# Patient Record
Sex: Female | Born: 1950
Health system: Southern US, Community
[De-identification: ages and names within clinical notes are randomized; demographics above are authoritative.]

## PROBLEM LIST (undated history)

## (undated) DIAGNOSIS — G4730 Sleep apnea, unspecified: Secondary | ICD-10-CM

## (undated) DIAGNOSIS — T7840XA Allergy, unspecified, initial encounter: Secondary | ICD-10-CM

## (undated) DIAGNOSIS — Z0000 Encounter for general adult medical examination without abnormal findings: Secondary | ICD-10-CM

## (undated) DIAGNOSIS — E7800 Pure hypercholesterolemia, unspecified: Secondary | ICD-10-CM

## (undated) DIAGNOSIS — T8859XA Other complications of anesthesia, initial encounter: Secondary | ICD-10-CM

## (undated) DIAGNOSIS — M1990 Unspecified osteoarthritis, unspecified site: Secondary | ICD-10-CM

## (undated) DIAGNOSIS — M109 Gout, unspecified: Secondary | ICD-10-CM

## (undated) DIAGNOSIS — R42 Dizziness and giddiness: Secondary | ICD-10-CM

## (undated) DIAGNOSIS — M810 Age-related osteoporosis without current pathological fracture: Secondary | ICD-10-CM

## (undated) DIAGNOSIS — R300 Dysuria: Secondary | ICD-10-CM

## (undated) DIAGNOSIS — N189 Chronic kidney disease, unspecified: Secondary | ICD-10-CM

## (undated) DIAGNOSIS — R9431 Abnormal electrocardiogram [ECG] [EKG]: Secondary | ICD-10-CM

## (undated) DIAGNOSIS — D509 Iron deficiency anemia, unspecified: Secondary | ICD-10-CM

## (undated) DIAGNOSIS — R011 Cardiac murmur, unspecified: Secondary | ICD-10-CM

## (undated) DIAGNOSIS — I10 Essential (primary) hypertension: Secondary | ICD-10-CM

## (undated) DIAGNOSIS — J309 Allergic rhinitis, unspecified: Secondary | ICD-10-CM

## (undated) DIAGNOSIS — K219 Gastro-esophageal reflux disease without esophagitis: Secondary | ICD-10-CM

## (undated) HISTORY — DX: Chronic kidney disease, unspecified: N18.9

## (undated) HISTORY — DX: Gastro-esophageal reflux disease without esophagitis: K21.9

## (undated) HISTORY — DX: Iron deficiency anemia, unspecified: D50.9

## (undated) HISTORY — DX: Unspecified osteoarthritis, unspecified site: M19.90

## (undated) HISTORY — DX: Allergy, unspecified, initial encounter: T78.40XA

## (undated) HISTORY — DX: Dysuria: R30.0

## (undated) HISTORY — DX: Pure hypercholesterolemia, unspecified: E78.00

## (undated) HISTORY — DX: Sleep apnea, unspecified: G47.30

## (undated) HISTORY — DX: Encounter for general adult medical examination without abnormal findings: Z00.00

## (undated) HISTORY — DX: Gout, unspecified: M10.9

## (undated) HISTORY — DX: Age-related osteoporosis without current pathological fracture: M81.0

## (undated) HISTORY — DX: Cardiac murmur, unspecified: R01.1

## (undated) HISTORY — DX: Essential (primary) hypertension: I10

## (undated) HISTORY — PX: ABDOMINAL HYSTERECTOMY: SHX81

## (undated) HISTORY — DX: Abnormal electrocardiogram (ECG) (EKG): R94.31

## (undated) HISTORY — PX: EYE SURGERY: SHX253

## (undated) HISTORY — DX: Dizziness and giddiness: R42

## (undated) HISTORY — DX: Allergic rhinitis, unspecified: J30.9

---

## 2000-02-03 ENCOUNTER — Ambulatory Visit (HOSPITAL_COMMUNITY): Admission: RE | Admit: 2000-02-03 | Discharge: 2000-02-03 | Payer: Self-pay | Admitting: Gastroenterology

## 2000-02-03 ENCOUNTER — Encounter: Payer: Self-pay | Admitting: Endocrinology

## 2000-05-24 ENCOUNTER — Encounter: Payer: Self-pay | Admitting: Internal Medicine

## 2000-05-24 ENCOUNTER — Encounter: Admission: RE | Admit: 2000-05-24 | Discharge: 2000-05-24 | Payer: Self-pay | Admitting: Internal Medicine

## 2000-09-23 ENCOUNTER — Other Ambulatory Visit: Admission: RE | Admit: 2000-09-23 | Discharge: 2000-09-23 | Payer: Self-pay | Admitting: Obstetrics and Gynecology

## 2000-10-13 ENCOUNTER — Encounter: Payer: Self-pay | Admitting: Obstetrics and Gynecology

## 2000-10-13 ENCOUNTER — Encounter: Admission: RE | Admit: 2000-10-13 | Discharge: 2000-10-13 | Payer: Self-pay | Admitting: Obstetrics and Gynecology

## 2001-01-01 ENCOUNTER — Emergency Department (HOSPITAL_COMMUNITY): Admission: EM | Admit: 2001-01-01 | Discharge: 2001-01-01 | Payer: Self-pay | Admitting: Emergency Medicine

## 2001-05-06 ENCOUNTER — Emergency Department (HOSPITAL_COMMUNITY): Admission: EM | Admit: 2001-05-06 | Discharge: 2001-05-06 | Payer: Self-pay | Admitting: Emergency Medicine

## 2002-06-16 ENCOUNTER — Emergency Department (HOSPITAL_COMMUNITY): Admission: EM | Admit: 2002-06-16 | Discharge: 2002-06-16 | Payer: Self-pay | Admitting: Emergency Medicine

## 2002-12-24 ENCOUNTER — Other Ambulatory Visit: Admission: RE | Admit: 2002-12-24 | Discharge: 2002-12-24 | Payer: Self-pay | Admitting: Obstetrics and Gynecology

## 2003-08-13 ENCOUNTER — Encounter: Payer: Self-pay | Admitting: Obstetrics and Gynecology

## 2003-08-13 ENCOUNTER — Encounter: Admission: RE | Admit: 2003-08-13 | Discharge: 2003-08-13 | Payer: Self-pay | Admitting: Obstetrics and Gynecology

## 2003-10-22 ENCOUNTER — Encounter: Admission: RE | Admit: 2003-10-22 | Discharge: 2003-10-22 | Payer: Self-pay | Admitting: Internal Medicine

## 2004-03-27 ENCOUNTER — Encounter: Admission: RE | Admit: 2004-03-27 | Discharge: 2004-03-27 | Payer: Self-pay | Admitting: Obstetrics and Gynecology

## 2004-06-25 ENCOUNTER — Other Ambulatory Visit: Admission: RE | Admit: 2004-06-25 | Discharge: 2004-06-25 | Payer: Self-pay | Admitting: Obstetrics and Gynecology

## 2004-08-04 ENCOUNTER — Emergency Department (HOSPITAL_COMMUNITY): Admission: EM | Admit: 2004-08-04 | Discharge: 2004-08-04 | Payer: Self-pay | Admitting: Family Medicine

## 2004-08-07 ENCOUNTER — Emergency Department (HOSPITAL_COMMUNITY): Admission: EM | Admit: 2004-08-07 | Discharge: 2004-08-07 | Payer: Self-pay | Admitting: Family Medicine

## 2005-06-02 ENCOUNTER — Ambulatory Visit: Payer: Self-pay | Admitting: Endocrinology

## 2005-06-09 ENCOUNTER — Ambulatory Visit: Payer: Self-pay | Admitting: Endocrinology

## 2005-06-18 ENCOUNTER — Ambulatory Visit: Payer: Self-pay | Admitting: Internal Medicine

## 2005-07-06 ENCOUNTER — Ambulatory Visit: Payer: Self-pay | Admitting: Endocrinology

## 2005-09-27 ENCOUNTER — Ambulatory Visit: Payer: Self-pay | Admitting: Endocrinology

## 2005-12-09 ENCOUNTER — Ambulatory Visit: Payer: Self-pay | Admitting: Internal Medicine

## 2006-03-11 ENCOUNTER — Encounter: Payer: Self-pay | Admitting: Endocrinology

## 2006-07-11 ENCOUNTER — Ambulatory Visit: Payer: Self-pay | Admitting: Endocrinology

## 2006-07-22 ENCOUNTER — Ambulatory Visit: Payer: Self-pay | Admitting: Endocrinology

## 2007-03-17 ENCOUNTER — Ambulatory Visit: Payer: Self-pay | Admitting: Endocrinology

## 2007-03-17 LAB — CONVERTED CEMR LAB
BUN: 18 mg/dL (ref 6–23)
CO2: 33 meq/L — ABNORMAL HIGH (ref 19–32)
Calcium: 9.4 mg/dL (ref 8.4–10.5)
Chloride: 101 meq/L (ref 96–112)
Creatinine, Ser: 1.2 mg/dL (ref 0.4–1.2)
GFR calc Af Amer: 60 mL/min
GFR calc non Af Amer: 50 mL/min
Glucose, Bld: 102 mg/dL — ABNORMAL HIGH (ref 70–99)
Potassium: 3 meq/L — ABNORMAL LOW (ref 3.5–5.1)
Sed Rate: 44 mm/h — ABNORMAL HIGH (ref 0–25)
Sodium: 142 meq/L (ref 135–145)
Uric Acid, Serum: 8.7 mg/dL — ABNORMAL HIGH (ref 2.4–7.0)

## 2007-04-18 ENCOUNTER — Ambulatory Visit: Payer: Self-pay | Admitting: Endocrinology

## 2007-07-28 ENCOUNTER — Encounter: Payer: Self-pay | Admitting: Endocrinology

## 2007-07-28 DIAGNOSIS — I10 Essential (primary) hypertension: Secondary | ICD-10-CM

## 2007-07-28 DIAGNOSIS — J309 Allergic rhinitis, unspecified: Secondary | ICD-10-CM | POA: Insufficient documentation

## 2007-07-28 DIAGNOSIS — M810 Age-related osteoporosis without current pathological fracture: Secondary | ICD-10-CM

## 2007-07-28 DIAGNOSIS — K219 Gastro-esophageal reflux disease without esophagitis: Secondary | ICD-10-CM

## 2008-10-03 ENCOUNTER — Telehealth (INDEPENDENT_AMBULATORY_CARE_PROVIDER_SITE_OTHER): Payer: Self-pay | Admitting: *Deleted

## 2008-11-01 ENCOUNTER — Ambulatory Visit: Payer: Self-pay | Admitting: Endocrinology

## 2008-11-01 ENCOUNTER — Telehealth (INDEPENDENT_AMBULATORY_CARE_PROVIDER_SITE_OTHER): Payer: Self-pay | Admitting: *Deleted

## 2008-11-01 DIAGNOSIS — E7800 Pure hypercholesterolemia, unspecified: Secondary | ICD-10-CM | POA: Insufficient documentation

## 2008-11-01 DIAGNOSIS — R9431 Abnormal electrocardiogram [ECG] [EKG]: Secondary | ICD-10-CM

## 2008-11-04 ENCOUNTER — Ambulatory Visit: Payer: Self-pay | Admitting: Endocrinology

## 2008-11-04 LAB — CONVERTED CEMR LAB
ALT: 20 units/L (ref 0–35)
AST: 25 units/L (ref 0–37)
Albumin: 3.7 g/dL (ref 3.5–5.2)
Alkaline Phosphatase: 46 units/L (ref 39–117)
BUN: 20 mg/dL (ref 6–23)
Basophils Absolute: 0 10*3/uL (ref 0.0–0.1)
Basophils Relative: 1.1 % (ref 0.0–3.0)
Bilirubin Urine: NEGATIVE
Bilirubin, Direct: 0.1 mg/dL (ref 0.0–0.3)
CO2: 32 meq/L (ref 19–32)
Calcium: 9.1 mg/dL (ref 8.4–10.5)
Chloride: 103 meq/L (ref 96–112)
Cholesterol: 217 mg/dL (ref 0–200)
Creatinine, Ser: 1.2 mg/dL (ref 0.4–1.2)
Direct LDL: 143.4 mg/dL
Eosinophils Absolute: 0.2 10*3/uL (ref 0.0–0.7)
Eosinophils Relative: 4.7 % (ref 0.0–5.0)
GFR calc Af Amer: 60 mL/min
GFR calc non Af Amer: 50 mL/min
Glucose, Bld: 85 mg/dL (ref 70–99)
HCT: 35.4 % — ABNORMAL LOW (ref 36.0–46.0)
HDL: 39.4 mg/dL (ref >39.0)
Hemoglobin, Urine: NEGATIVE
Hemoglobin: 11.9 g/dL — ABNORMAL LOW (ref 12.0–15.0)
Ketones, ur: NEGATIVE mg/dL
LDL Cholesterol: 154 mg/dL — ABNORMAL HIGH (ref 0–99)
Leukocytes, UA: NEGATIVE
Lymphocytes Relative: 50.9 % — ABNORMAL HIGH (ref 12.0–46.0)
MCHC: 33.7 g/dL (ref 30.0–36.0)
MCV: 84.7 fL (ref 78.0–100.0)
Monocytes Absolute: 0.4 10*3/uL (ref 0.1–1.0)
Monocytes Relative: 8 % (ref 3.0–12.0)
Neutro Abs: 1.6 10*3/uL (ref 1.4–7.7)
Neutrophils Relative %: 35.3 % — ABNORMAL LOW (ref 43.0–77.0)
Nitrite: NEGATIVE
Platelets: 345 10*3/uL (ref 150–400)
Potassium: 2.5 meq/L — CL (ref 3.5–5.1)
RBC: 4.18 M/uL (ref 3.87–5.11)
RDW: 14.2 % (ref 11.5–14.6)
Sodium: 143 meq/L (ref 135–145)
Specific Gravity, Urine: 1.015 (ref 1.000–1.03)
TSH: 0.49 microintl units/mL (ref 0.35–5.50)
Total Bilirubin: 0.5 mg/dL (ref 0.3–1.2)
Total CHOL/HDL Ratio: 5.5
Total Protein, Urine: NEGATIVE mg/dL
Total Protein: 7.3 g/dL (ref 6.0–8.3)
Triglycerides: 116 mg/dL (ref 0–149)
Urine Glucose: NEGATIVE mg/dL
Urobilinogen, UA: 0.2 (ref 0.0–1.0)
VLDL: 23 mg/dL (ref 0–40)
WBC: 4.5 10*3/uL (ref 4.5–10.5)
pH: 6.5 (ref 5.0–8.0)

## 2008-11-07 ENCOUNTER — Encounter: Payer: Self-pay | Admitting: Endocrinology

## 2008-11-07 DIAGNOSIS — D509 Iron deficiency anemia, unspecified: Secondary | ICD-10-CM

## 2008-11-07 LAB — CONVERTED CEMR LAB
Basophils Absolute: 0 10*3/uL (ref 0.0–0.1)
Basophils Relative: 0.5 % (ref 0.0–3.0)
Eosinophils Absolute: 0.2 10*3/uL (ref 0.0–0.7)
Eosinophils Relative: 3.3 % (ref 0.0–5.0)
HCT: 32.6 % — ABNORMAL LOW (ref 36.0–46.0)
Hemoglobin: 11 g/dL — ABNORMAL LOW (ref 12.0–15.0)
Iron: 60 ug/dL (ref 42–145)
Lymphocytes Relative: 47.4 % — ABNORMAL HIGH (ref 12.0–46.0)
MCHC: 33.8 g/dL (ref 30.0–36.0)
MCV: 84.9 fL (ref 78.0–100.0)
Monocytes Absolute: 0.4 10*3/uL (ref 0.1–1.0)
Monocytes Relative: 7.9 % (ref 3.0–12.0)
Neutro Abs: 2.3 10*3/uL (ref 1.4–7.7)
Neutrophils Relative %: 40.9 % — ABNORMAL LOW (ref 43.0–77.0)
Platelets: 307 10*3/uL (ref 150–400)
Potassium: 2.8 meq/L — ABNORMAL LOW (ref 3.5–5.1)
RBC: 3.84 M/uL — ABNORMAL LOW (ref 3.87–5.11)
RDW: 14.2 % (ref 11.5–14.6)
Vitamin B-12: 940 pg/mL — ABNORMAL HIGH (ref 211–911)
WBC: 5.6 10*3/uL (ref 4.5–10.5)

## 2009-07-18 ENCOUNTER — Ambulatory Visit: Payer: Self-pay | Admitting: Internal Medicine

## 2009-07-18 DIAGNOSIS — M1A09X0 Idiopathic chronic gout, multiple sites, without tophus (tophi): Secondary | ICD-10-CM

## 2009-07-21 ENCOUNTER — Telehealth: Payer: Self-pay | Admitting: Internal Medicine

## 2009-07-21 ENCOUNTER — Telehealth (INDEPENDENT_AMBULATORY_CARE_PROVIDER_SITE_OTHER): Payer: Self-pay | Admitting: *Deleted

## 2009-08-01 ENCOUNTER — Telehealth: Payer: Self-pay | Admitting: Endocrinology

## 2009-08-11 ENCOUNTER — Encounter (INDEPENDENT_AMBULATORY_CARE_PROVIDER_SITE_OTHER): Payer: Self-pay | Admitting: *Deleted

## 2009-08-11 ENCOUNTER — Ambulatory Visit: Payer: Self-pay | Admitting: Internal Medicine

## 2009-08-11 DIAGNOSIS — R300 Dysuria: Secondary | ICD-10-CM | POA: Insufficient documentation

## 2009-08-11 LAB — CONVERTED CEMR LAB
Glucose, Urine, Semiquant: NEGATIVE
Ketones, urine, test strip: NEGATIVE
Urobilinogen, UA: 0.2
pH: 5

## 2009-12-25 ENCOUNTER — Telehealth: Payer: Self-pay | Admitting: Endocrinology

## 2010-01-13 ENCOUNTER — Ambulatory Visit: Payer: Self-pay | Admitting: Endocrinology

## 2010-01-13 ENCOUNTER — Telehealth: Payer: Self-pay | Admitting: Endocrinology

## 2010-01-13 LAB — CONVERTED CEMR LAB
AST: 21 units/L (ref 0–37)
Albumin: 3.7 g/dL (ref 3.5–5.2)
BUN: 13 mg/dL (ref 6–23)
Basophils Absolute: 0.1 10*3/uL (ref 0.0–0.1)
CO2: 31 meq/L (ref 19–32)
Chloride: 106 meq/L (ref 96–112)
Cholesterol: 199 mg/dL (ref 0–200)
GFR calc non Af Amer: 59.56 mL/min (ref >60)
Glucose, Bld: 89 mg/dL (ref 70–99)
HCT: 33.5 % — ABNORMAL LOW (ref 36.0–46.0)
Hemoglobin: 11 g/dL — ABNORMAL LOW (ref 12.0–15.0)
Iron: 31 ug/dL — ABNORMAL LOW (ref 42–145)
Lymphs Abs: 2 10*3/uL (ref 0.7–4.0)
MCHC: 32.7 g/dL (ref 30.0–36.0)
MCV: 84.4 fL (ref 78.0–100.0)
Monocytes Absolute: 0.4 10*3/uL (ref 0.1–1.0)
Monocytes Relative: 8 % (ref 3.0–12.0)
Neutro Abs: 2.1 10*3/uL (ref 1.4–7.7)
Potassium: 3.4 meq/L — ABNORMAL LOW (ref 3.5–5.1)
RDW: 13.7 % (ref 11.5–14.6)
Saturation Ratios: 11.9 % — ABNORMAL LOW (ref 20.0–50.0)
Sodium: 144 meq/L (ref 135–145)
Specific Gravity, Urine: 1.015 (ref 1.000–1.030)
TSH: 1.01 microintl units/mL (ref 0.35–5.50)
Urine Glucose: NEGATIVE mg/dL
pH: 7 (ref 5.0–8.0)

## 2010-01-14 ENCOUNTER — Encounter: Payer: Self-pay | Admitting: Endocrinology

## 2010-01-14 LAB — CONVERTED CEMR LAB
Calcium, Total (PTH): 9.5 mg/dL (ref 8.4–10.5)
PTH: 110.2 pg/mL — ABNORMAL HIGH (ref 14.0–72.0)

## 2010-02-13 ENCOUNTER — Ambulatory Visit: Payer: Self-pay | Admitting: Endocrinology

## 2010-02-13 DIAGNOSIS — R42 Dizziness and giddiness: Secondary | ICD-10-CM

## 2010-02-13 LAB — CONVERTED CEMR LAB
Basophils Absolute: 0 10*3/uL (ref 0.0–0.1)
Basophils Relative: 0.7 % (ref 0.0–3.0)
Calcium: 10.6 mg/dL — ABNORMAL HIGH (ref 8.4–10.5)
GFR calc non Af Amer: 54.29 mL/min (ref >60)
Hemoglobin: 12.1 g/dL (ref 12.0–15.0)
Lymphocytes Relative: 39.3 % (ref 12.0–46.0)
Monocytes Relative: 5.8 % (ref 3.0–12.0)
Neutro Abs: 1.8 10*3/uL (ref 1.4–7.7)
RBC: 4.38 M/uL (ref 3.87–5.11)
Saturation Ratios: 23.1 % (ref 20.0–50.0)
Sodium: 141 meq/L (ref 135–145)
Uric Acid, Serum: 5.9 mg/dL (ref 2.4–7.0)
WBC: 3.7 10*3/uL — ABNORMAL LOW (ref 4.5–10.5)

## 2010-03-19 ENCOUNTER — Telehealth: Payer: Self-pay | Admitting: Endocrinology

## 2010-03-23 ENCOUNTER — Telehealth: Payer: Self-pay | Admitting: Endocrinology

## 2010-04-07 ENCOUNTER — Encounter: Payer: Self-pay | Admitting: Endocrinology

## 2010-04-10 ENCOUNTER — Encounter (INDEPENDENT_AMBULATORY_CARE_PROVIDER_SITE_OTHER): Payer: Self-pay | Admitting: *Deleted

## 2010-04-10 LAB — CONVERTED CEMR LAB
ALT: 14 units/L
Total Bilirubin: 0.2 mg/dL

## 2010-04-15 ENCOUNTER — Encounter (INDEPENDENT_AMBULATORY_CARE_PROVIDER_SITE_OTHER): Payer: Self-pay | Admitting: *Deleted

## 2010-06-18 ENCOUNTER — Telehealth (INDEPENDENT_AMBULATORY_CARE_PROVIDER_SITE_OTHER): Payer: Self-pay | Admitting: *Deleted

## 2011-01-10 ENCOUNTER — Encounter: Payer: Self-pay | Admitting: Obstetrics and Gynecology

## 2011-01-21 NOTE — Letter (Signed)
Summary: [facility]  [facility]   Imported By: Sherian Rein 04/16/2010 08:52:42  _____________________________________________________________________  External Attachment:    Type:   Image     Comment:   External Document

## 2011-01-21 NOTE — Progress Notes (Signed)
Summary: Paper chart  ---- Converted from flag ---- ---- 01/13/2010 3:45 PM, Minus Breeding MD wrote: paper chart please ? colonoscopy ------------------------------  Phone Note Other Incoming   Summary of Call: pts chart shows she had a bad experience with colonoscopy. Nothing documented saying she had one? Paper chart on MD's desk. Initial call taken by: Josph Macho CMA,  January 13, 2010 3:54 PM  Follow-up for Phone Call        i reviewed chart.  colonoscopy was done 02/03/00.  i have removed the report from paper chart, and signed for scanning. Follow-up by: Minus Breeding MD,  January 13, 2010 5:06 PM

## 2011-01-21 NOTE — Letter (Signed)
Summary: [facility]   Imported By: Lester [place] 08/13/2010 07:28:18  _____________________________________________________________________  External Attachment:    Type:   Image     Comment:   External Document

## 2011-01-21 NOTE — Assessment & Plan Note (Signed)
Summary: req ov it has been a while/cpx/cd   Vital Signs:  Patient profile:   59 year old female Height:      67 inches (170.18 cm) Weight:      203.50 pounds (92.50 kg) O2 Sat:      99 % on Room air Temp:     97.6 degrees F (36.44 degrees C) oral Pulse rate:   83 / minute BP sitting:   168 / 100  (left arm) Cuff size:   large  Vitals Entered By: Josph Macho CMA (January 13, 2010 3:08 PM)  O2 Flow:  Room air CC: Physical/ pt states she is no longer taking Colchicine or Tramadol/ CF Is Patient Diabetic? No   Primary Provider:  Minus Breeding MD  CC:  Physical/ pt states she is no longer taking Colchicine or Tramadol/ CF.  History of Present Illness: here for regular wellness examination.  she does not drink or smoke.   Current Medications (verified): 1)  Colchicine 0.6 Mg Tabs (Colchicine) .Marland Kitchen.. 1 By Mouth Every 2 Hours As Needed 2)  Tramadol Hcl 50 Mg Tabs (Tramadol Hcl) .Marland Kitchen.. 1 - 2 By Mouth Q 6 Hrs As Needed Pain 3)  Allopurinol 100 Mg Tabs (Allopurinol) .Marland Kitchen.. 1po Once Daily 4)  Atenolol 50 Mg Tabs (Atenolol) .Marland Kitchen.. 1 By Mouth Two Times A Day  Allergies (verified): 1)  ! Procardia  Family History: Reviewed history and no changes required. father had prostate cancer (deceased).  Social History: Reviewed history from 07/18/2009 and no changes required. work - Location manager Married  Review of Systems  The patient denies fever, weight loss, weight gain, vision loss, decreased hearing, syncope, dyspnea on exertion, abdominal pain, melena, hematochezia, severe indigestion/heartburn, hematuria, suspicious skin lesions, and depression.    Physical Exam  General:  normal appearance.   Neck:  Supple without thyroid enlargement or tenderness. No cervical lymphadenopathy Breasts:  sees gyn  Heart:  Regular rate and rhythm without murmurs or gallops noted. Normal S1,S2.   Abdomen:  abdomen is soft, nontender.  no hepatosplenomegaly.   not distended.  no  hernia  Rectal:  sees gyn  Genitalia:  sees gyn  Msk:  muscle bulk and strength are grossly normal.  no obvious joint swelling.  gait is normal and steady  Pulses:  dorsalis pedis intact bilat.  no carotid bruit  Extremities:  no deformity.  no ulcer on the feet.  feet are of normal color and temp.  no edema  Neurologic:  cn 2-12 grossly intact.   readily moves all 4's.   sensation is intact to touch on the feet  Skin:  normal texture and temp.  no rash.  not diaphoretic  Cervical Nodes:  No significant adenopathy.  Psych:  Alert and cooperative; normal mood and affect; normal attention span and concentration.   Additional Exam:  SEPARATE EVALUATION FOLLOWS--EACH PROBLEM HERE IS NEW, NOT RESPONDING TO TREATMENT, OR POSES SIGNIFICANT RISK TO THE PATIENT'S HEALTH: HISTORY OF THE PRESENT ILLNESS: pt does not take any cholesterol med anemia is again noted today pt states 1 month of nasal congestion, and associated prod cough and headache PAST MEDICAL HISTORY reviewed and up to date today REVIEW OF SYSTEMS: denies wheezing and chest pain PHYSICAL EXAMINATION: head: no deformity eyes: no periorbital swelling, no proptosis external nose and ears are normal mouth: no lesion seen eac's and tm's are red chest: clear to auscultation.  no respiratory distress LAB/XRAY RESULTS: Iron Saturation      [L]  11.9 %  Hemoglobin           [L]  11.0 g/dL                   16.1-09.6   Hematocrit           [L]  33.5 % LDL Cholesterol      [H]  045 mg/dL  IMPRESSION: dyslipidemia, needs increased rx uri fe-deficiency anemia PLAN: see instruction sheet   Impression & Recommendations:  Problem # 1:  ROUTINE GENERAL MEDICAL EXAM@HEALTH  CARE FACL (ICD-V70.0)  Medications Added to Medication List This Visit: 1)  Benzonatate 100 Mg Caps (Benzonatate) .Marland Kitchen.. 1 three times a day as needed cough 2)  Azithromycin 500 Mg Tabs (Azithromycin) .Marland Kitchen.. 1 qd 3)  Triamterene-hctz 37.5-25 Mg Tabs  (Triamterene-hctz) .... 1/2 qd 4)  Screening Mammogram   Other Orders: T-Parathyroid Hormone, Intact w/ Calcium (40981-19147) EKG w/ Interpretation (93000) TLB-Lipid Panel (80061-LIPID) TLB-BMP (Basic Metabolic Panel-BMET) (80048-METABOL) TLB-CBC Platelet - w/Differential (85025-CBCD) TLB-Hepatic/Liver Function Pnl (80076-HEPATIC) TLB-TSH (Thyroid Stimulating Hormone) (84443-TSH) TLB-IBC Pnl (Iron/FE;Transferrin) (83550-IBC) TLB-Uric Acid, Blood (84550-URIC) TLB-Udip w/ Micro (81001-URINE) Est. Patient Level IV (82956) Est. Patient 40-64 years ([address]08)  Patient Instructions: 1)  benzonatate 100 mg three times a day as needed cough 2)  azithromycin 500 mg once daily 3)  loratadine-d as needed congestion (non-prescription) 4)  take triamterene-hctz, 1/2 tab once daily. 5)  blood pressure check 30 days 6)  tests are being ordered for you today.  a few days after the test(s), please call [phone] to hear your test results. 7)  Please schedule a follow-up appointment in 1 year. 8)  here is a prescription for a mammogram. 9)  (update: i left message on phone-tree:  take iron tabs 2/day.  please consider cholesterol med--let me know if you decide to take) Prescriptions: SCREENING MAMMOGRAM   #0 x 0   Entered and Authorized by:   Minus Breeding MD   Signed by:   Minus Breeding MD on 01/13/2010   Method used:   Print then Give to Patient   RxID:   [id number] ATENOLOL 50 MG TABS (ATENOLOL) 1 by mouth two times a day  #60 x 11   Entered and Authorized by:   Minus Breeding MD   Signed by:   Minus Breeding MD on 01/13/2010   Method used:   [facility]  [address] [id number]       [address]       [place], [place]  [address]       Ph: [phone] or [phone]       Fax: [phone]   RxID:   [id number] ALLOPURINOL 100 MG TABS (ALLOPURINOL) 1po once daily  #30 x 11   Entered and Authorized by:   Minus Breeding MD   Signed by:    Minus Breeding MD on 01/13/2010   Method used:   [facility]        [facility]  [address] [id number]       [address]       [place], [place]  [address]       Ph: [phone] or [phone]       Fax: [phone]   RxID:   [id number] TRIAMTERENE-HCTZ 37.5-25 MG TABS (TRIAMTERENE-HCTZ) 1/2 qd  #30 x 5   Entered and Authorized by:  Minus Breeding MD   Signed by:   Minus Breeding MD on 01/13/2010   Method used:   [facility]        [facility]  [address] [facility]       [address]       [place], [place]  [address]       Ph: [phone] or [phone]       Fax: [phone]   RxID:   [id number] AZITHROMYCIN 500 MG TABS (AZITHROMYCIN) 1 qd  #6 x 0   Entered and Authorized by:   Minus Breeding MD   Signed by:   Minus Breeding MD on 01/13/2010   Method used:   [facility]        [facility]  [address] [id number]       [address]       [place], [place]  [address]       Ph: [phone] or [phone]       Fax: [phone]   RxID:   [id number] BENZONATATE 100 MG CAPS (BENZONATATE) 1 three times a day as needed cough  #30 x 1   Entered and Authorized by:   Minus Breeding MD   Signed by:   Minus Breeding MD on 01/13/2010   Method used:   [facility]        [facility]  [address] [id number]       [address]       [place], [place]  [address]       Ph: [phone] or [phone]       Fax: [phone]   RxID:   [id number]     Immunization History:  Influenza Immunization History:    Influenza:  historical (10/20/2009)    Preventive Care Screening     gyn is dr Pennie Rushing

## 2011-01-21 NOTE — Miscellaneous (Signed)
Summary: Labs   Clinical Lists Changes  Observations: Added new observation of TSH: 1.253 microintl units/mL (04/10/2010 11:34) Added new observation of ALK PHOS: 61 units/L (04/10/2010 11:34) Added new observation of BILI TOTAL: 0.2 mg/dL (56/21/3086 57:84) Added new observation of SGPT (ALT): 14 units/L (04/10/2010 11:34) Added new observation of SGOT (AST): 17 units/L (04/10/2010 11:34)      -  Date:  04/10/2010    AST: 17    ALT: 14    Total Bili: 0.2    Alk Phos: 61    TSH: 1.253

## 2011-01-21 NOTE — Procedures (Signed)
Summary: Colonoscopy/[facility]  Colonoscopy/[facility]   Imported By: Lester [place] 01/16/2010 15:05:31  _____________________________________________________________________  External Attachment:    Type:   Image     Comment:   External Document

## 2011-01-21 NOTE — Letter (Signed)
Summary: Out of Work  [facility]  [address] [place], [place] [address]   Phone: [phone]  Fax: [phone]    February 13, 2010   Employee:  Jocelyn Ford    To Whom It May Concern:   For Medical reasons, please excuse the above named employee from work for the following dates:  Start:   02/12/10  End:   02/16/10     Sincerely,    Minus Breeding MD

## 2011-01-21 NOTE — Progress Notes (Signed)
Summary: Nexium?  Phone Note Call from Patient Call back at Home Phone [phone]   Caller: Patient Summary of Call: pt called stating that she has been experiencing heartburn. Pt says that she used to use Nexium 40mg . pt is requesting a new Rx for this, please advise. Initial call taken by: Margaret Pyle, CMA,  March 19, 2010 11:21 AM  Follow-up for Phone Call        paper chart please  Follow-up by: Minus Breeding MD,  March 19, 2010 1:08 PM  Additional Follow-up for Phone Call Additional follow up Details #1::        chart ordered.Marland KitchenMarland KitchenMarland KitchenMargaret Pyle, CMA  March 19, 2010 1:26 PM     Additional Follow-up for Phone Call Additional follow up Details #2::    I called [facility] and the receptionist informed me that pt had a colonoscopy done on 03/11/2006. They are going to fax [place] the results. She also asked that when we talk to pt to have her call [facility] at [phone]. They have been trying to reach her to schedule another colonoscopy. Follow-up by: Josph Macho RMA,  March 19, 2010 2:33 PM  Additional Follow-up for Phone Call Additional follow up Details #3:: Details for Additional Follow-up Action Taken: i refilled x 1.  please see dr Loreta Ave to consider futher refills Additional Follow-up by: Minus Breeding MD,  March 19, 2010 3:29 PM  New/Updated Medications: NEXIUM 40 MG CPDR (ESOMEPRAZOLE MAGNESIUM) 1 once daily Prescriptions: NEXIUM 40 MG CPDR (ESOMEPRAZOLE MAGNESIUM) 1 once daily  #30 x 0   Entered and Authorized by:   Minus Breeding MD   Signed by:   Minus Breeding MD on 03/19/2010   Method used:   [facility]        [facility]  [address] [id number]       [address]       [place], [place]  [address]       Ph: [phone] or [phone]       Fax: [phone]   RxID:   [id number]    pt informed of Rx and to f/u with Dr. Loreta Ave.  Margaret Pyle, CMA  March 19, 2010 4:18 PM

## 2011-01-21 NOTE — Progress Notes (Signed)
Summary: ALLOPURINOL  Phone Note From Pharmacy   Summary of Call: Allopurinol-was approved 06/08/10, refills unlimited, exp 06/08/11 Initial call taken by: Dagoberto Reef,  June 18, 2010 9:09 AM

## 2011-01-21 NOTE — Assessment & Plan Note (Signed)
Summary: nausea--dizzy---stc   Vital Signs:  Patient profile:   60 year old female Height:      67 inches (170.18 cm) Weight:      198.50 pounds (90.23 kg) O2 Sat:      99 % on Room air Temp:     98.7 degrees F (37.06 degrees C) oral Pulse rate:   74 / minute BP sitting:   176 / 98  (left arm) Cuff size:   large  Vitals Entered By: Josph Macho RMA (February 13, 2010 8:39 AM)  O2 Flow:  Room air CC: Nausea and dizzy X2days/ CF Is Patient Diabetic? No   Primary Provider:  Minus Breeding MD  CC:  Nausea and dizzy X2days/ CF.  History of Present Illness: pt states few days of slight nausea, slight headache (worst at the bifrontal areas), and lightheadness.  shje also reports nasal congestion. she stopped the iron pills due to constipation. she takes the maxzide as rx'ed.  Current Medications (verified): 1)  Allopurinol 100 Mg Tabs (Allopurinol) .Marland Kitchen.. 1po Once Daily 2)  Atenolol 50 Mg Tabs (Atenolol) .Marland Kitchen.. 1 By Mouth Two Times A Day 3)  Benzonatate 100 Mg Caps (Benzonatate) .Marland Kitchen.. 1 Three Times A Day As Needed Cough 4)  Azithromycin 500 Mg Tabs (Azithromycin) .Marland Kitchen.. 1 Qd 5)  Triamterene-Hctz 37.5-25 Mg Tabs (Triamterene-Hctz) .... 1/2 Qd 6)  Screening Mammogram  Allergies (verified): 1)  ! Procardia  Past History:  Past Medical History: DIZZINESS (ICD-780.4) DYSURIA (ICD-788.1) ACUTE GOUTY ARTHROPATHY (ICD-274.01) ANEMIA, IRON DEFICIENCY (ICD-280.9) HYPERCHOLESTEROLEMIA (ICD-272.0) ROUTINE GENERAL MEDICAL EXAM@HEALTH  CARE FACL (ICD-V70.0) ELECTROCARDIOGRAM, ABNORMAL (myoview normal)(ICD-794.31) OSTEOPOROSIS (ICD-733.00) HYPERTENSION (ICD-401.9) GERD (ICD-530.81) ALLERGIC RHINITIS (ICD-477.9)  Review of Systems  The patient denies fever and syncope.    Physical Exam  General:  normal appearance.   Head:  head: no deformity eyes: no periorbital swelling, no proptosis external nose and ears are normal mouth: no lesion seen Additional Exam:  White Cell Count      [L]  3.7 K/uL                    4.5-10.5   Hemoglobin                12.1 g/dL                   16.1-09.6   Hematocrit                37.5 %                      36.0-46.0  Platelet Count            289.0 K/uL      Impression & Recommendations:  Problem # 1:  headache ? due to sinusitis  Problem # 2:  HYPERTENSION (ICD-401.9) ? exacerbated by current sxs.  Problem # 3:  ANEMIA, IRON DEFICIENCY (ICD-280.9) Assessment: Improved  Medications Added to Medication List This Visit: 1)  Cefuroxime Axetil 250 Mg Tabs (Cefuroxime axetil) .Marland Kitchen.. 1 two times a day  Other Orders: TLB-BMP (Basic Metabolic Panel-BMET) (80048-METABOL) TLB-CBC Platelet - w/Differential (85025-CBCD) TLB-IBC Pnl (Iron/FE;Transferrin) (83550-IBC) TLB-Uric Acid, Blood (84550-URIC) Est. Patient Level IV (04540)  Patient Instructions: 1)  blood tests today 2)  loratadine-d (non-prescription) as needed for congestion. 3)  cefuroxime 250 mg two times a day. 4)  tests are being ordered for you today.  a few days after the test(s), please call [phone] to hear your test results.  5)  (update: i left message on phone-tree:  rx as we discussed) Prescriptions: CEFUROXIME AXETIL 250 MG TABS (CEFUROXIME AXETIL) 1 two times a day  #14 x 0   Entered and Authorized by:   Minus Breeding MD   Signed by:   Minus Breeding MD on 02/13/2010   Method used:   [facility]        [facility]  [address] [id number]       [address]       [place], [place]  [address]       Ph: [phone] or [phone]       Fax: [phone]   RxID:   [id number]

## 2011-01-21 NOTE — Progress Notes (Signed)
Summary: Nexium PA  Phone Note From Pharmacy   Summary of Call: Nexium PA paperwork was rec'd this AM. Awaiting MD completion. Initial call taken by: Lucious Groves,  March 23, 2010 9:30 AM  Follow-up for Phone Call        forms completed and faxed. will await reply. Follow-up by: Lucious Groves,  March 30, 2010 4:43 PM     Appended Document: Nexium PA Per MD this was sent back to pharmacy to be sent to GI MD--Dr. Loreta Ave.

## 2011-01-21 NOTE — Progress Notes (Signed)
Summary: med refill  Phone Note Refill Request Message from:  Fax from Pharmacy on December 25, 2009 8:09 AM  Refills Requested: Medication #1:  ATENOLOL 50 MG TABS 1 by mouth two times a day. # 60   [phone]   Method Requested: Electronic Initial call taken by: Orlan Leavens,  December 25, 2009 8:09 AM  Follow-up for Phone Call        Refill? Looks like Dr Felicity Coyer filled 08/11/09. Please advise? Follow-up by: Josph Macho CMA,  December 25, 2009 8:38 AM  Additional Follow-up for Phone Call Additional follow up Details #1::        please refill prn Additional Follow-up by: Minus Breeding MD,  December 25, 2009 8:56 AM    Prescriptions: ATENOLOL 50 MG TABS (ATENOLOL) 1 by mouth two times a day  #60 x 2   Entered by:   Josph Macho CMA   Authorized by:   Minus Breeding MD   Signed by:   Josph Macho CMA on 12/25/2009   Method used:   [facility]        [facility]  [address] [id number]       [address]       [place], [place]  [address]       Ph: [phone] or [phone]       Fax: [phone]   RxID:   [id number]

## 2011-01-21 NOTE — Procedures (Signed)
Summary: Anselmo Rod MD  Anselmo Rod MD   Imported By: Lester [place] 03/24/2010 10:31:01  _____________________________________________________________________  External Attachment:    Type:   Image     Comment:   External Document

## 2011-02-02 ENCOUNTER — Ambulatory Visit: Payer: Self-pay | Admitting: Endocrinology

## 2011-03-17 ENCOUNTER — Other Ambulatory Visit: Payer: Self-pay

## 2011-03-17 MED ORDER — ALLOPURINOL 100 MG PO TABS: 100.0000 mg | ORAL_TABLET | Freq: Every day | ORAL | Status: DC

## 2011-03-17 MED ORDER — ATENOLOL 50 MG PO TABS: 50.0000 mg | ORAL_TABLET | Freq: Two times a day (BID) | ORAL | Status: DC

## 2011-03-17 NOTE — Telephone Encounter (Signed)
Pt has appt scheduled April 23rd 2012

## 2011-04-08 DIAGNOSIS — R9431 Abnormal electrocardiogram [ECG] [EKG]: Secondary | ICD-10-CM | POA: Insufficient documentation

## 2011-04-12 ENCOUNTER — Ambulatory Visit (INDEPENDENT_AMBULATORY_CARE_PROVIDER_SITE_OTHER): Payer: BC Managed Care – PPO | Admitting: Endocrinology

## 2011-04-12 ENCOUNTER — Other Ambulatory Visit (INDEPENDENT_AMBULATORY_CARE_PROVIDER_SITE_OTHER): Payer: BC Managed Care – PPO

## 2011-04-12 ENCOUNTER — Other Ambulatory Visit (INDEPENDENT_AMBULATORY_CARE_PROVIDER_SITE_OTHER): Payer: BC Managed Care – PPO | Admitting: Endocrinology

## 2011-04-12 ENCOUNTER — Encounter: Payer: Self-pay | Admitting: Endocrinology

## 2011-04-12 DIAGNOSIS — R42 Dizziness and giddiness: Secondary | ICD-10-CM

## 2011-04-12 DIAGNOSIS — I10 Essential (primary) hypertension: Secondary | ICD-10-CM

## 2011-04-12 DIAGNOSIS — M2550 Pain in unspecified joint: Secondary | ICD-10-CM

## 2011-04-12 DIAGNOSIS — D509 Iron deficiency anemia, unspecified: Secondary | ICD-10-CM

## 2011-04-12 DIAGNOSIS — Z79899 Other long term (current) drug therapy: Secondary | ICD-10-CM

## 2011-04-12 DIAGNOSIS — E7800 Pure hypercholesterolemia, unspecified: Secondary | ICD-10-CM

## 2011-04-12 DIAGNOSIS — E785 Hyperlipidemia, unspecified: Secondary | ICD-10-CM

## 2011-04-12 DIAGNOSIS — M109 Gout, unspecified: Secondary | ICD-10-CM

## 2011-04-12 LAB — URIC ACID: Uric Acid, Serum: 7 mg/dL (ref 2.4–7.0)

## 2011-04-12 LAB — BASIC METABOLIC PANEL
BUN: 22 mg/dL (ref 6–23)
Chloride: 97 mEq/L (ref 96–112)
Creatinine, Ser: 1.2 mg/dL (ref 0.4–1.2)
GFR: 59.88 mL/min — ABNORMAL LOW (ref >60.00)
Glucose, Bld: 84 mg/dL (ref 70–99)

## 2011-04-12 LAB — CBC WITH DIFFERENTIAL/PLATELET
Basophils Absolute: 0 10*3/uL (ref 0.0–0.1)
Eosinophils Absolute: 0.2 10*3/uL (ref 0.0–0.7)
HCT: 34.6 % — ABNORMAL LOW (ref 36.0–46.0)
Hemoglobin: 11.5 g/dL — ABNORMAL LOW (ref 12.0–15.0)
Lymphs Abs: 1.8 10*3/uL (ref 0.7–4.0)
MCHC: 33.2 g/dL (ref 30.0–36.0)
MCV: 85.2 fl (ref 78.0–100.0)
Neutro Abs: 3.2 10*3/uL (ref 1.4–7.7)
RDW: 14.6 % (ref 11.5–14.6)

## 2011-04-12 LAB — HEPATIC FUNCTION PANEL
ALT: 20 U/L (ref 0–35)
Alkaline Phosphatase: 59 U/L (ref 39–117)
Bilirubin, Direct: 0 mg/dL (ref 0.0–0.3)
Total Bilirubin: 0.2 mg/dL — ABNORMAL LOW (ref 0.3–1.2)
Total Protein: 7.8 g/dL (ref 6.0–8.3)

## 2011-04-12 MED ORDER — ATENOLOL 25 MG PO TABS: 25.0000 mg | ORAL_TABLET | Freq: Two times a day (BID) | ORAL | Status: DC

## 2011-04-12 NOTE — Patient Instructions (Addendum)
blood tests are being ordered for you today.  please call [phone] to hear your test results. pending the test results, please reduce the atenolol to 25 mg 2x a day. Please make an appointment for a regular physical. (update: i left message on phone-tree:  i called pt, and left message on ans mach:  Start kcl 20/d, and fe 1/d. You should consider chol med).

## 2011-04-12 NOTE — Progress Notes (Signed)
  Subjective:    Patient ID: Jocelyn Ford, female    DOB: [birth year], 60 y.o.   MRN: [id number]  HPI pt states she feels well in general, except for fatigue and myalgias.  She has few mos of pain at the elbows, lower back, and knees.  No assoc numbness. Past Medical History  Diagnosis Date  . Dizziness and giddiness   . Dysuria   . Acute gouty arthropathy   . Iron deficiency anemia, unspecified   . Pure hypercholesterolemia   . Routine general medical examination at a health care facility   . Nonspecific abnormal electrocardiogram (ECG) (EKG)   . Osteoporosis, unspecified   . Unspecified essential hypertension   . Esophageal reflux   . Allergic rhinitis, cause unspecified    Past Surgical History  Procedure Date  . Abdominal hysterectomy     reports that she has never smoked. She does not have any smokeless tobacco history on file. Her alcohol and drug histories not on file. family history includes Cancer in her father. Allergies  Allergen Reactions  . Nifedipine     REACTION: Nausea,weakness    Review of Systems Denies fever.  She has weight gain.    Objective:   Physical Exam GENERAL: no distress Elbows seem normal to me.    Lab Results  Component Value Date   WBC 5.7 04/12/2011   HGB 11.5* 04/12/2011   HCT 34.6* 04/12/2011   PLT 337.0 04/12/2011   CHOL 218* 04/12/2011   TRIG 131.0 04/12/2011   HDL 51.50 04/12/2011   LDLDIRECT 157.3 04/12/2011   ALT 20 04/12/2011   AST 28 04/12/2011   NA 140 04/12/2011   K 2.7* 04/12/2011   CL 97 04/12/2011   CREATININE 1.2 04/12/2011   BUN 22 04/12/2011   CO2 29 04/12/2011   TSH 0.97 04/12/2011    Assessment & Plan:  Hypokalemia, needs increased rx Dyslipidemia, needs increased rx fe-deficiency anemia, needs increased rx Fatigue.  This may be improved with breaking up atenolol to bid

## 2011-04-13 LAB — SEDIMENTATION RATE: Sed Rate: 40 mm/hr — ABNORMAL HIGH (ref 0–22)

## 2011-04-13 LAB — RHEUMATOID FACTOR: Rhuematoid fact SerPl-aCnc: <10 IU/mL (ref <=14)

## 2011-04-13 LAB — TSH: TSH: 0.97 u[IU]/mL (ref 0.35–5.50)

## 2011-04-13 MED ORDER — POTASSIUM CHLORIDE CRYS ER 20 MEQ PO TBCR: 20.0000 meq | EXTENDED_RELEASE_TABLET | Freq: Every day | ORAL | Status: DC

## 2011-05-07 NOTE — Procedures (Signed)
[place]  Patient:    Jocelyn Ford, Jocelyn Ford                    MRN: [id number] Proc. Date: 02/03/00 Adm. Date:  [id number] Attending:  Charna Elizabeth CC:         Lind Guest. August Saucer, M.D.                           Procedure Report  DATE OF BIRTH:  1951/10/18.  REFERRING PHYSICIAN:  Eric L. August Saucer, M.D.  PROCEDURE:  Colonoscopy.  ENDOSCOPIST:  Anselmo Rod, M.D.  INSTRUMENTS:  Olympus video colonoscope.  INDICATION:  A 60 year old black female with blood in stool and family history f polyps.  Rule out masses, polyps, erosions, ulcerations, etc.  INFORMED CONSENT:  Informed consent was procured from the patient.  The patient was fasted for eight hours prior to the procedure and prepped with a bottle of magnesium citrate and a gallon of NuLytely the night prior to the procedure.  PHYSICAL EXAMINATION:  VITAL SIGNS:  Stable.  NECK:  Supple.  CHEST:  Clear to auscultation.  HEART:  S1 and S2 regular.  ABDOMEN:  Soft with normal abdominal bowel sounds.  DESCRIPTION OF PROCEDURE:  The patient was placed in the left lateral decubitus  position and sedated with 55 mg of Demerol and 6 mg of Versed intravenously. Once the patient was adequately sedated, maintained on low flow oxygen, and continuous cardiac monitoring, the Olympus video colonoscope was advanced from the rectum o the cecum with slight difficulty secondary to solid stool in the left colon.  No masses, polyps, erosions, ulcerations were seen; however, very small lesions may have been missed secondary to relatively poor prep.  The patient tolerated the procedure well without complication.  IMPRESSION:  Outpatient follow-up is advised for close follow-up and further recommendations. DD:  02/03/00 TD:  02/03/00 Job: [id number]

## 2011-05-10 ENCOUNTER — Telehealth: Payer: Self-pay

## 2011-05-10 ENCOUNTER — Other Ambulatory Visit: Payer: Self-pay | Admitting: Endocrinology

## 2011-05-10 NOTE — Telephone Encounter (Signed)
Pt advised via Triage 04/26

## 2011-05-10 NOTE — Telephone Encounter (Signed)
Rx Done . 

## 2011-05-10 NOTE — Telephone Encounter (Signed)
Message copied by Margaret Pyle on Mon May 10, 2011  3:08 PM ------      Message from: Cristy Hilts      Created: Fri May 07, 2011  6:30 PM                   ----- Message -----         From: Sheffield Slider, RN         Sent: 05/03/2011  11:46 AM           To: Jacelyn Pi, RN                        ----- Message -----         From: Minus Breeding, MD         Sent: 04/13/2011   7:58 AM           To: Doristine Devoid, CMA            please leave message on phone tree--normal

## 2011-06-07 ENCOUNTER — Other Ambulatory Visit: Payer: Self-pay | Admitting: Endocrinology

## 2011-07-08 ENCOUNTER — Ambulatory Visit (INDEPENDENT_AMBULATORY_CARE_PROVIDER_SITE_OTHER): Payer: BC Managed Care – PPO | Admitting: Endocrinology

## 2011-07-08 ENCOUNTER — Encounter: Payer: Self-pay | Admitting: Endocrinology

## 2011-07-08 DIAGNOSIS — J309 Allergic rhinitis, unspecified: Secondary | ICD-10-CM

## 2011-07-08 MED ORDER — FLUTICASONE PROPIONATE 50 MCG/ACT NA SUSP: 2.0000 | Freq: Every day | NASAL | Status: DC

## 2011-07-08 MED ORDER — LOSARTAN POTASSIUM 50 MG PO TABS: 50.0000 mg | ORAL_TABLET | Freq: Every day | ORAL | Status: DC

## 2011-07-08 NOTE — Progress Notes (Signed)
Subjective:    Patient ID: Jocelyn Ford, female    DOB: 1951-10-11, 60 y.o.   MRN: [id number]  HPI Pt states 2 days of moderate pain at the right foot, and slight assoc numbness. She now takes kcl and fe 1/d. She also has 1 month of slight soreness in the throat, and assoc nasal congestion Past Medical History  Diagnosis Date  . Dizziness and giddiness   . Dysuria   . Acute gouty arthropathy   . Iron deficiency anemia, unspecified   . Pure hypercholesterolemia   . Routine general medical examination at a health care facility   . Nonspecific abnormal electrocardiogram (ECG) (EKG)   . Osteoporosis, unspecified   . Unspecified essential hypertension   . Esophageal reflux   . Allergic rhinitis, cause unspecified    Past Surgical History  Procedure Date  . Abdominal hysterectomy    History   Social History  . Marital Status: Divorced    Spouse Name: N/A    Number of Children: N/A  . Years of Education: N/A   Occupational History  . Not on file.   Social History Main Topics  . Smoking status: Never Smoker   . Smokeless tobacco: Not on file  . Alcohol Use: Not on file  . Drug Use: Not on file  . Sexually Active: Not on file   Other Topics Concern  . Not on file   Social History Narrative  . No narrative on file    Current Outpatient Prescriptions on File Prior to Visit  Medication Sig Dispense Refill  . allopurinol (ZYLOPRIM) 100 MG tablet TAKE 1 TABLET (100 MG TOTAL) BY MOUTH DAILY.  30 tablet  5  . atenolol (TENORMIN) 25 MG tablet Take 1 tablet (25 mg total) by mouth 2 (two) times daily.  60 tablet  11  . potassium chloride SA (K-DUR,KLOR-CON) 20 MEQ tablet Take 1 tablet (20 mEq total) by mouth daily.  30 tablet  11  . esomeprazole (NEXIUM) 40 MG capsule Take 40 mg by mouth daily before breakfast.          Allergies  Allergen Reactions  . Nifedipine     REACTION: Nausea,weakness    Family History  Problem Relation Age of Onset  . Cancer Father    Prostate    BP 130/78  Pulse 77  Temp(Src) 98.9 F (37.2 C) (Oral)  Ht 5\' 7"  (1.702 m)  Wt 210 lb 3.2 oz (95.346 kg)  BMI 32.92 kg/m2  SpO2 98%   Review of Systems  Constitutional:       Weight change  HENT: Negative for ear pain.   Eyes: Negative for visual disturbance.  Respiratory: Negative for shortness of breath.   Cardiovascular: Negative for chest pain.  Gastrointestinal: Negative for anal bleeding.  Genitourinary: Negative for hematuria.  Skin: Negative for rash.  Neurological: Negative for syncope.  Psychiatric/Behavioral: Negative for dysphoric mood.  Denies fever, but she has slight right foot swelling.      Objective:   Physical Exam GENERAL: no distress Right foot.  There is slight swelling at the dorsal aspect, but no erythema/warmth/tenderness. head: no deformity eyes: no periorbital swelling, no proptosis external nose and ears are normal mouth: no lesion seen Both eac's and tm's are normal    Assessment & Plan:  Foot pain, new--? Due to gout Htn, well-controlled, but hctz can exac gout Allergic rhinitis, worse Hypokalemia.  In view of the fact that she has needed this with dyazide, says she will prob  needs with cozaar fe-deficiency, compliant with rx.

## 2011-07-08 NOTE — Patient Instructions (Addendum)
change the triamterene-hctz to losartan 50 mg daily. Please make an appointment for a regular physical. If you get leg swelling on this, i'll prescribe you a different water pill.   Try loratadine-d (non-prescription) as needed for congestion.   i have sent a prescription to your pharmacy, for a steroid nasal spray. Please schedule a regular physical.

## 2011-07-14 ENCOUNTER — Telehealth: Payer: Self-pay

## 2011-07-14 MED ORDER — FUROSEMIDE 20 MG PO TABS: 20.0000 mg | ORAL_TABLET | Freq: Two times a day (BID) | ORAL | Status: DC

## 2011-07-14 NOTE — Telephone Encounter (Signed)
Pt called stating she is experiencing leg swelling since she was switched to Cozaar. Pt is requesting alternative medication as discussed at OV.

## 2011-07-14 NOTE — Telephone Encounter (Signed)
Pt advised of Rx/pharmacy 

## 2011-07-14 NOTE — Telephone Encounter (Signed)
rx sent

## 2011-07-21 ENCOUNTER — Telehealth: Payer: Self-pay

## 2011-07-21 NOTE — Telephone Encounter (Signed)
Per pt Lasix---Can this med be reduced--med is causing cramping--Ph#  [phone]

## 2011-07-22 NOTE — Telephone Encounter (Signed)
Pt called stating she believed Lasix to be causing dizziness and cramping. Pt decreased medication to once daily today and says she feel much better. Pt is requesting MD advise if OK to continue with lower frequency?

## 2011-07-22 NOTE — Telephone Encounter (Signed)
Left message on machine for pt to return my call  

## 2011-07-22 NOTE — Telephone Encounter (Signed)
Pt advised.

## 2011-07-22 NOTE — Telephone Encounter (Signed)
Ok.  i changed med list

## 2011-08-17 ENCOUNTER — Telehealth: Payer: Self-pay | Admitting: Endocrinology

## 2011-08-17 DIAGNOSIS — I10 Essential (primary) hypertension: Secondary | ICD-10-CM

## 2011-08-17 DIAGNOSIS — M810 Age-related osteoporosis without current pathological fracture: Secondary | ICD-10-CM

## 2011-08-17 DIAGNOSIS — Z79899 Other long term (current) drug therapy: Secondary | ICD-10-CM

## 2011-08-17 DIAGNOSIS — E7800 Pure hypercholesterolemia, unspecified: Secondary | ICD-10-CM

## 2011-08-17 DIAGNOSIS — D509 Iron deficiency anemia, unspecified: Secondary | ICD-10-CM

## 2011-08-17 DIAGNOSIS — M109 Gout, unspecified: Secondary | ICD-10-CM

## 2011-08-17 DIAGNOSIS — K219 Gastro-esophageal reflux disease without esophagitis: Secondary | ICD-10-CM

## 2011-08-17 NOTE — Telephone Encounter (Signed)
i ordered

## 2011-08-19 ENCOUNTER — Other Ambulatory Visit: Payer: Self-pay | Admitting: Endocrinology

## 2011-08-19 ENCOUNTER — Other Ambulatory Visit (INDEPENDENT_AMBULATORY_CARE_PROVIDER_SITE_OTHER): Payer: BC Managed Care – PPO

## 2011-08-19 ENCOUNTER — Ambulatory Visit (INDEPENDENT_AMBULATORY_CARE_PROVIDER_SITE_OTHER): Payer: BC Managed Care – PPO | Admitting: Endocrinology

## 2011-08-19 ENCOUNTER — Encounter: Payer: Self-pay | Admitting: Endocrinology

## 2011-08-19 VITALS — BP 126/80 | HR 84 | Temp 98.9°F | Ht 67.0 in | Wt 209.4 lb

## 2011-08-19 DIAGNOSIS — M109 Gout, unspecified: Secondary | ICD-10-CM

## 2011-08-19 DIAGNOSIS — Z79899 Other long term (current) drug therapy: Secondary | ICD-10-CM

## 2011-08-19 DIAGNOSIS — D509 Iron deficiency anemia, unspecified: Secondary | ICD-10-CM

## 2011-08-19 DIAGNOSIS — E7800 Pure hypercholesterolemia, unspecified: Secondary | ICD-10-CM

## 2011-08-19 DIAGNOSIS — Z0000 Encounter for general adult medical examination without abnormal findings: Secondary | ICD-10-CM

## 2011-08-19 DIAGNOSIS — K219 Gastro-esophageal reflux disease without esophagitis: Secondary | ICD-10-CM

## 2011-08-19 DIAGNOSIS — I10 Essential (primary) hypertension: Secondary | ICD-10-CM

## 2011-08-19 DIAGNOSIS — M810 Age-related osteoporosis without current pathological fracture: Secondary | ICD-10-CM

## 2011-08-19 LAB — VITAMIN B12: Vitamin B-12: 521 pg/mL (ref 211–911)

## 2011-08-19 LAB — BASIC METABOLIC PANEL
Calcium: 9.3 mg/dL (ref 8.4–10.5)
GFR: 55.99 mL/min — ABNORMAL LOW (ref >60.00)
Potassium: 3.2 mEq/L — ABNORMAL LOW (ref 3.5–5.1)
Sodium: 142 mEq/L (ref 135–145)

## 2011-08-19 LAB — URINALYSIS, ROUTINE W REFLEX MICROSCOPIC
Ketones, ur: NEGATIVE
Specific Gravity, Urine: 1.015 (ref 1.000–1.030)
Total Protein, Urine: NEGATIVE
Urine Glucose: NEGATIVE
pH: 6 (ref 5.0–8.0)

## 2011-08-19 LAB — CBC WITH DIFFERENTIAL/PLATELET
Basophils Absolute: 0 10*3/uL (ref 0.0–0.1)
Eosinophils Absolute: 0.2 10*3/uL (ref 0.0–0.7)
Eosinophils Relative: 5.1 % — ABNORMAL HIGH (ref 0.0–5.0)
MCV: 84.7 fl (ref 78.0–100.0)
Monocytes Absolute: 0.5 10*3/uL (ref 0.1–1.0)
Neutrophils Relative %: 46.4 % (ref 43.0–77.0)
Platelets: 317 10*3/uL (ref 150.0–400.0)
RDW: 14.5 % (ref 11.5–14.6)
WBC: 4.8 10*3/uL (ref 4.5–10.5)

## 2011-08-19 LAB — URIC ACID: Uric Acid, Serum: 6.5 mg/dL (ref 2.4–7.0)

## 2011-08-19 LAB — TSH: TSH: 0.84 u[IU]/mL (ref 0.35–5.50)

## 2011-08-19 LAB — HEPATIC FUNCTION PANEL
AST: 18 U/L (ref 0–37)
Albumin: 4.2 g/dL (ref 3.5–5.2)

## 2011-08-19 LAB — LIPID PANEL
Cholesterol: 225 mg/dL — ABNORMAL HIGH (ref 0–200)
VLDL: 23.8 mg/dL (ref 0.0–40.0)

## 2011-08-19 LAB — IBC PANEL: Iron: 53 ug/dL (ref 42–145)

## 2011-08-19 MED ORDER — OMEPRAZOLE 40 MG PO CPDR: 40.0000 mg | DELAYED_RELEASE_CAPSULE | Freq: Every day | ORAL | Status: DC

## 2011-08-19 MED ORDER — POTASSIUM CHLORIDE CRYS ER 20 MEQ PO TBCR: EXTENDED_RELEASE_TABLET | ORAL | Status: DC

## 2011-08-19 NOTE — Progress Notes (Signed)
Subjective:    Patient ID: Jocelyn Ford, female    DOB: Oct 16, 1951, 60 y.o.   MRN: [id number]  HPI here for regular wellness examination.  she's feeling pretty well in general, and says chronic med probs are stable, except as noted below Past Medical History  Diagnosis Date  . Dizziness and giddiness   . Dysuria   . Acute gouty arthropathy   . Iron deficiency anemia, unspecified   . Pure hypercholesterolemia   . Routine general medical examination at a health care facility   . Nonspecific abnormal electrocardiogram (ECG) (EKG)   . Osteoporosis, unspecified   . Unspecified essential hypertension   . Esophageal reflux   . Allergic rhinitis, cause unspecified     Past Surgical History  Procedure Date  . Abdominal hysterectomy     History   Social History  . Marital Status: Divorced    Spouse Name: N/A    Number of Children: N/A  . Years of Education: N/A   Occupational History  . Not on file.   Social History Main Topics  . Smoking status: Never Smoker   . Smokeless tobacco: Not on file  . Alcohol Use: Not on file  . Drug Use: Not on file  . Sexually Active: Not on file   Other Topics Concern  . Not on file   Social History Narrative  . No narrative on file    Current Outpatient Prescriptions on File Prior to Visit  Medication Sig Dispense Refill  . allopurinol (ZYLOPRIM) 100 MG tablet TAKE 1 TABLET (100 MG TOTAL) BY MOUTH DAILY.  30 tablet  5  . atenolol (TENORMIN) 25 MG tablet Take 1 tablet (25 mg total) by mouth 2 (two) times daily.  60 tablet  11  . fluticasone (FLONASE) 50 MCG/ACT nasal spray Place 2 sprays into the nose daily.  16 g  12  . furosemide (LASIX) 20 MG tablet Take 20 mg by mouth daily.        Marland Kitchen losartan (COZAAR) 50 MG tablet Take 1 tablet (50 mg total) by mouth daily.  30 tablet  5    Allergies  Allergen Reactions  . Nifedipine     REACTION: Nausea,weakness    Family History  Problem Relation Age of Onset  . Cancer Father    Prostate    BP 126/80  Pulse 84  Temp(Src) 98.9 F (37.2 C) (Oral)  Ht 5\' 7"  (1.702 m)  Wt 209 lb 6.4 oz (94.983 kg)  BMI 32.80 kg/m2  SpO2 97%     Review of Systems  Constitutional: Negative for fever.  HENT: Negative for hearing loss.   Eyes: Negative for visual disturbance.  Respiratory: Negative for shortness of breath.   Cardiovascular: Negative for chest pain.  Gastrointestinal: Negative for blood in stool.  Genitourinary: Negative for hematuria.  Skin: Negative for rash.  Neurological: Negative for syncope.  Hematological: Does not bruise/bleed easily.  Psychiatric/Behavioral: Negative for dysphoric mood. The patient is not nervous/anxious.        Objective:   Physical Exam VS: see vs page GEN: no distress HEAD: head: no deformity eyes: no periorbital swelling, no proptosis external nose and ears are normal mouth: no lesion seen NECK: supple, thyroid is not enlarged CHEST WALL: no deformity BREASTS:  sees gyn ABD: abdomen is soft, nontender.  no hepatosplenomegaly.  not distended.  no hernia GENITALIA:  sees gyn. RECTAL: sees gyn MUSCULOSKELETAL: muscle bulk and strength are grossly normal.  no obvious joint swelling.  gait is  normal and steady EXTEMITIES: no deformity.  no ulcer on the feet.  feet are of normal color and temp.  no edema PULSES: dorsalis pedis intact bilat.  no carotid bruit NEURO:  cn 2-12 grossly intact.   readily moves all 4's.  sensation is intact to touch on the feet SKIN:  Normal texture and temperature.  No rash or suspicious lesion is visible.   NODES:  None palpable at the neck PSYCH: alert, oriented x3.  Does not appear anxious nor depressed.    Assessment & Plan:  Wellness visit today, with problems stable, except as noted.    SEPARATE EVALUATION FOLLOWS--EACH PROBLEM HERE IS NEW, NOT RESPONDING TO TREATMENT, OR POSES SIGNIFICANT RISK TO THE PATIENT'S HEALTH: HISTORY OF THE PRESENT ILLNESS:   Pt reports a few mos of muscle  cramps throughout the body, and arthralgias. PAST MEDICAL HISTORY reviewed and up to date today REVIEW OF SYSTEMS: Denies weight change and headache PHYSICAL EXAMINATION: VITAL SIGNS:  See vs page GENERAL: no distress HEART: Regular rate and rhythm without murmurs noted. Normal S1,S2.   LAB/XRAY RESULTS: (i reviewed cbc, fe panel, bmet, and lipids) IMPRESSION: Hypokalemia, needs increased rx fe-deficiency anemia, needs increased rx Dyslipidemia, needs increased rx Muscle cramps.  Could be related to hypokalemia PLAN: See instruction page

## 2011-08-19 NOTE — Patient Instructions (Addendum)
Please see dr Pennie Rushing for a routine appointment.  You should also have a bone-density x-ray. Change nexium to generic omeprazole 40 mg daily.  i have sent a prescription to your pharmacy. please consider these measures for your health:  minimize alcohol.  do not use tobacco products.  have a colonoscopy at least every 10 years from age 57.  Women should have an annual mammogram from age 89.  keep firearms safely stored.  always use seat belts.  have working smoke alarms in your home.  see an eye doctor and dentist regularly.  never drive under the influence of alcohol or drugs (including prescription drugs).   please let me know what your wishes would be, if artificial life support measures should become necessary.  it is critically important to prevent falling down (keep floor areas well-lit, dry, and free of loose objects). Refer for a colonoscopy.  you will receive a phone call, about a day and time for an appointment. Please return in 1 year. (update: i left message on phone-tree:  Increase k-dur to 40/d.  Take fe 2/d.  You should take lipitor.  Call if you agree)

## 2011-08-20 LAB — PTH, INTACT AND CALCIUM
Calcium, Total (PTH): 9.6 mg/dL (ref 8.4–10.5)
PTH: 110.9 pg/mL — ABNORMAL HIGH (ref 14.0–72.0)

## 2011-10-28 ENCOUNTER — Encounter: Payer: Self-pay | Admitting: Gastroenterology

## 2011-12-08 ENCOUNTER — Encounter: Payer: Self-pay | Admitting: Endocrinology

## 2011-12-08 ENCOUNTER — Ambulatory Visit (INDEPENDENT_AMBULATORY_CARE_PROVIDER_SITE_OTHER)
Admission: RE | Admit: 2011-12-08 | Discharge: 2011-12-08 | Disposition: A | Discharge status: Home / Self Care | Payer: BC Managed Care – PPO | Source: Ambulatory Visit | Attending: Endocrinology | Admitting: Endocrinology

## 2011-12-08 ENCOUNTER — Ambulatory Visit (INDEPENDENT_AMBULATORY_CARE_PROVIDER_SITE_OTHER): Payer: BC Managed Care – PPO | Admitting: Endocrinology

## 2011-12-08 ENCOUNTER — Other Ambulatory Visit (INDEPENDENT_AMBULATORY_CARE_PROVIDER_SITE_OTHER): Payer: BC Managed Care – PPO

## 2011-12-08 DIAGNOSIS — E876 Hypokalemia: Secondary | ICD-10-CM | POA: Insufficient documentation

## 2011-12-08 DIAGNOSIS — D509 Iron deficiency anemia, unspecified: Secondary | ICD-10-CM

## 2011-12-08 DIAGNOSIS — R059 Cough, unspecified: Secondary | ICD-10-CM

## 2011-12-08 DIAGNOSIS — R05 Cough: Secondary | ICD-10-CM

## 2011-12-08 LAB — BASIC METABOLIC PANEL
BUN: 18 mg/dL (ref 6–23)
Chloride: 104 mEq/L (ref 96–112)
Glucose, Bld: 99 mg/dL (ref 70–99)
Potassium: 3.4 mEq/L — ABNORMAL LOW (ref 3.5–5.1)

## 2011-12-08 LAB — CBC WITH DIFFERENTIAL/PLATELET
Basophils Absolute: 0 10*3/uL (ref 0.0–0.1)
HCT: 37.4 % (ref 36.0–46.0)
Lymphs Abs: 1.5 10*3/uL (ref 0.7–4.0)
MCV: 84.6 fl (ref 78.0–100.0)
Monocytes Absolute: 0.4 10*3/uL (ref 0.1–1.0)
Platelets: 379 10*3/uL (ref 150.0–400.0)
RDW: 14.4 % (ref 11.5–14.6)

## 2011-12-08 MED ORDER — AZITHROMYCIN 500 MG PO TABS: 500.0000 mg | ORAL_TABLET | Freq: Every day | ORAL | Status: AC

## 2011-12-08 MED ORDER — PROMETHAZINE-CODEINE 6.25-10 MG/5ML PO SYRP: 5.0000 mL | ORAL_SOLUTION | ORAL | Status: AC | PRN

## 2011-12-08 MED ORDER — METHYLPREDNISOLONE (PAK) 4 MG PO TABS: ORAL_TABLET | ORAL | Status: AC

## 2011-12-08 MED ORDER — FLUTICASONE-SALMETEROL 100-50 MCG/DOSE IN AEPB: 1.0000 | INHALATION_SPRAY | Freq: Two times a day (BID) | RESPIRATORY_TRACT | Status: DC

## 2011-12-08 NOTE — Patient Instructions (Addendum)
A chest-x-ray, and blood tests, are being requested for you today.  please call [phone] to hear your test results.  You will be prompted to enter the 9-digit "MRN" number that appears at the top left of this page, followed by #.  Then you will hear the message. Here are 4 prescriptions:  Antibiotic, inhaler, cough syrup, and a steroid "pack." I hope you feel better soon.  If you don't feel better by next week, please call your doctor. (update: i left message on phone-tree:  rx as we discussed).

## 2011-12-08 NOTE — Progress Notes (Signed)
  Subjective:    Patient ID: Jocelyn Ford, female    DOB: Apr 21, 1951, 60 y.o.   MRN: [id number]  HPI Pt states few days of slight wheezing in the chest, and assoc prod-quality cough.   Past Medical History  Diagnosis Date  . Dizziness and giddiness   . Dysuria   . Acute gouty arthropathy   . Iron deficiency anemia, unspecified   . Pure hypercholesterolemia   . Routine general medical examination at a health care facility   . Electrocardiogram finding, abnormal, without diagnosis   . Osteoporosis, unspecified   . Unspecified essential hypertension   . Esophageal reflux   . Allergic rhinitis, cause unspecified     Past Surgical History  Procedure Date  . Abdominal hysterectomy     History   Social History  . Marital Status: Divorced    Spouse Name: N/A    Number of Children: N/A  . Years of Education: N/A   Occupational History  . Not on file.   Social History Main Topics  . Smoking status: Never Smoker   . Smokeless tobacco: Not on file  . Alcohol Use: Not on file  . Drug Use: Not on file  . Sexually Active: Not on file   Other Topics Concern  . Not on file   Social History Narrative  . No narrative on file    Current Outpatient Prescriptions on File Prior to Visit  Medication Sig Dispense Refill  . allopurinol (ZYLOPRIM) 100 MG tablet TAKE 1 TABLET (100 MG TOTAL) BY MOUTH DAILY.  30 tablet  5  . atenolol (TENORMIN) 25 MG tablet Take 1 tablet (25 mg total) by mouth 2 (two) times daily.  60 tablet  11  . fluticasone (FLONASE) 50 MCG/ACT nasal spray Place 2 sprays into the nose daily.  16 g  12  . furosemide (LASIX) 20 MG tablet Take 20 mg by mouth daily.        Marland Kitchen omeprazole (PRILOSEC) 40 MG capsule Take 1 capsule (40 mg total) by mouth daily.  30 capsule  11  . potassium chloride SA (K-DUR,KLOR-CON) 20 MEQ tablet 2 tabs daily  60 tablet  11  . losartan (COZAAR) 50 MG tablet Take 1 tablet (50 mg total) by mouth daily.  30 tablet  5    Allergies  Allergen  Reactions  . Nifedipine     REACTION: Nausea,weakness    Family History  Problem Relation Age of Onset  . Cancer Father     Prostate    BP 144/82  Pulse 80  Temp(Src) 99.1 F (37.3 C) (Oral)  Ht 5\' 7"  (1.702 m)  Wt 209 lb (94.802 kg)  BMI 32.73 kg/m2  SpO2 97%   Review of Systems She also has low-grade fever, but no nasal congestion.    Objective:   Physical Exam VITAL SIGNS:  See vs page GENERAL: no distress head: no deformity eyes: no periorbital swelling, no proptosis external nose and ears are normal mouth: no lesion seen Both tm's are red LUNGS:  Clear to auscultation   CXR: NAD    Assessment & Plan:  Acute bronchitis, new

## 2011-12-09 LAB — IBC PANEL: Iron: 47 ug/dL (ref 42–145)

## 2011-12-22 ENCOUNTER — Other Ambulatory Visit: Payer: Self-pay | Admitting: Endocrinology

## 2012-01-14 ENCOUNTER — Other Ambulatory Visit: Payer: Self-pay | Admitting: Endocrinology

## 2012-03-29 ENCOUNTER — Telehealth: Payer: Self-pay

## 2012-03-29 NOTE — Telephone Encounter (Signed)
There is no medication that would help this.  Drink plenty of fluids

## 2012-03-29 NOTE — Telephone Encounter (Signed)
Pt called c/o of diarrhea and abd cramping. Pt denies fever, N&V. Pt is requesting Rx to treat and says that she is unable to come in for OV due to sxs.

## 2012-03-29 NOTE — Telephone Encounter (Signed)
Pt informed of MD's advisement. 

## 2012-04-08 ENCOUNTER — Other Ambulatory Visit: Payer: Self-pay | Admitting: Endocrinology

## 2012-05-06 ENCOUNTER — Other Ambulatory Visit: Payer: Self-pay | Admitting: Endocrinology

## 2012-06-13 ENCOUNTER — Ambulatory Visit (INDEPENDENT_AMBULATORY_CARE_PROVIDER_SITE_OTHER): Payer: BC Managed Care – PPO | Admitting: Family Medicine

## 2012-06-13 VITALS — BP 171/78 | HR 74 | Temp 97.9°F | Resp 16 | Ht 67.5 in | Wt 210.0 lb

## 2012-06-13 DIAGNOSIS — J309 Allergic rhinitis, unspecified: Secondary | ICD-10-CM

## 2012-06-13 DIAGNOSIS — J329 Chronic sinusitis, unspecified: Secondary | ICD-10-CM

## 2012-06-13 MED ORDER — CETIRIZINE HCL 10 MG PO TABS: 10.0000 mg | ORAL_TABLET | Freq: Every day | ORAL | Status: DC

## 2012-06-13 MED ORDER — AMOXICILLIN-POT CLAVULANATE 875-125 MG PO TABS: 1.0000 | ORAL_TABLET | Freq: Two times a day (BID) | ORAL | Status: AC

## 2012-06-13 MED ORDER — METHYLPREDNISOLONE ACETATE 40 MG/ML IJ SUSP
40.0000 mg | Freq: Once | INTRAMUSCULAR | Status: AC
Start: 2012-06-13 — End: 2012-06-13
  Administered 2012-06-13: 40 mg via INTRAMUSCULAR

## 2012-06-13 NOTE — Patient Instructions (Signed)

## 2012-06-13 NOTE — Progress Notes (Signed)
  Subjective:    Patient ID: Jocelyn Ford, female    DOB: Jun 29, 1951, 61 y.o.   MRN: [id number]  HPI URI Symptoms Onset: 2-3 weeks Description: sinus pressure, nasal congestion, sinus headache, post nasal drip, cough Modifying factors:  Baseline hx/o allergic rhinitis   intermittent NSAID and decongestant use  Symptoms Nasal discharge: yes Fever: no Sore throat: mild Cough: yes Wheezing: no Ear pain: no GI symptoms: no Sick contacts: no  Red Flags  Stiff neck: no Dyspnea: no Chest pain: no  Rash: no Swallowing difficulty: no  Sinusitis Risk Factors Headache/face pain: yes Double sickening: no tooth pain: no  Allergy Risk Factors Sneezing: mild Itchy scratchy throat: yes Seasonal symptoms: yes  Flu Risk Factors Headache: yes muscle aches: no severe fatigue: no     Review of Systems See HPI, otherwise ROS negative     Objective:   Physical Exam Gen: up in chair, NAD HEENT: NCAT, EOMI, TMs clear bilaterally, +nasal erythema, rhinorrhea bilaterally, + post oropharyngeal erythema, + frontal/maxillary TTP CV: RRR, no murmurs auscultated PULM: CTAB, no wheezes, rales, rhoncii ABD: S/NT/+ bowel sounds  EXT: 2+ peripheral pulses    Assessment & Plan:  Sinusitis:  Amoxicillin Depo-medrol x 1  Zyrtec Follow up with PCP about BPs  Would consider addition of singulair to regimen if sxs persist after treatment.

## 2012-06-18 ENCOUNTER — Other Ambulatory Visit: Payer: Self-pay | Admitting: Endocrinology

## 2012-08-18 ENCOUNTER — Other Ambulatory Visit (INDEPENDENT_AMBULATORY_CARE_PROVIDER_SITE_OTHER): Payer: BC Managed Care – PPO

## 2012-08-18 ENCOUNTER — Encounter: Payer: Self-pay | Admitting: Endocrinology

## 2012-08-18 ENCOUNTER — Ambulatory Visit (INDEPENDENT_AMBULATORY_CARE_PROVIDER_SITE_OTHER)
Admission: RE | Admit: 2012-08-18 | Discharge: 2012-08-18 | Disposition: A | Discharge status: Home / Self Care | Payer: BC Managed Care – PPO | Source: Ambulatory Visit | Attending: Endocrinology | Admitting: Endocrinology

## 2012-08-18 ENCOUNTER — Ambulatory Visit (INDEPENDENT_AMBULATORY_CARE_PROVIDER_SITE_OTHER): Payer: BC Managed Care – PPO | Admitting: Endocrinology

## 2012-08-18 VITALS — BP 118/82 | HR 82 | Temp 97.9°F | Ht 67.0 in | Wt 207.0 lb

## 2012-08-18 DIAGNOSIS — D509 Iron deficiency anemia, unspecified: Secondary | ICD-10-CM

## 2012-08-18 DIAGNOSIS — Z79899 Other long term (current) drug therapy: Secondary | ICD-10-CM

## 2012-08-18 DIAGNOSIS — M109 Gout, unspecified: Secondary | ICD-10-CM

## 2012-08-18 DIAGNOSIS — N289 Disorder of kidney and ureter, unspecified: Secondary | ICD-10-CM

## 2012-08-18 DIAGNOSIS — M25569 Pain in unspecified knee: Secondary | ICD-10-CM

## 2012-08-18 DIAGNOSIS — E876 Hypokalemia: Secondary | ICD-10-CM

## 2012-08-18 DIAGNOSIS — M25562 Pain in left knee: Secondary | ICD-10-CM

## 2012-08-18 DIAGNOSIS — M791 Myalgia, unspecified site: Secondary | ICD-10-CM

## 2012-08-18 DIAGNOSIS — I10 Essential (primary) hypertension: Secondary | ICD-10-CM

## 2012-08-18 DIAGNOSIS — E7800 Pure hypercholesterolemia, unspecified: Secondary | ICD-10-CM

## 2012-08-18 DIAGNOSIS — M2550 Pain in unspecified joint: Secondary | ICD-10-CM

## 2012-08-18 DIAGNOSIS — IMO0001 Reserved for inherently not codable concepts without codable children: Secondary | ICD-10-CM

## 2012-08-18 LAB — BASIC METABOLIC PANEL
BUN: 18 mg/dL (ref 6–23)
CO2: 31 mEq/L (ref 19–32)
GFR: 56.31 mL/min — ABNORMAL LOW (ref >60.00)
Glucose, Bld: 79 mg/dL (ref 70–99)
Potassium: 3.8 mEq/L (ref 3.5–5.1)

## 2012-08-18 LAB — URINALYSIS, ROUTINE W REFLEX MICROSCOPIC
Bilirubin Urine: NEGATIVE
Leukocytes, UA: NEGATIVE
Nitrite: NEGATIVE
Specific Gravity, Urine: <=1.005 (ref 1.000–1.030)
Total Protein, Urine: NEGATIVE
pH: 6 (ref 5.0–8.0)

## 2012-08-18 LAB — CBC WITH DIFFERENTIAL/PLATELET
Basophils Absolute: 0 10*3/uL (ref 0.0–0.1)
Basophils Relative: 0.8 % (ref 0.0–3.0)
HCT: 34.4 % — ABNORMAL LOW (ref 36.0–46.0)
Hemoglobin: 11.1 g/dL — ABNORMAL LOW (ref 12.0–15.0)
Lymphs Abs: 2 10*3/uL (ref 0.7–4.0)
MCHC: 32.3 g/dL (ref 30.0–36.0)
Monocytes Relative: 9 % (ref 3.0–12.0)
Neutro Abs: 1.5 10*3/uL (ref 1.4–7.7)
RBC: 4.01 Mil/uL (ref 3.87–5.11)
RDW: 13.6 % (ref 11.5–14.6)

## 2012-08-18 LAB — LIPID PANEL
Cholesterol: 208 mg/dL — ABNORMAL HIGH (ref 0–200)
HDL: 50.1 mg/dL (ref >39.00)
Triglycerides: 93 mg/dL (ref 0.0–149.0)

## 2012-08-18 LAB — HEPATIC FUNCTION PANEL
Albumin: 3.7 g/dL (ref 3.5–5.2)
Alkaline Phosphatase: 67 U/L (ref 39–117)
Bilirubin, Direct: 0.1 mg/dL (ref 0.0–0.3)

## 2012-08-18 LAB — LDL CHOLESTEROL, DIRECT: Direct LDL: 151.8 mg/dL

## 2012-08-18 MED ORDER — DICLOFENAC SODIUM 1 % TD GEL: 2.0000 g | Freq: Four times a day (QID) | TRANSDERMAL | Status: DC

## 2012-08-18 NOTE — Progress Notes (Signed)
Subjective:    Patient ID: Jocelyn Ford, female    DOB: [birth year], 61 y.o.   MRN: [id number]  HPI Pt states few months of intermittent moderate pain at the left knee and leg, but no assoc numbness.  Past Medical History  Diagnosis Date  . Dizziness and giddiness   . Dysuria   . Acute gouty arthropathy   . Iron deficiency anemia, unspecified   . Pure hypercholesterolemia   . Routine general medical examination at a health care facility   . Nonspecific abnormal electrocardiogram (ECG) (EKG)   . Osteoporosis, unspecified   . Unspecified essential hypertension   . Esophageal reflux   . Allergic rhinitis, cause unspecified     Past Surgical History  Procedure Date  . Abdominal hysterectomy     History   Social History  . Marital Status: Divorced    Spouse Name: N/A    Number of Children: N/A  . Years of Education: N/A   Occupational History  . Not on file.   Social History Main Topics  . Smoking status: Never Smoker   . Smokeless tobacco: Not on file  . Alcohol Use: Not on file  . Drug Use: Not on file  . Sexually Active: Not on file   Other Topics Concern  . Not on file   Social History Narrative  . No narrative on file    Current Outpatient Prescriptions on File Prior to Visit  Medication Sig Dispense Refill  . allopurinol (ZYLOPRIM) 100 MG tablet TAKE 1 TABLET (100 MG TOTAL) BY MOUTH DAILY.  30 tablet  5  . atenolol (TENORMIN) 25 MG tablet TAKE 1 TABLET (25 MG TOTAL) BY MOUTH 2 (TWO) TIMES DAILY.  60 tablet  5  . cetirizine (ZYRTEC) 10 MG tablet Take 1 tablet (10 mg total) by mouth daily.  30 tablet  11  . furosemide (LASIX) 20 MG tablet TAKE 1 TABLET (20 MG TOTAL) BY MOUTH DAILY.  30 tablet  5  . omeprazole (PRILOSEC) 40 MG capsule Take 1 capsule (40 mg total) by mouth daily.  30 capsule  11  . potassium chloride SA (KLOR-CON M20) 20 MEQ tablet Take 2 tablets (40 mEq total) by mouth daily.  60 tablet  3  . Fluticasone-Salmeterol (ADVAIR DISKUS) 100-50  MCG/DOSE AEPB Inhale 1 puff into the lungs 2 (two) times daily.  1 each  1    Allergies  Allergen Reactions  . Nifedipine     REACTION: Nausea,weakness    Family History  Problem Relation Age of Onset  . Cancer Father     Prostate    BP 118/82  Pulse 82  Temp 97.9 F (36.6 C) (Oral)  Ht 5\' 7"  (1.702 m)  Wt 207 lb (93.895 kg)  BMI 32.42 kg/m2  SpO2 97%   Review of Systems She also has intermittent leg cramps.  No rash.      Objective:   Physical Exam VITAL SIGNS:  See vs page GENERAL: no distress LLE: Neuro: sensation is intact to touch Left knee: no swelling/tend/warmth.  Full rom without pain.   Gait: slightly favors LLE.     Lab Results  Component Value Date   WBC 4.1* 08/18/2012   HGB 11.1* 08/18/2012   HCT 34.4* 08/18/2012   PLT 320.0 08/18/2012   GLUCOSE 79 08/18/2012   CHOL 208* 08/18/2012   TRIG 93.0 08/18/2012   HDL 50.10 08/18/2012   LDLDIRECT 151.8 08/18/2012   LDLCALC 120* 01/13/2010   ALT 17 08/18/2012  AST 19 08/18/2012   NA 141 08/18/2012   K 3.8 08/18/2012   CL 103 08/18/2012   CREATININE 1.3* 08/18/2012   BUN 18 08/18/2012   CO2 31 08/18/2012   TSH 0.78 08/18/2012      Assessment & Plan:  Knee and leg pain, new, uncertain etiology Renal insuff.  She should not take nsaid Dyslipidemia, needs increased rx

## 2012-08-18 NOTE — Patient Instructions (Addendum)
blood tests and x-rays are being requested for you today.  You will receive a letter with results. Please come in soon for a regular physical.  i have sent a prescription to your pharmacy, for a skin gel, to help the pain. I hope you feel better soon.  If you don't feel better by next week, please call back, and i would be happy to set you up with an orthopedic doctor.

## 2012-08-21 ENCOUNTER — Encounter: Payer: Self-pay | Admitting: Endocrinology

## 2012-08-26 ENCOUNTER — Other Ambulatory Visit: Payer: Self-pay | Admitting: Endocrinology

## 2012-09-13 DIAGNOSIS — Z0279 Encounter for issue of other medical certificate: Secondary | ICD-10-CM

## 2012-09-23 ENCOUNTER — Other Ambulatory Visit: Payer: Self-pay | Admitting: Endocrinology

## 2012-09-25 NOTE — Telephone Encounter (Signed)
Refill req for K-lor (05/06/12) and Atenolol (04/08/12). Pt last seen on 08/18/12.

## 2012-10-18 ENCOUNTER — Telehealth: Payer: Self-pay | Admitting: Endocrinology

## 2012-10-18 NOTE — Telephone Encounter (Signed)
Please advise ov 

## 2012-10-18 NOTE — Telephone Encounter (Signed)
Caller: [name]/Patient; Patient Name: Jocelyn Ford; PCP: Romero Belling (Adults only); Best Callback Phone Number: [phone].  Pt calling today 10/18/12 regarding having gout flare in toe of right foot.  Onset this AM.  Has not had a gout flare up for a while and does not have any medication.  Wants to know if MD can call in medicaiton for gout.  Emergent symptoms r/o by Foot Non Injury guidelines with exception of sudden onset of warmth, swelling, pain and extreme tenderness of affected joint.  (See Provider Within 24 Hours).  Care advice given. Pt delcined appt.  Requesting gout medication be called in to her pharmacy [facility] [phone].  OFFICE PLEASE CALL PT BACK AT [phone] TO LET HER KNOW IF MEDICATION WILL BE CALLED IN OR IF MD WILL REQUIRE APPT.

## 2012-10-18 NOTE — Telephone Encounter (Signed)
Pt stated she will call back to make an appt when she can come in

## 2012-12-11 ENCOUNTER — Ambulatory Visit (INDEPENDENT_AMBULATORY_CARE_PROVIDER_SITE_OTHER): Payer: BC Managed Care – PPO | Admitting: Emergency Medicine

## 2012-12-11 VITALS — BP 148/90 | HR 65 | Temp 98.0°F | Resp 18 | Ht 69.0 in | Wt 214.0 lb

## 2012-12-11 DIAGNOSIS — H6990 Unspecified Eustachian tube disorder, unspecified ear: Secondary | ICD-10-CM

## 2012-12-11 DIAGNOSIS — J029 Acute pharyngitis, unspecified: Secondary | ICD-10-CM

## 2012-12-11 DIAGNOSIS — H6980 Other specified disorders of Eustachian tube, unspecified ear: Secondary | ICD-10-CM

## 2012-12-11 MED ORDER — PSEUDOEPHEDRINE-GUAIFENESIN ER 60-600 MG PO TB12: 1.0000 | ORAL_TABLET | Freq: Two times a day (BID) | ORAL | Status: DC

## 2012-12-11 MED ORDER — AZITHROMYCIN 250 MG PO TABS: ORAL_TABLET | ORAL | Status: DC

## 2012-12-11 NOTE — Progress Notes (Signed)
[facility] [address], [place] [address] [phone]  Date:  12/11/2012   Name:  Jocelyn Ford   DOB:  06-Mar-1951   MRN:  [id number]  PCP:  Romero Belling, MD    Chief Complaint: Sore Throat, Nasal Congestion and Dizziness   History of Present Illness:  Jocelyn Ford is a 61 y.o. very pleasant female patient who presents with the following:  Ill since yesterday with nasal congestion and a sore throat. No fever but feels chilled. No cough or coryza.  No wheezing or shortness of breath. No nausea or vomiting.  No stool change.  No improvement with OTC medication.  Pain in left ear.  No drainage.  Exposed to strep at home  Patient Active Problem List  Diagnosis  . HYPERCHOLESTEROLEMIA  . ACUTE GOUTY ARTHROPATHY  . ANEMIA, IRON DEFICIENCY  . HYPERTENSION  . ALLERGIC RHINITIS  . GERD  . OSTEOPOROSIS  . DIZZINESS  . DYSURIA  . ELECTROCARDIOGRAM, ABNORMAL  . Nonspecific abnormal electrocardiogram (ECG) (EKG)  . Arthralgia  . Encounter for long-term (current) use of other medications  . Routine general medical examination at a health care facility  . Hypopotassemia  . Cough  . Knee pain, left  . Myalgia  . Renal insufficiency    Past Medical History  Diagnosis Date  . Dizziness and giddiness   . Dysuria   . Acute gouty arthropathy   . Iron deficiency anemia, unspecified   . Pure hypercholesterolemia   . Routine general medical examination at a health care facility   . Nonspecific abnormal electrocardiogram (ECG) (EKG)   . Osteoporosis, unspecified   . Unspecified essential hypertension   . Esophageal reflux   . Allergic rhinitis, cause unspecified   . Arthritis     Past Surgical History  Procedure Date  . Abdominal hysterectomy     History  Substance Use Topics  . Smoking status: Never Smoker   . Smokeless tobacco: Not on file  . Alcohol Use: No    Family History  Problem Relation Age of Onset  . Cancer Father    Prostate    Allergies  Allergen Reactions  . Nifedipine     REACTION: Nausea,weakness    Medication list has been reviewed and updated.  Current Outpatient Prescriptions on File Prior to Visit  Medication Sig Dispense Refill  . atenolol (TENORMIN) 25 MG tablet TAKE 1 TABLET (25 MG TOTAL) BY MOUTH 2 (TWO) TIMES DAILY.  60 tablet  5  . furosemide (LASIX) 20 MG tablet TAKE 1 TABLET (20 MG TOTAL) BY MOUTH DAILY.  30 tablet  5  . KLOR-CON M20 20 MEQ tablet TAKE 2 TABLETS (40 MEQ TOTAL) BY MOUTH DAILY.  60 tablet  3  . allopurinol (ZYLOPRIM) 100 MG tablet TAKE 1 TABLET (100 MG TOTAL) BY MOUTH DAILY.  30 tablet  5  . cetirizine (ZYRTEC) 10 MG tablet Take 1 tablet (10 mg total) by mouth daily.  30 tablet  11  . diclofenac sodium (VOLTAREN) 1 % GEL Apply 2 g topically 4 (four) times daily.  100 g  2  . Fluticasone-Salmeterol (ADVAIR DISKUS) 100-50 MCG/DOSE AEPB Inhale 1 puff into the lungs 2 (two) times daily.  1 each  1  . omeprazole (PRILOSEC) 40 MG capsule TAKE 1 CAPSULE (40 MG TOTAL) BY MOUTH DAILY.  30 capsule  11    Review of Systems:  As per HPI, otherwise negative.    Physical Examination: Filed Vitals:  12/11/12 1028  BP: 148/90  Pulse: 65  Temp: 98 F (36.7 C)  Resp: 18   Filed Vitals:   12/11/12 1028  Height: 5\' 9"  (1.753 m)  Weight: 214 lb (97.07 kg)   Body mass index is 31.60 kg/(m^2). Ideal Body Weight: Weight in (lb) to have BMI = 25: 168.9   GEN: WDWN, NAD, Non-toxic, A & O x 3  No rash or sepsis HEENT: Atraumatic, Normocephalic. Neck supple. No masses, No LAD.  Oropharynx erythematous and injected  TM negative.  Left valsalva negative Ears and Nose: No external deformity. CV: RRR, No M/G/R. No JVD. No thrill. No extra heart sounds. PULM: CTA B, no wheezes, crackles, rhonchi. No retractions. No resp. distress. No accessory muscle use. ABD: S, NT, ND, +BS. No rebound. No HSM. EXTR: No c/c/e NEURO Normal gait.  PSYCH: Normally interactive. Conversant. Not  depressed or anxious appearing.  Calm demeanor.    Assessment and Plan: Eustachian tub dysfunction Strep exposure zpak mucinex d Follow up as needed  Carmelina Dane, MD

## 2012-12-19 ENCOUNTER — Other Ambulatory Visit: Payer: Self-pay

## 2012-12-19 MED ORDER — FUROSEMIDE 20 MG PO TABS: 20.0000 mg | ORAL_TABLET | Freq: Every day | ORAL | Status: DC

## 2013-01-01 ENCOUNTER — Telehealth: Payer: Self-pay | Admitting: Endocrinology

## 2013-01-01 NOTE — Telephone Encounter (Signed)
Pt would like you to change to a different rx the ompreazole dr upsets her stomach

## 2013-01-01 NOTE — Telephone Encounter (Signed)
Pt states she her insurance changed her rx, but it is upsetting her stomach could you change to a different rx?

## 2013-01-01 NOTE — Telephone Encounter (Signed)
The patient called to stated that her pharmacy gave her Omeprazole DR 40mg  instead of Omeprazole 40 mg and it is upsetting her stomach.  The patient would like to discuss this.  She may be reached at [phone].

## 2013-01-01 NOTE — Telephone Encounter (Signed)
Either is fine with me.  i would be happy to send rx

## 2013-01-02 MED ORDER — LANSOPRAZOLE 30 MG PO TBDP: 30.0000 mg | ORAL_TABLET | Freq: Every day | ORAL | Status: DC

## 2013-01-02 NOTE — Telephone Encounter (Signed)
i sent rx 

## 2013-01-06 ENCOUNTER — Encounter (HOSPITAL_COMMUNITY): Payer: Self-pay | Admitting: Emergency Medicine

## 2013-01-06 ENCOUNTER — Emergency Department (HOSPITAL_COMMUNITY)
Admission: EM | Admit: 2013-01-06 | Discharge: 2013-01-06 | Disposition: A | Discharge status: Home / Self Care | Payer: BC Managed Care – PPO | Attending: Emergency Medicine | Admitting: Emergency Medicine

## 2013-01-06 ENCOUNTER — Emergency Department (HOSPITAL_COMMUNITY): Payer: BC Managed Care – PPO

## 2013-01-06 DIAGNOSIS — Z862 Personal history of diseases of the blood and blood-forming organs and certain disorders involving the immune mechanism: Secondary | ICD-10-CM | POA: Insufficient documentation

## 2013-01-06 DIAGNOSIS — Z87448 Personal history of other diseases of urinary system: Secondary | ICD-10-CM | POA: Insufficient documentation

## 2013-01-06 DIAGNOSIS — R0789 Other chest pain: Secondary | ICD-10-CM | POA: Insufficient documentation

## 2013-01-06 DIAGNOSIS — Z8719 Personal history of other diseases of the digestive system: Secondary | ICD-10-CM | POA: Insufficient documentation

## 2013-01-06 DIAGNOSIS — Z8639 Personal history of other endocrine, nutritional and metabolic disease: Secondary | ICD-10-CM | POA: Insufficient documentation

## 2013-01-06 DIAGNOSIS — M129 Arthropathy, unspecified: Secondary | ICD-10-CM | POA: Insufficient documentation

## 2013-01-06 DIAGNOSIS — M810 Age-related osteoporosis without current pathological fracture: Secondary | ICD-10-CM | POA: Insufficient documentation

## 2013-01-06 DIAGNOSIS — Z7982 Long term (current) use of aspirin: Secondary | ICD-10-CM | POA: Insufficient documentation

## 2013-01-06 DIAGNOSIS — Z79899 Other long term (current) drug therapy: Secondary | ICD-10-CM | POA: Insufficient documentation

## 2013-01-06 DIAGNOSIS — I10 Essential (primary) hypertension: Secondary | ICD-10-CM | POA: Insufficient documentation

## 2013-01-06 LAB — CBC WITH DIFFERENTIAL/PLATELET
Basophils Relative: 1 % (ref 0–1)
HCT: 35.9 % — ABNORMAL LOW (ref 36.0–46.0)
Hemoglobin: 11.8 g/dL — ABNORMAL LOW (ref 12.0–15.0)
Lymphocytes Relative: 60 % — ABNORMAL HIGH (ref 12–46)
Lymphs Abs: 2.4 10*3/uL (ref 0.7–4.0)
MCHC: 32.9 g/dL (ref 30.0–36.0)
Monocytes Absolute: 0.3 10*3/uL (ref 0.1–1.0)
Monocytes Relative: 8 % (ref 3–12)
Neutro Abs: 0.9 10*3/uL — ABNORMAL LOW (ref 1.7–7.7)
RBC: 4.29 MIL/uL (ref 3.87–5.11)

## 2013-01-06 LAB — BASIC METABOLIC PANEL
BUN: 13 mg/dL (ref 6–23)
CO2: 26 mEq/L (ref 19–32)
Calcium: 9.5 mg/dL (ref 8.4–10.5)
Creatinine, Ser: 1.33 mg/dL — ABNORMAL HIGH (ref 0.50–1.10)

## 2013-01-06 LAB — POCT I-STAT TROPONIN I: Troponin i, poc: 0.01 ng/mL (ref 0.00–0.08)

## 2013-01-06 MED ORDER — POTASSIUM CHLORIDE CRYS ER 20 MEQ PO TBCR
20.0000 meq | EXTENDED_RELEASE_TABLET | Freq: Once | ORAL | Status: AC
Start: 2013-01-06 — End: 2013-01-06
  Administered 2013-01-06: 20 meq via ORAL
  Filled 2013-01-06: qty 1

## 2013-01-06 NOTE — ED Notes (Signed)
Pt c/o right sided CP with SOB and nausea x 2 days

## 2013-01-06 NOTE — ED Notes (Signed)
MD Davidson at bedside.

## 2013-01-06 NOTE — ED Notes (Signed)
PT reports chest tightness x 2 wks worsening today. Denies pain at this time. Denies SOB, N/V.

## 2013-01-06 NOTE — ED Provider Notes (Signed)
History     CSN: [id number]  Arrival date & time 01/06/13  1505   First MD Initiated Contact with Patient 01/06/13 1704      Chief Complaint  Patient presents with  . Chest Pain    (Consider location/radiation/quality/duration/timing/severity/associated sxs/prior treatment) Patient is a 62 y.o. female presenting with chest pain. The history is provided by the patient.  Chest Pain The chest pain began 12 - 24 hours ago. Duration of episode(s) is 2 days. Chest pain occurs intermittently. The chest pain is improving. Associated with: both exertion and at rest. At its most intense, the pain is at 8/10. The pain is currently at 0/10. The severity of the pain is moderate. The quality of the pain is described as sharp. The pain does not radiate. Chest pain is worsened by deep breathing. Pertinent negatives for primary symptoms include no fever, no shortness of breath, no palpitations, no nausea, no vomiting and no dizziness.  Pertinent negatives for associated symptoms include no diaphoresis, no numbness and no weakness. She tried nothing for the symptoms. Risk factors: hypertension.     Past Medical History  Diagnosis Date  . Dizziness and giddiness   . Dysuria   . Acute gouty arthropathy   . Iron deficiency anemia, unspecified   . Pure hypercholesterolemia   . Routine general medical examination at a health care facility   . Nonspecific abnormal electrocardiogram (ECG) (EKG)   . Osteoporosis, unspecified   . Unspecified essential hypertension   . Esophageal reflux   . Allergic rhinitis, cause unspecified   . Arthritis     Past Surgical History  Procedure Date  . Abdominal hysterectomy     Family History  Problem Relation Age of Onset  . Cancer Father     Prostate    History  Substance Use Topics  . Smoking status: Never Smoker   . Smokeless tobacco: Not on file  . Alcohol Use: No    OB History    Grav Para Term Preterm Abortions TAB SAB Ect Mult Living        Review of Systems  Constitutional: Negative for fever and diaphoresis.  HENT: Negative for neck pain and neck stiffness.   Eyes: Negative for visual disturbance.  Respiratory: Negative for apnea, chest tightness and shortness of breath.   Cardiovascular: Positive for chest pain. Negative for palpitations.  Gastrointestinal: Negative for nausea, vomiting, diarrhea and constipation.  Genitourinary: Negative for dysuria.  Musculoskeletal: Negative for gait problem.  Skin: Negative for rash.  Neurological: Negative for dizziness, weakness, light-headedness, numbness and headaches.  All other systems reviewed and are negative.    Allergies  Nifedipine  Home Medications   Current Outpatient Rx  Name  Route  Sig  Dispense  Refill  . ASPIRIN-ACETAMINOPHEN-CAFFEINE 250-250-65 MG PO TABS   Oral   Take 1 tablet by mouth every 6 (six) hours as needed. For pain         . ATENOLOL 25 MG PO TABS   Oral   Take 25 mg by mouth 2 (two) times daily.         . FUROSEMIDE 20 MG PO TABS   Oral   Take 1 tablet (20 mg total) by mouth daily.   30 tablet   5   . POTASSIUM CHLORIDE CRYS ER 20 MEQ PO TBCR   Oral   Take 20 mEq by mouth daily.           BP 207/90  Pulse 85  Temp 98.9 F (37.2  C) (Oral)  Resp 20  SpO2 97%  Physical Exam  Nursing note and vitals reviewed. Constitutional: She is oriented to person, place, and time. She appears well-developed and well-nourished. No distress.  HENT:  Head: Normocephalic and atraumatic.  Eyes: EOM are normal. Pupils are equal, round, and reactive to light.  Neck: Normal range of motion. Neck supple.       No meningeal signs  Cardiovascular: Normal rate, regular rhythm and normal heart sounds.  Exam reveals no gallop and no friction rub.   No murmur heard. Pulmonary/Chest: Effort normal and breath sounds normal. No respiratory distress. She has no wheezes. She has no rales. She exhibits no tenderness.  Abdominal: Soft. Bowel  sounds are normal. She exhibits no distension. There is no tenderness. There is no rebound and no guarding.  Musculoskeletal: Normal range of motion. She exhibits no edema and no tenderness.  Neurological: She is alert and oriented to person, place, and time. No cranial nerve deficit.  Skin: Skin is warm and dry. She is not diaphoretic. No erythema.    ED Course  Procedures (including critical care time)  Labs Reviewed  CBC WITH DIFFERENTIAL - Abnormal; Notable for the following:    Hemoglobin 11.8 (*)     HCT 35.9 (*)     Neutrophils Relative 24 (*)     Neutro Abs 0.9 (*)     Lymphocytes Relative 60 (*)     Eosinophils Relative 8 (*)     All other components within normal limits  BASIC METABOLIC PANEL - Abnormal; Notable for the following:    Potassium 3.2 (*)     Glucose, Bld 112 (*)     Creatinine, Ser 1.33 (*)     GFR calc non Af Amer 43 (*)     GFR calc Af Amer 50 (*)     All other components within normal limits  POCT I-STAT TROPONIN I   Dg Chest 2 View  01/06/2013  *RADIOLOGY REPORT*  Clinical Data: Chest pain  CHEST - 2 VIEW  Comparison: 12/08/2011  Findings: Heart size and vascularity is normal.  Mild atherosclerotic calcification in the aortic arch.  Mild apical pleural scarring bilaterally.  Negative for pneumonia.  Negative for pleural effusion or mass.  IMPRESSION: No acute abnormality.   Original Report Authenticated By: Janeece Riggers, M.D.     Date: 01/07/2013  Rate: 91  Rhythm: normal sinus rhythm  QRS Axis: normal axis  Intervals: PRI 184  ST/T Wave abnormalities: {none  Conduction Disutrbances none  Narrative Interpretation: normal EKG  Old EKG Reviewed: none avail    Non cardiac related chest pain    MDM  Troponins negative x2. EKG shows normal sinus rhythm. BP decreased form 207/90 to 159/83 while in ED. CXR shows no acute cardiopulmonary findings, with some mild atherosclerotic calcification in the aortic arch. Lab work showed some hypokalemia,  treated with KCl in ED. Ruled out acute coronary event. Discussed test results with patient and directed to follow up with primary doctor. Pt in agreement with plan.  Glade Nurse, PA-C 01/07/13 1304  Glade Nurse, PA-C 01/07/13 1308

## 2013-01-07 NOTE — ED Provider Notes (Signed)
Medical screening examination/treatment/procedure(s) were conducted as a shared visit with non-physician practitioner(s) and myself.  I personally evaluated the patient during the encounter Pleasant late middle aged lady with chest pain, negative workup.  I reviewed her lab work with her.  Reassured and released.  Carleene Cooper III, MD 01/07/13 1323

## 2013-01-08 ENCOUNTER — Other Ambulatory Visit: Payer: Self-pay

## 2013-01-08 MED ORDER — POTASSIUM CHLORIDE CRYS ER 20 MEQ PO TBCR: 20.0000 meq | EXTENDED_RELEASE_TABLET | Freq: Every day | ORAL | Status: DC

## 2013-01-09 ENCOUNTER — Other Ambulatory Visit: Payer: Self-pay | Admitting: Neurology

## 2013-01-09 MED ORDER — ATENOLOL 25 MG PO TABS: 25.0000 mg | ORAL_TABLET | Freq: Two times a day (BID) | ORAL | Status: DC

## 2013-01-12 ENCOUNTER — Ambulatory Visit (INDEPENDENT_AMBULATORY_CARE_PROVIDER_SITE_OTHER): Payer: BC Managed Care – PPO | Admitting: Endocrinology

## 2013-01-12 ENCOUNTER — Encounter: Payer: Self-pay | Admitting: Endocrinology

## 2013-01-12 VITALS — BP 182/100 | HR 90 | Temp 98.1°F | Resp 16 | Wt 209.0 lb

## 2013-01-12 DIAGNOSIS — R079 Chest pain, unspecified: Secondary | ICD-10-CM

## 2013-01-12 MED ORDER — AMLODIPINE BESYLATE 5 MG PO TABS: 5.0000 mg | ORAL_TABLET | Freq: Every day | ORAL | Status: DC

## 2013-01-12 MED ORDER — POTASSIUM CHLORIDE CRYS ER 20 MEQ PO TBCR: 20.0000 meq | EXTENDED_RELEASE_TABLET | Freq: Two times a day (BID) | ORAL | Status: DC

## 2013-01-12 NOTE — Progress Notes (Signed)
Subjective:    Patient ID: Jocelyn Ford, female    DOB: January 08, 1951, 62 y.o.   MRN: [id number]  HPI The state of at least three ongoing medical problems is addressed today, with interval history of each noted here: chest pain is resolved.  She feels as though it might be of musculoskeletal origin. HTN: she takes tenormin as rx'ed.  Denies sob. Hypokalemia:  She has a few muscle cramps.   Past Medical History  Diagnosis Date  . Dizziness and giddiness   . Dysuria   . Acute gouty arthropathy   . Iron deficiency anemia, unspecified   . Pure hypercholesterolemia   . Routine general medical examination at a health care facility   . Nonspecific abnormal electrocardiogram (ECG) (EKG)   . Osteoporosis, unspecified   . Unspecified essential hypertension   . Esophageal reflux   . Allergic rhinitis, cause unspecified   . Arthritis     Past Surgical History  Procedure Date  . Abdominal hysterectomy     History   Social History  . Marital Status: Divorced    Spouse Name: N/A    Number of Children: N/A  . Years of Education: N/A   Occupational History  . Not on file.   Social History Main Topics  . Smoking status: Never Smoker   . Smokeless tobacco: Not on file  . Alcohol Use: No  . Drug Use: No  . Sexually Active: No   Other Topics Concern  . Not on file   Social History Narrative  . No narrative on file    Current Outpatient Prescriptions on File Prior to Visit  Medication Sig Dispense Refill  . aspirin-acetaminophen-caffeine (EXCEDRIN MIGRAINE) 250-250-65 MG per tablet Take 1 tablet by mouth every 6 (six) hours as needed. For pain      . atenolol (TENORMIN) 25 MG tablet Take 1 tablet (25 mg total) by mouth 2 (two) times daily.  60 tablet  5  . furosemide (LASIX) 20 MG tablet Take 1 tablet (20 mg total) by mouth daily.  30 tablet  5  . amLODipine (NORVASC) 5 MG tablet Take 1 tablet (5 mg total) by mouth daily.  30 tablet  11    Allergies  Allergen Reactions  .  Nifedipine     REACTION: Nausea,weakness    Family History  Problem Relation Age of Onset  . Cancer Father     Prostate    BP 182/100  Pulse 90  Temp 98.1 F (36.7 C) (Oral)  Resp 16  Wt 209 lb (94.802 kg)  SpO2 98%    Review of Systems She also has a few arthralgias.  Denies edema    Objective:   Physical Exam VITAL SIGNS:  See vs page GENERAL: no distress LUNGS:  Clear to auscultation HEART:  Regular rate and rhythm without murmurs noted. Normal S1,S2.    Lab Results  Component Value Date   WBC 4.0 01/06/2013   HGB 11.8* 01/06/2013   HCT 35.9* 01/06/2013   PLT 328 01/06/2013   GLUCOSE 112* 01/06/2013   CHOL 208* 08/18/2012   TRIG 93.0 08/18/2012   HDL 50.10 08/18/2012   LDLDIRECT 151.8 08/18/2012   LDLCALC 120* 01/13/2010   ALT 17 08/18/2012   AST 19 08/18/2012   NA 140 01/06/2013   K 3.2* 01/06/2013   CL 101 01/06/2013   CREATININE 1.33* 01/06/2013   BUN 13 01/06/2013   CO2 26 01/06/2013   TSH 0.78 08/18/2012      Assessment & Plan:  Chest pain, resolved.  However, since she has been to the ER with this, she should have a treadmill at least. HTN: needs increased rx Hypokalemia: needs increased rx

## 2013-01-12 NOTE — Patient Instructions (Addendum)
i have sent a prescription to your pharmacy, for an additional blood pressure medication. Please increase the potassium to twice a day.  Let's check a treadmill (heart) test.  you will receive a phone call, about a day and time for an appointment.

## 2013-02-01 ENCOUNTER — Encounter: Payer: BC Managed Care – PPO | Admitting: Physician Assistant

## 2013-02-03 ENCOUNTER — Other Ambulatory Visit: Payer: Self-pay

## 2013-02-21 ENCOUNTER — Ambulatory Visit (INDEPENDENT_AMBULATORY_CARE_PROVIDER_SITE_OTHER): Payer: BC Managed Care – PPO | Admitting: Family Medicine

## 2013-02-21 VITALS — BP 168/88 | HR 79 | Temp 97.9°F | Resp 16 | Ht 68.25 in | Wt 208.0 lb

## 2013-02-21 DIAGNOSIS — J329 Chronic sinusitis, unspecified: Secondary | ICD-10-CM | POA: Insufficient documentation

## 2013-02-21 DIAGNOSIS — I10 Essential (primary) hypertension: Secondary | ICD-10-CM

## 2013-02-21 DIAGNOSIS — R51 Headache: Secondary | ICD-10-CM

## 2013-02-21 MED ORDER — AMOXICILLIN-POT CLAVULANATE 875-125 MG PO TABS: 1.0000 | ORAL_TABLET | Freq: Two times a day (BID) | ORAL | Status: DC

## 2013-02-21 MED ORDER — AMLODIPINE BESYLATE 5 MG PO TABS: 10.0000 mg | ORAL_TABLET | Freq: Every day | ORAL | Status: DC

## 2013-02-21 NOTE — Progress Notes (Signed)
Chief complaint: Headache sore throat history of sinus infections  History of present illness: Patient is a 62-year-old female who is not feeling good for about a 7-10 day duration. Patient states that she's had a lot of frontal sinus pain and she states this is what she feels when she had sinus infections. Patient's states she is also has a sore throat and a small cough. Patient denies any objective fevers but has felt chilled most the last 24 hours. The patient denies any chest pain, shortness of breath, or any weakness in the extremities or trouble with speaking. Patient does have a past medical history significant for hypertension. Patient has had significant amount of positive sick contacts at work as well as at home. No recent travel history. Patient was put on him Lodine recently by her primary care Adryen Cookson secondary to her hypertension being controlled.  Past Medical History  Diagnosis Date  . Dizziness and giddiness   . Dysuria   . Acute gouty arthropathy   . Iron deficiency anemia, unspecified   . Pure hypercholesterolemia   . Routine general medical examination at a health care facility   . Nonspecific abnormal electrocardiogram (ECG) (EKG)   . Osteoporosis, unspecified   . Unspecified essential hypertension   . Esophageal reflux   . Allergic rhinitis, cause unspecified   . Arthritis     Physical exam Blood pressure 168/88, pulse 79, temperature 97.9 F (36.6 C), temperature source Oral, resp. rate 16, height 5' 8.25" (1.734 m), weight 208 lb (94.348 kg), SpO2 100.00%. Patient's blood pressure initially was 190 systolic it has come down to 168. Gen.: No apparent distress the patient does appear somewhat ill HEENT: Pupils equal reactive to light and accommodation, intraocular movements intact, funduscopic exam normal, Neer's pain with enlarged turbinates with mild erythema. Posterior pharynx does have a positive postnasal drip, patient does have positive tenderness of the frontal  sinuses bilaterally. Tympanic membranes visualized bilaterally and nonerythematous.  Cardiovascular: Regular rate and rhythm no murmur. Pul: Clear to auscultation bilaterally Neurologic: Cranial nerves II through XII intact, neurovascularly intact in all x-rays of 5 out of 5 strength.

## 2013-02-21 NOTE — Assessment & Plan Note (Signed)
Do not note patient's blood pressure is because she is feeling better or if she is feeling that secondary to her blood pressure but she does have signs and symptoms that does correlate with his sinusitis. We will treat as a sinusitis with Augmentin. Patient will take this medication. Patient was warned a potential red flags going to seek medical attention secondary to high blood pressure such as symptoms of stroke or heart attack. Patient at this point did not want further evaluation it appears that she is scheduled for cardiac stress test in the near future.

## 2013-02-21 NOTE — Assessment & Plan Note (Signed)
Patient does have significant hypertension. I still do not think that is well controlled. We will increase her amlodipine 10 mg daily and make sure that she continues to do well. There is a concern for potential compliance. Patient also is on losartan previously which could also be of benefit. Patient is to return to primary care provider in the next 1-2 weeks for further evaluation.

## 2013-02-21 NOTE — Patient Instructions (Addendum)
Very nice to meet you.   I think your headache is from your sinus infection and high blood pressure.  I am giving Augmentin. Take one pill twice a day for the next 10 days. Increase her amlodipine to 2 pills daily. This will be a total of 10 mg. Please either come back and see [place] or your primary care provider in the next one to 2 weeks. Remember if your headache does not seem to improve in the next 72 hours, or any new symptoms you are concerned about that we talked about please come back immediately.

## 2013-02-22 ENCOUNTER — Telehealth: Payer: Self-pay

## 2013-02-22 NOTE — Telephone Encounter (Signed)
Pt was in last night to see dr Antoine Primas and she is not feeling better today would like a work note to be out today and tomorrow and go back Monday please call patient at [phone]

## 2013-02-22 NOTE — Telephone Encounter (Signed)
This is fine. I wrote letter but was unable to print it.  Please see if you are able to. Thanks!

## 2013-02-22 NOTE — Telephone Encounter (Signed)
Pt came to pickup letter.

## 2013-02-22 NOTE — Telephone Encounter (Signed)
Is this ok?

## 2013-03-19 ENCOUNTER — Other Ambulatory Visit: Payer: Self-pay | Admitting: Endocrinology

## 2013-03-19 MED ORDER — AMLODIPINE BESYLATE 5 MG PO TABS: 10.0000 mg | ORAL_TABLET | Freq: Every day | ORAL | Status: DC

## 2013-03-20 ENCOUNTER — Ambulatory Visit (INDEPENDENT_AMBULATORY_CARE_PROVIDER_SITE_OTHER): Payer: BC Managed Care – PPO | Admitting: Endocrinology

## 2013-03-20 ENCOUNTER — Ambulatory Visit
Admission: RE | Admit: 2013-03-20 | Discharge: 2013-03-20 | Disposition: A | Discharge status: Home / Self Care | Payer: BC Managed Care – PPO | Source: Ambulatory Visit | Attending: Endocrinology | Admitting: Endocrinology

## 2013-03-20 ENCOUNTER — Other Ambulatory Visit: Payer: Self-pay | Admitting: *Deleted

## 2013-03-20 ENCOUNTER — Encounter: Payer: Self-pay | Admitting: Endocrinology

## 2013-03-20 VITALS — BP 126/80 | HR 90 | Wt 209.0 lb

## 2013-03-20 DIAGNOSIS — M25561 Pain in right knee: Secondary | ICD-10-CM

## 2013-03-20 DIAGNOSIS — M25569 Pain in unspecified knee: Secondary | ICD-10-CM | POA: Insufficient documentation

## 2013-03-20 MED ORDER — AMLODIPINE BESYLATE 10 MG PO TABS: 10.0000 mg | ORAL_TABLET | Freq: Every day | ORAL | Status: DC

## 2013-03-20 MED ORDER — AMLODIPINE BESYLATE 5 MG PO TABS: 10.0000 mg | ORAL_TABLET | Freq: Every day | ORAL | Status: DC

## 2013-03-20 NOTE — Patient Instructions (Addendum)
Please continue the same amlodipine (10 mg daily) X-rays are being requested for you today.  We'll contact you with results.
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
## 2013-04-13 ENCOUNTER — Ambulatory Visit (INDEPENDENT_AMBULATORY_CARE_PROVIDER_SITE_OTHER): Payer: BC Managed Care – PPO | Admitting: Internal Medicine

## 2013-04-13 VITALS — BP 144/73 | HR 80 | Temp 98.0°F | Resp 16 | Ht 67.0 in | Wt 208.0 lb

## 2013-04-13 DIAGNOSIS — R58 Hemorrhage, not elsewhere classified: Secondary | ICD-10-CM

## 2013-04-13 DIAGNOSIS — E876 Hypokalemia: Secondary | ICD-10-CM

## 2013-04-13 DIAGNOSIS — I998 Other disorder of circulatory system: Secondary | ICD-10-CM

## 2013-04-13 LAB — POCT CBC
Lymph, poc: 2.2 (ref 0.6–3.4)
MCHC: 30.5 g/dL — AB (ref 31.8–35.4)
MID (cbc): 0.5 (ref 0–0.9)
MPV: 8.8 fL (ref 0–99.8)
POC Granulocyte: 1.4 — AB (ref 2–6.9)
POC LYMPH PERCENT: 54.4 %L — AB (ref 10–50)
POC MID %: 11.3 %M (ref 0–12)
Platelet Count, POC: 353 10*3/uL (ref 142–424)
RDW, POC: 14.2 %

## 2013-04-13 NOTE — Progress Notes (Signed)
  Subjective:    Patient ID: Jocelyn Ford, female    DOB: Sep 04, 1951, 62 y.o.   MRN: [id number]  HPI  62 y/o female c/o right lower leg bruising x 2-3 weeks, began as black/blue,  no known injury, painful in the beginning, pain has since diminished, no prior history of unknown bruising. Works in [employer] with unknown bruising occuring often.  Denies fever, abdominal pain, ha, nausea,  Or weight loss. Feels fine, concerned this could be ring worm. Assured her it is not. On chart review, last K 3.2, and hemoglobin was low. Will ck both today for Dr. Everardo All.   Review of Systems See list of problems    Objective:   Physical Exam  Vitals reviewed. Constitutional: She is oriented to person, place, and time. She appears well-developed and well-nourished. No distress.  Eyes: EOM are normal. No scleral icterus.  Cardiovascular: Normal rate, regular rhythm and normal heart sounds.   Pulmonary/Chest: Effort normal and breath sounds normal.  Musculoskeletal: Normal range of motion.  Neurological: She is alert and oriented to person, place, and time. She exhibits normal muscle tone. Coordination normal.  Skin: Bruising and ecchymosis noted.     Resolving bruise          Assessment & Plan:  Mild anemia and low K Resolving bruise lower right leg.

## 2013-04-13 NOTE — Patient Instructions (Addendum)

## 2013-04-14 LAB — BASIC METABOLIC PANEL
Calcium: 9.2 mg/dL (ref 8.4–10.5)
Creat: 1.18 mg/dL — ABNORMAL HIGH (ref 0.50–1.10)
Sodium: 142 mEq/L (ref 135–145)

## 2013-05-10 ENCOUNTER — Other Ambulatory Visit: Payer: Self-pay | Admitting: Internal Medicine

## 2013-06-08 ENCOUNTER — Other Ambulatory Visit: Payer: Self-pay | Admitting: *Deleted

## 2013-06-08 MED ORDER — FUROSEMIDE 20 MG PO TABS: 20.0000 mg | ORAL_TABLET | Freq: Every day | ORAL | Status: DC

## 2013-06-13 ENCOUNTER — Other Ambulatory Visit: Payer: Self-pay

## 2013-06-13 MED ORDER — FUROSEMIDE 20 MG PO TABS: 20.0000 mg | ORAL_TABLET | Freq: Every day | ORAL | Status: DC

## 2013-06-13 MED ORDER — POTASSIUM CHLORIDE CRYS ER 20 MEQ PO TBCR: 20.0000 meq | EXTENDED_RELEASE_TABLET | Freq: Two times a day (BID) | ORAL | Status: DC

## 2013-06-19 ENCOUNTER — Other Ambulatory Visit: Payer: Self-pay | Admitting: *Deleted

## 2013-06-19 MED ORDER — FUROSEMIDE 20 MG PO TABS: 20.0000 mg | ORAL_TABLET | Freq: Every day | ORAL | Status: DC

## 2013-07-09 ENCOUNTER — Other Ambulatory Visit: Payer: Self-pay | Admitting: *Deleted

## 2013-07-09 MED ORDER — ATENOLOL 25 MG PO TABS: 25.0000 mg | ORAL_TABLET | Freq: Two times a day (BID) | ORAL | Status: DC

## 2013-07-09 NOTE — Telephone Encounter (Signed)
Rx request to pharmacy/SLS  

## 2013-08-13 ENCOUNTER — Other Ambulatory Visit: Payer: Self-pay

## 2013-08-13 MED ORDER — OMEPRAZOLE 40 MG PO CPDR: 40.0000 mg | DELAYED_RELEASE_CAPSULE | Freq: Every day | ORAL | Status: DC

## 2013-08-19 ENCOUNTER — Ambulatory Visit (INDEPENDENT_AMBULATORY_CARE_PROVIDER_SITE_OTHER): Payer: BC Managed Care – PPO | Admitting: Family Medicine

## 2013-08-19 VITALS — BP 144/86 | HR 84 | Temp 98.0°F | Resp 17 | Ht 67.5 in | Wt 201.0 lb

## 2013-08-19 DIAGNOSIS — M109 Gout, unspecified: Secondary | ICD-10-CM

## 2013-08-19 MED ORDER — HYDROCODONE-ACETAMINOPHEN 5-325 MG PO TABS: 1.0000 | ORAL_TABLET | Freq: Four times a day (QID) | ORAL | Status: DC | PRN

## 2013-08-19 MED ORDER — PREDNISONE 20 MG PO TABS: ORAL_TABLET | ORAL | Status: DC

## 2013-08-19 MED ORDER — ALLOPURINOL 100 MG PO TABS: 100.0000 mg | ORAL_TABLET | Freq: Every day | ORAL | Status: DC

## 2013-08-19 NOTE — Patient Instructions (Addendum)
Gout  Gout is an inflammatory condition (arthritis) caused by a buildup of uric acid crystals in the joints. Uric acid is a chemical that is normally present in the blood. Under some circumstances, uric acid can form into crystals in your joints. This causes joint redness, soreness, and swelling (inflammation). Repeat attacks are common. Over time, uric acid crystals can form into masses (tophi) near a joint, causing disfigurement. Gout is treatable and often preventable.  CAUSES   The disease begins with elevated levels of uric acid in the blood. Uric acid is produced by your body when it breaks down a naturally found substance called purines. This also happens when you eat certain foods such as meats and fish. Causes of an elevated uric acid level include:   Being passed down from parent to child (heredity).   Diseases that cause increased uric acid production (obesity, psoriasis, some cancers).   Excessive alcohol use.   Diet, especially diets rich in meat and seafood.   Medicines, including certain cancer-fighting drugs (chemotherapy), diuretics, and aspirin.   Chronic kidney disease. The kidneys are no longer able to remove uric acid well.   Problems with metabolism.  Conditions strongly associated with gout include:   Obesity.   High blood pressure.   High cholesterol.   Diabetes.  Not everyone with elevated uric acid levels gets gout. It is not understood why some people get gout and others do not. Surgery, joint injury, and eating too much of certain foods are some of the factors that can lead to gout.  SYMPTOMS    An attack of gout comes on quickly. It causes intense pain with redness, swelling, and warmth in a joint.   Fever can occur.   Often, only one joint is involved. Certain joints are more commonly involved:   Base of the big toe.   Knee.   Ankle.   Wrist.   Finger.  Without treatment, an attack usually goes away in a few days to weeks. Between attacks, you usually will not have  symptoms, which is different from many other forms of arthritis.  DIAGNOSIS   Your caregiver will suspect gout based on your symptoms and exam. Removal of fluid from the joint (arthrocentesis) is done to check for uric acid crystals. Your caregiver will give you a medicine that numbs the area (local anesthetic) and use a needle to remove joint fluid for exam. Gout is confirmed when uric acid crystals are seen in joint fluid, using a special microscope. Sometimes, blood, urine, and X-ray tests are also used.  TREATMENT   There are 2 phases to gout treatment: treating the sudden onset (acute) attack and preventing attacks (prophylaxis).  Treatment of an Acute Attack   Medicines are used. These include anti-inflammatory medicines or steroid medicines.   An injection of steroid medicine into the affected joint is sometimes necessary.   The painful joint is rested. Movement can worsen the arthritis.   You may use warm or cold treatments on painful joints, depending which works best for you.   Discuss the use of coffee, vitamin C, or cherries with your caregiver. These may be helpful treatment options.  Treatment to Prevent Attacks  After the acute attack subsides, your caregiver may advise prophylactic medicine. These medicines either help your kidneys eliminate uric acid from your body or decrease your uric acid production. You may need to stay on these medicines for a very long time.  The early phase of treatment with prophylactic medicine can be associated   with an increase in acute gout attacks. For this reason, during the first few months of treatment, your caregiver may also advise you to take medicines usually used for acute gout treatment. Be sure you understand your caregiver's directions.  You should also discuss dietary treatment with your caregiver. Certain foods such as meats and fish can increase uric acid levels. Other foods such as dairy can decrease levels. Your caregiver can give you a list of foods  to avoid.  HOME CARE INSTRUCTIONS    Do not take aspirin to relieve pain. This raises uric acid levels.   Only take over-the-counter or prescription medicines for pain, discomfort, or fever as directed by your caregiver.   Rest the joint as much as possible. When in bed, keep sheets and blankets off painful areas.   Keep the affected joint raised (elevated).   Use crutches if the painful joint is in your leg.   Drink enough water and fluids to keep your urine clear or pale yellow. This helps your body get rid of uric acid. Do not drink alcoholic beverages. They slow the passage of uric acid.   Follow your caregiver's dietary instructions. Pay careful attention to the amount of protein you eat. Your daily diet should emphasize fruits, vegetables, whole grains, and fat-free or low-fat milk products.   Maintain a healthy body weight.  SEEK MEDICAL CARE IF:    You have an oral temperature above 102 F (38.9 C).   You develop diarrhea, vomiting, or any side effects from medicines.   You do not feel better in 24 hours, or you are getting worse.  SEEK IMMEDIATE MEDICAL CARE IF:    Your joint becomes suddenly more tender and you have:   Chills.   An oral temperature above 102 F (38.9 C), not controlled by medicine.  MAKE SURE YOU:    Understand these instructions.   Will watch your condition.   Will get help right away if you are not doing well or get worse.  Document Released: 12/03/2000 Document Revised: 02/28/2012 Document Reviewed: 03/16/2010  Exit[employer] Patient Information 2014 [employer].

## 2013-08-19 NOTE — Progress Notes (Signed)
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  She has an acute right great toe gouty attack for the last 24 hours.  Objective:  NAD Right MTP joint of great toe is swollen, red and tender  Gout attack - Plan: predniSONE (DELTASONE) 20 MG tablet, allopurinol (ZYLOPRIM) 100 MG tablet, HYDROcodone-acetaminophen (NORCO) 5-325 MG per tablet  Gout - Plan: allopurinol (ZYLOPRIM) 100 MG tablet, HYDROcodone-acetaminophen (NORCO) 5-325 MG per tablet  Signed, Elvina Sidle, MD

## 2013-08-20 ENCOUNTER — Telehealth: Payer: Self-pay

## 2013-08-20 NOTE — Telephone Encounter (Signed)
Note provided. If not able to return after tomorrow, she should return to clinic.

## 2013-08-20 NOTE — Telephone Encounter (Signed)
Pt saw Dr. Elbert Ewings on Sunday for gout.  She asked for a work note for Tuesday, but he told her that her foot should be down by Tuesday.  She has to wear steal-toed shoes and says she still can't get her foot in it today.  Can we write her a note for tomorrow just in case?   [phone]

## 2013-08-21 ENCOUNTER — Ambulatory Visit (INDEPENDENT_AMBULATORY_CARE_PROVIDER_SITE_OTHER): Payer: BC Managed Care – PPO | Admitting: Endocrinology

## 2013-08-21 ENCOUNTER — Encounter: Payer: Self-pay | Admitting: Endocrinology

## 2013-08-21 VITALS — BP 136/80 | HR 90 | Ht 67.0 in | Wt 201.0 lb

## 2013-08-21 DIAGNOSIS — M109 Gout, unspecified: Secondary | ICD-10-CM

## 2013-08-21 DIAGNOSIS — I10 Essential (primary) hypertension: Secondary | ICD-10-CM

## 2013-08-21 DIAGNOSIS — D509 Iron deficiency anemia, unspecified: Secondary | ICD-10-CM

## 2013-08-21 DIAGNOSIS — E7800 Pure hypercholesterolemia, unspecified: Secondary | ICD-10-CM

## 2013-08-21 DIAGNOSIS — M810 Age-related osteoporosis without current pathological fracture: Secondary | ICD-10-CM

## 2013-08-21 DIAGNOSIS — Z79899 Other long term (current) drug therapy: Secondary | ICD-10-CM

## 2013-08-21 LAB — BASIC METABOLIC PANEL
BUN: 19 mg/dL (ref 6–23)
Chloride: 100 mEq/L (ref 96–112)
GFR: 62.41 mL/min (ref >60.00)
Glucose, Bld: 105 mg/dL — ABNORMAL HIGH (ref 70–99)
Potassium: 3.4 mEq/L — ABNORMAL LOW (ref 3.5–5.1)
Sodium: 137 mEq/L (ref 135–145)

## 2013-08-21 LAB — URINALYSIS, ROUTINE W REFLEX MICROSCOPIC
Ketones, ur: NEGATIVE
Leukocytes, UA: NEGATIVE
Nitrite: NEGATIVE
Specific Gravity, Urine: 1.01 (ref 1.000–1.030)
Urobilinogen, UA: 0.2 (ref 0.0–1.0)
pH: 6 (ref 5.0–8.0)

## 2013-08-21 LAB — HEPATIC FUNCTION PANEL
AST: 13 U/L (ref 0–37)
Alkaline Phosphatase: 66 U/L (ref 39–117)
Total Bilirubin: 0.3 mg/dL (ref 0.3–1.2)

## 2013-08-21 LAB — CBC WITH DIFFERENTIAL/PLATELET
Basophils Relative: 0 % (ref 0.0–3.0)
Eosinophils Absolute: 0 10*3/uL (ref 0.0–0.7)
Eosinophils Relative: 0 % (ref 0.0–5.0)
Hemoglobin: 12.3 g/dL (ref 12.0–15.0)
Lymphocytes Relative: 13.3 % (ref 12.0–46.0)
MCHC: 32.9 g/dL (ref 30.0–36.0)
Neutro Abs: 9.9 10*3/uL — ABNORMAL HIGH (ref 1.4–7.7)
RBC: 4.49 Mil/uL (ref 3.87–5.11)
WBC: 11.8 10*3/uL — ABNORMAL HIGH (ref 4.5–10.5)

## 2013-08-21 LAB — IBC PANEL
Saturation Ratios: 17.4 % — ABNORMAL LOW (ref 20.0–50.0)
Transferrin: 230.4 mg/dL (ref 212.0–360.0)

## 2013-08-21 LAB — LIPID PANEL: Cholesterol: 259 mg/dL — ABNORMAL HIGH (ref 0–200)

## 2013-08-21 LAB — TSH: TSH: 0.68 u[IU]/mL (ref 0.35–5.50)

## 2013-08-21 NOTE — Progress Notes (Signed)
Subjective:    Patient ID: Jocelyn Ford, female    DOB: 07-07-1951, 62 y.o.   MRN: [id number]  HPI Pt was seen at urgent care for a moderate case of acute gout, at the right foot.  It is much improved, but still very painful.  No assoc fever.  Past Medical History  Diagnosis Date  . Dizziness and giddiness   . Dysuria   . Acute gouty arthropathy   . Iron deficiency anemia, unspecified   . Pure hypercholesterolemia   . Routine general medical examination at a health care facility   . Nonspecific abnormal electrocardiogram (ECG) (EKG)   . Osteoporosis, unspecified   . Unspecified essential hypertension   . Esophageal reflux   . Allergic rhinitis, cause unspecified   . Arthritis     Past Surgical History  Procedure Laterality Date  . Abdominal hysterectomy      History   Social History  . Marital Status: Divorced    Spouse Name: N/A    Number of Children: N/A  . Years of Education: N/A   Occupational History  . Not on file.   Social History Main Topics  . Smoking status: Never Smoker   . Smokeless tobacco: Not on file  . Alcohol Use: No  . Drug Use: No  . Sexual Activity: No   Other Topics Concern  . Not on file   Social History Narrative  . No narrative on file    Current Outpatient Prescriptions on File Prior to Visit  Medication Sig Dispense Refill  . allopurinol (ZYLOPRIM) 100 MG tablet Take 1 tablet (100 mg total) by mouth daily.  30 tablet  6  . amLODipine (NORVASC) 5 MG tablet Take 2 tablets (10 mg total) by mouth daily.  60 tablet  11  . atenolol (TENORMIN) 25 MG tablet Take 1 tablet (25 mg total) by mouth 2 (two) times daily.  60 tablet  5  . furosemide (LASIX) 20 MG tablet Take 1 tablet (20 mg total) by mouth daily.  30 tablet  2  . HYDROcodone-acetaminophen (NORCO) 5-325 MG per tablet Take 1 tablet by mouth every 6 (six) hours as needed for pain.  20 tablet  0  . omeprazole (PRILOSEC) 40 MG capsule Take 1 capsule (40 mg total) by mouth daily.   30 capsule  6  . potassium chloride SA (K-DUR,KLOR-CON) 20 MEQ tablet Take 1 tablet (20 mEq total) by mouth 2 (two) times daily.  60 tablet  11  . predniSONE (DELTASONE) 20 MG tablet Two daily with food  10 tablet  0   No current facility-administered medications on file prior to visit.    Allergies  Allergen Reactions  . Nifedipine     REACTION: Nausea,weakness    Family History  Problem Relation Age of Onset  . Cancer Father     Prostate    BP 136/80  Pulse 90  Ht 5\' 7"  (1.702 m)  Wt 201 lb (91.173 kg)  BMI 31.47 kg/m2  SpO2 98%   Review of Systems She has lost a few lbs.  She has nausea and diarrhea since on gout meds.    Objective:   Physical Exam VITAL SIGNS:  See vs page GENERAL: no distress Right foot: moderate swelling and tenderness at the instep area, but no warmth. Lab Results  Component Value Date   WBC 11.8* 08/21/2013   HGB 12.3 08/21/2013   HCT 37.3 08/21/2013   PLT 389.0 08/21/2013   GLUCOSE 105* 08/21/2013   CHOL  259* 08/21/2013   TRIG 97.0 08/21/2013   HDL 58.90 08/21/2013   LDLDIRECT 194.6 08/21/2013   LDLCALC 120* 01/13/2010   ALT 13 08/21/2013   AST 13 08/21/2013   NA 137 08/21/2013   K 3.4* 08/21/2013   CL 100 08/21/2013   CREATININE 1.1 08/21/2013   BUN 19 08/21/2013   CO2 29 08/21/2013   TSH 0.68 08/21/2013      Assessment & Plan:  Acute gouty attack, improved Dyslipidemia: she needs increased rx Leukocytosis, prob due to prednisone and acute inflammation

## 2013-08-21 NOTE — Patient Instructions (Addendum)
blood tests are being requested for you today.  We'll contact you with results. Please come in soon for a regular physical.  You will get better much faster if you elevate your foot above the rest of your body.

## 2013-08-22 LAB — PTH, INTACT AND CALCIUM: PTH: 81 pg/mL — ABNORMAL HIGH (ref 14.0–72.0)

## 2013-08-29 DIAGNOSIS — Z0279 Encounter for issue of other medical certificate: Secondary | ICD-10-CM

## 2013-09-03 ENCOUNTER — Other Ambulatory Visit: Payer: Self-pay | Admitting: *Deleted

## 2013-09-03 MED ORDER — FUROSEMIDE 20 MG PO TABS: 20.0000 mg | ORAL_TABLET | Freq: Every day | ORAL | Status: DC

## 2013-09-06 ENCOUNTER — Telehealth: Payer: Self-pay

## 2013-09-06 NOTE — Telephone Encounter (Signed)
Pt has left 3 voice-mails, finally was able to reach pt, and she would like to speak with you "regarding a matter".

## 2013-09-06 NOTE — Telephone Encounter (Signed)
i called pt today.  i left message to call back

## 2013-09-13 DIAGNOSIS — Z0279 Encounter for issue of other medical certificate: Secondary | ICD-10-CM

## 2013-09-15 ENCOUNTER — Other Ambulatory Visit: Payer: Self-pay | Admitting: Endocrinology

## 2013-10-03 ENCOUNTER — Ambulatory Visit (INDEPENDENT_AMBULATORY_CARE_PROVIDER_SITE_OTHER): Payer: BC Managed Care – PPO | Admitting: Endocrinology

## 2013-10-03 ENCOUNTER — Encounter: Payer: Self-pay | Admitting: Endocrinology

## 2013-10-03 VITALS — BP 134/72 | HR 89 | Ht 67.0 in | Wt 206.0 lb

## 2013-10-03 DIAGNOSIS — E876 Hypokalemia: Secondary | ICD-10-CM | POA: Insufficient documentation

## 2013-10-03 DIAGNOSIS — Z0000 Encounter for general adult medical examination without abnormal findings: Secondary | ICD-10-CM

## 2013-10-03 MED ORDER — ATORVASTATIN CALCIUM 40 MG PO TABS: 40.0000 mg | ORAL_TABLET | Freq: Every day | ORAL | Status: DC

## 2013-10-03 NOTE — Progress Notes (Signed)
Subjective:    Patient ID: Jocelyn Ford, female    DOB: 1951/09/26, 62 y.o.   MRN: [id number]  HPI Pt is here for regular wellness examination, and is feeling pretty well in general, and says chronic med probs are stable, except as noted below Past Medical History  Diagnosis Date  . Dizziness and giddiness   . Dysuria   . Acute gouty arthropathy   . Iron deficiency anemia, unspecified   . Pure hypercholesterolemia   . Routine general medical examination at a health care facility   . Nonspecific abnormal electrocardiogram (ECG) (EKG)   . Osteoporosis, unspecified   . Unspecified essential hypertension   . Esophageal reflux   . Allergic rhinitis, cause unspecified   . Arthritis     Past Surgical History  Procedure Laterality Date  . Abdominal hysterectomy      History   Social History  . Marital Status: Divorced    Spouse Name: N/A    Number of Children: N/A  . Years of Education: N/A   Occupational History  . Not on file.   Social History Main Topics  . Smoking status: Never Smoker   . Smokeless tobacco: Not on file  . Alcohol Use: No  . Drug Use: No  . Sexual Activity: No   Other Topics Concern  . Not on file   Social History Narrative  . No narrative on file    Current Outpatient Prescriptions on File Prior to Visit  Medication Sig Dispense Refill  . allopurinol (ZYLOPRIM) 100 MG tablet Take 1 tablet (100 mg total) by mouth daily.  30 tablet  6  . amLODipine (NORVASC) 5 MG tablet Take 2 tablets (10 mg total) by mouth daily.  60 tablet  11  . atenolol (TENORMIN) 25 MG tablet Take 1 tablet (25 mg total) by mouth 2 (two) times daily.  60 tablet  5  . furosemide (LASIX) 20 MG tablet Take 1 tablet (20 mg total) by mouth daily.  30 tablet  2  . omeprazole (PRILOSEC) 40 MG capsule Take 1 capsule (40 mg total) by mouth daily.  30 capsule  6  . potassium chloride SA (K-DUR,KLOR-CON) 20 MEQ tablet Take 1 tablet (20 mEq total) by mouth 2 (two) times daily.  60  tablet  11   No current facility-administered medications on file prior to visit.    Allergies  Allergen Reactions  . Nifedipine     REACTION: Nausea,weakness    Family History  Problem Relation Age of Onset  . Cancer Father     Prostate    BP 134/72  Pulse 89  Ht 5\' 7"  (1.702 m)  Wt 206 lb (93.441 kg)  BMI 32.26 kg/m2  SpO2 97%  Review of Systems  Constitutional: Negative for fever and unexpected weight change.  HENT: Negative for hearing loss.   Eyes: Negative for visual disturbance.  Respiratory: Negative for shortness of breath.   Cardiovascular: Negative for chest pain.  Gastrointestinal: Negative for anal bleeding.  Endocrine: Negative for cold intolerance.  Genitourinary: Negative for hematuria.  Musculoskeletal: Negative for back pain.  Skin: Negative for rash.  Allergic/Immunologic: Positive for environmental allergies.  Neurological: Positive for headaches. Negative for syncope and numbness.  Hematological: Does not bruise/bleed easily.  Psychiatric/Behavioral: Negative for dysphoric mood.       Objective:   Physical Exam VS: see vs page GEN: no distress HEAD: head: no deformity eyes: no periorbital swelling, no proptosis external nose and ears are normal mouth: no lesion  seen NECK: supple, thyroid is not enlarged CHEST WALL: no deformity LUNGS:  Clear to auscultation BREASTS:  sees gyn CV: reg rate and rhythm, no murmur ABD: abdomen is soft, nontender.  no hepatosplenomegaly.  not distended.  no hernia GENITALIA/RECTAL: sees gyn MUSCULOSKELETAL: muscle bulk and strength are grossly normal.  no obvious joint swelling.  gait is normal and steady EXTEMITIES: no deformity.  no ulcer on the feet.  feet are of normal color and temp.  no edema PULSES: dorsalis pedis intact bilat.  no carotid bruit NEURO:  cn 2-12 grossly intact.   readily moves all 4's.  sensation is intact to touch on the feet SKIN:  Normal texture and temperature.  No rash or  suspicious lesion is visible.   NODES:  None palpable at the neck PSYCH: alert, oriented x3.  Does not appear anxious nor depressed.     Assessment & Plan:  Wellness visit today, with problems stable, except as noted. we discussed code status.  pt requests full code, but would not want to be started or maintained on artificial life-support measures if there was not a reasonable chance of recovery

## 2013-10-03 NOTE — Patient Instructions (Signed)
please consider these measures for your health:  minimize alcohol.  do not use tobacco products.  have a colonoscopy at least every 10 years from age 60.  Women should have an annual mammogram from age 86.  keep firearms safely stored.  always use seat belts.  have working smoke alarms in your home.  see an eye doctor and dentist regularly.  never drive under the influence of alcohol or drugs (including prescription drugs).   you will receive a phone call, about a day and time for an appointment, for the colonoscopy. i have sent a prescription to your pharmacy, for the cholesterol. Please redo the blood tests in 1 month.

## 2013-10-14 ENCOUNTER — Other Ambulatory Visit: Payer: Self-pay | Admitting: Endocrinology

## 2013-10-25 ENCOUNTER — Other Ambulatory Visit: Payer: Self-pay

## 2013-10-31 ENCOUNTER — Other Ambulatory Visit: Payer: BC Managed Care – PPO

## 2013-10-31 ENCOUNTER — Other Ambulatory Visit: Payer: Self-pay | Admitting: Endocrinology

## 2013-10-31 DIAGNOSIS — E876 Hypokalemia: Secondary | ICD-10-CM

## 2013-10-31 DIAGNOSIS — E7800 Pure hypercholesterolemia, unspecified: Secondary | ICD-10-CM

## 2013-11-01 ENCOUNTER — Ambulatory Visit (INDEPENDENT_AMBULATORY_CARE_PROVIDER_SITE_OTHER): Payer: BC Managed Care – PPO | Admitting: Family Medicine

## 2013-11-01 VITALS — BP 180/92 | HR 87 | Temp 98.2°F | Resp 17 | Ht 68.5 in | Wt 207.0 lb

## 2013-11-01 DIAGNOSIS — I10 Essential (primary) hypertension: Secondary | ICD-10-CM

## 2013-11-01 DIAGNOSIS — R059 Cough, unspecified: Secondary | ICD-10-CM

## 2013-11-01 DIAGNOSIS — J209 Acute bronchitis, unspecified: Secondary | ICD-10-CM

## 2013-11-01 DIAGNOSIS — R05 Cough: Secondary | ICD-10-CM

## 2013-11-01 MED ORDER — HYDROCODONE-HOMATROPINE 5-1.5 MG/5ML PO SYRP: 5.0000 mL | ORAL_SOLUTION | ORAL | Status: DC | PRN

## 2013-11-01 MED ORDER — AZITHROMYCIN 250 MG PO TABS: ORAL_TABLET | ORAL | Status: DC

## 2013-11-01 NOTE — Progress Notes (Signed)
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 She has had a bad cough which she started a week ago. She has not been running a fever. This started as a cough. She's had a little runny nose and sore throat, but the consistent thing is been the incessant cough. She is coughing at night and not able to rest well. She is coughing up a lot of phlegm now. She does not smoke. She does have a history of having a fair number of respiratory tract infections. She had amoxicillin a couple of weeks ago for a dental infection.  She is on medication for blood pressure, and thinks that the OTC cough and cold products he is taking his prior raise her blood pressure some. Her pressure was good 2 weeks ago at her physical.  Objective: Pleasant alert lady who is coughing nonstop. Her TMs are normal. Throat has a little swelling of the uvula which is dangling. The neck was supple without significant nodes. Chest is clear to auscultation. Heart regular without murmurs. No wheezing, and even on forced expiration she is not wheezing.  Assessment: Bronchitis Hypertension  Plan: Will use Zithromax that she just had a course of amoxicillin. Hycodan for the cough

## 2013-11-01 NOTE — Patient Instructions (Signed)
Drink plenty of fluids and get enough rest  Take the cough syrup, Hycodan, 1 teaspoon every 4-6 hours as needed for cough. It may make your little bit drowsy and you may wish to only take a half of a teaspoon when at work.   take the azithromycin 2 pills initially, then one daily for 4 days for infection  Monitor your blood pressure. If it continues to run high he should speak your primary care or [place] about it.  Avoid decongestants which may raise her blood pressure further.  Return if further problems or not improving

## 2013-12-10 ENCOUNTER — Other Ambulatory Visit: Payer: Self-pay | Admitting: *Deleted

## 2013-12-10 MED ORDER — FUROSEMIDE 20 MG PO TABS: 20.0000 mg | ORAL_TABLET | Freq: Every day | ORAL | Status: DC

## 2014-01-04 ENCOUNTER — Other Ambulatory Visit: Payer: Self-pay | Admitting: Endocrinology

## 2014-01-22 ENCOUNTER — Other Ambulatory Visit: Payer: Self-pay | Admitting: Endocrinology

## 2014-03-01 ENCOUNTER — Ambulatory Visit (INDEPENDENT_AMBULATORY_CARE_PROVIDER_SITE_OTHER): Payer: BC Managed Care – PPO | Admitting: Physician Assistant

## 2014-03-01 VITALS — BP 128/80 | HR 70 | Temp 98.0°F | Resp 17 | Ht 68.0 in | Wt 210.0 lb

## 2014-03-01 DIAGNOSIS — J069 Acute upper respiratory infection, unspecified: Secondary | ICD-10-CM

## 2014-03-01 DIAGNOSIS — B9789 Other viral agents as the cause of diseases classified elsewhere: Principal | ICD-10-CM

## 2014-03-01 MED ORDER — BENZONATATE 100 MG PO CAPS: 100.0000 mg | ORAL_CAPSULE | Freq: Three times a day (TID) | ORAL | Status: DC | PRN

## 2014-03-01 MED ORDER — IPRATROPIUM BROMIDE 0.03 % NA SOLN: 2.0000 | Freq: Two times a day (BID) | NASAL | Status: DC

## 2014-03-01 NOTE — Progress Notes (Signed)
   Subjective:    Patient ID: Jocelyn Ford, female    DOB: 06-Jan-1951, 63 y.o.   MRN: [id number]   PCP: Renato Shin, MD  Chief Complaint  Patient presents with  . Cough    green mucus   . Sore Throat    Medications, allergies, past medical history, surgical history, family history, social history and problem list reviewed and updated.  HPI Symptoms began yesterday. Chest discomfort, cough, sore throat and post-nasal drip.  Phlegm is green. Not really achy all over, but has a HA. Felt hot this morning, but isn't sure if she had a fever. This morning, she feels "bad."  Had GI virus a few or so ago, now resolved.   History of bronchitis.  Review of Systems No SOB.  No chest pain, feels 'tight." As above.    Objective:   Physical Exam Blood pressure 128/80, pulse 70, temperature 98 F (36.7 C), temperature source Oral, resp. rate 17, height 5\' 8"  (1.727 m), weight 210 lb (95.255 kg), SpO2 98.00%. Body mass index is 31.94 kg/(m^2). Well-developed, well nourished BF who is awake, alert and oriented, in NAD. HEENT: [place]/AT, PERRL, EOMI.  Sclera and conjunctiva are clear.  EAC are patent, TMs are normal in appearance. Nasal mucosa is congested, pink and moist. OP is clear. Neck: supple, non-tender, no lymphadenopathy, thyromegaly. Heart: RRR, no murmur Lungs: normal effort, CTA Extremities: no cyanosis, clubbing or edema. Skin: warm and dry without rash. Psychologic: good mood and appropriate affect, normal speech and behavior.        Assessment & Plan:  1. Viral URI with cough Supportive care.  Anticipatory guidance.  RTC if symptoms worsen/persist. - ipratropium (ATROVENT) 0.03 % nasal spray; Place 2 sprays into both nostrils 2 (two) times daily.  Dispense: 30 mL; Refill: 0 - benzonatate (TESSALON) 100 MG capsule; Take 1-2 capsules (100-200 mg total) by mouth 3 (three) times daily as needed for cough.  Dispense: 40 capsule; Refill: 0   Fara Chute,  PA-C Physician Assistant-Certified [facility]

## 2014-03-01 NOTE — Patient Instructions (Signed)
Get plenty of rest and drink at least 64 ounces of water daily. 

## 2014-03-09 ENCOUNTER — Other Ambulatory Visit: Payer: Self-pay | Admitting: Endocrinology

## 2014-03-09 ENCOUNTER — Other Ambulatory Visit: Payer: Self-pay | Admitting: Family Medicine

## 2014-03-11 ENCOUNTER — Other Ambulatory Visit: Payer: Self-pay | Admitting: Family Medicine

## 2014-03-11 ENCOUNTER — Other Ambulatory Visit: Payer: Self-pay | Admitting: Endocrinology

## 2014-03-13 ENCOUNTER — Other Ambulatory Visit: Payer: Self-pay | Admitting: Family Medicine

## 2014-03-22 ENCOUNTER — Other Ambulatory Visit: Payer: Self-pay | Admitting: Endocrinology

## 2014-04-08 ENCOUNTER — Other Ambulatory Visit: Payer: Self-pay | Admitting: Endocrinology

## 2014-04-18 ENCOUNTER — Other Ambulatory Visit: Payer: Self-pay | Admitting: Physician Assistant

## 2014-06-07 ENCOUNTER — Other Ambulatory Visit: Payer: Self-pay | Admitting: Endocrinology

## 2014-06-22 ENCOUNTER — Ambulatory Visit (INDEPENDENT_AMBULATORY_CARE_PROVIDER_SITE_OTHER): Payer: BC Managed Care – PPO | Admitting: Family Medicine

## 2014-06-22 VITALS — BP 142/80 | HR 91 | Temp 98.6°F | Resp 18 | Ht 67.0 in | Wt 209.0 lb

## 2014-06-22 DIAGNOSIS — R05 Cough: Secondary | ICD-10-CM

## 2014-06-22 DIAGNOSIS — J0120 Acute ethmoidal sinusitis, unspecified: Secondary | ICD-10-CM

## 2014-06-22 DIAGNOSIS — J209 Acute bronchitis, unspecified: Secondary | ICD-10-CM

## 2014-06-22 DIAGNOSIS — R059 Cough, unspecified: Secondary | ICD-10-CM

## 2014-06-22 MED ORDER — HYDROCODONE-HOMATROPINE 5-1.5 MG/5ML PO SYRP: 5.0000 mL | ORAL_SOLUTION | Freq: Three times a day (TID) | ORAL | Status: DC | PRN

## 2014-06-22 MED ORDER — CEFDINIR 300 MG PO CAPS: 300.0000 mg | ORAL_CAPSULE | Freq: Two times a day (BID) | ORAL | Status: DC

## 2014-06-22 NOTE — Progress Notes (Signed)
[facility] [address] [place] [address] [phone]  Date:  06/22/2014   Name:  Jocelyn Ford   DOB:  Dec 14, 1951   MRN:  [id number]  PCP:  Renato Shin, MD    Chief Complaint: Cough and Facial Pain   History of Present Illness:  Jocelyn Ford is a 63 y.o. very pleasant female patient who presents with the following:  Here today with illness.  She has noted a cough and nasal congestion. Sinus pressure for about one week.  She is getting worse.  The cough will wake her up at night.  She is coughing up a lot of phlegm.  Would like something to take for cough.   She is not sure of temperature, but has felt feverish.  No body aches, she does have fatigue.  She did have a ST, but this is now resolved.  No GI symptoms PCP is Dr. Loanne Drilling  Patient Active Problem List   Diagnosis Date Noted  . Routine general medical examination at a health care facility 10/03/2013  . Hypokalemia 10/03/2013  . Encounter for long-term (current) use of other medications 08/21/2013  . ACUTE GOUTY ARTHROPATHY 07/18/2009  . ANEMIA, IRON DEFICIENCY 11/07/2008  . HYPERCHOLESTEROLEMIA 11/01/2008  . ELECTROCARDIOGRAM, ABNORMAL 11/01/2008  . HYPERTENSION 07/28/2007  . ALLERGIC RHINITIS 07/28/2007  . GERD 07/28/2007  . OSTEOPOROSIS 07/28/2007    Past Medical History  Diagnosis Date  . Dizziness and giddiness   . Dysuria   . Acute gouty arthropathy   . Iron deficiency anemia, unspecified   . Pure hypercholesterolemia   . Routine general medical examination at a health care facility   . Nonspecific abnormal electrocardiogram (ECG) (EKG)   . Osteoporosis, unspecified   . Unspecified essential hypertension   . Esophageal reflux   . Allergic rhinitis, cause unspecified   . Arthritis     Past Surgical History  Procedure Laterality Date  . Abdominal hysterectomy      History  Substance Use Topics  . Smoking status: Never Smoker   . Smokeless tobacco: Never Used   . Alcohol Use: No    Family History  Problem Relation Age of Onset  . Cancer Father     Prostate    Allergies  Allergen Reactions  . Nifedipine     REACTION: Nausea,weakness    Medication list has been reviewed and updated.  Current Outpatient Prescriptions on File Prior to Visit  Medication Sig Dispense Refill  . amLODipine (NORVASC) 10 MG tablet TAKE 1 TABLET (10 MG TOTAL) BY MOUTH DAILY.  90 tablet  3  . atenolol (TENORMIN) 25 MG tablet TAKE 1 TABLET (25 MG TOTAL) BY MOUTH 2 (TWO) TIMES DAILY.  60 tablet  5  . atenolol (TENORMIN) 25 MG tablet TAKE 1 TABLET (25 MG TOTAL) BY MOUTH 2 (TWO) TIMES DAILY.  60 tablet  5  . furosemide (LASIX) 20 MG tablet TAKE 1 TABLET BY MOUTH EVERY DAY  30 tablet  2  . furosemide (LASIX) 20 MG tablet TAKE 1 TABLET BY MOUTH EVERY DAY  30 tablet  2  . omeprazole (PRILOSEC) 40 MG capsule TAKE ONE CAPSULE EVERY DAY  30 capsule  6  . potassium chloride SA (K-DUR,KLOR-CON) 20 MEQ tablet Take 1 tablet (20 mEq total) by mouth 2 (two) times daily.  60 tablet  11  . allopurinol (ZYLOPRIM) 100 MG tablet Take 1 tablet (100 mg total) by mouth daily. PATIENT NEEDS OFFICE VISIT FOR ADDITIONAL REFILLS - 2nd  NOTICE  15 tablet  0  . amLODipine (NORVASC) 5 MG tablet Take 2 tablets (10 mg total) by mouth daily.  60 tablet  11  . benzonatate (TESSALON) 100 MG capsule Take 1-2 capsules (100-200 mg total) by mouth 3 (three) times daily as needed for cough.  40 capsule  0  . ipratropium (ATROVENT) 0.03 % nasal spray Place 2 sprays into both nostrils 2 (two) times daily.  30 mL  0   No current facility-administered medications on file prior to visit.    Review of Systems:  As per HPI- otherwise negative.   Physical Examination: Filed Vitals:   06/22/14 1057  BP: 142/80  Pulse: 91  Temp: 98.6 F (37 C)  Resp: 18   Filed Vitals:   06/22/14 1057  Height: 5\' 7"  (1.702 m)  Weight: 209 lb (94.802 kg)   Body mass index is 32.73 kg/(m^2). Ideal Body Weight:  Weight in (lb) to have BMI = 25: 159.3  GEN: WDWN, NAD, Non-toxic, A & O x 3, overweight, looks well.  Coughing some in room HEENT: Atraumatic, Normocephalic. Neck supple. No masses, No LAD.  Bilateral TM wnl, oropharynx normal.  PEERL,EOMI.   Ears and Nose: No external deformity. CV: RRR, No M/G/R. No JVD. No thrill. No extra heart sounds. PULM: CTA B, no wheezes, crackles, rhonchi. No retractions. No resp. distress. No accessory muscle use.Marland Kitchen EXTR: No c/c/e NEURO Normal gait.  PSYCH: Normally interactive. Conversant. Not depressed or anxious appearing.  Calm demeanor.    Assessment and Plan: Acute ethmoidal sinusitis, recurrence not specified - Plan: cefdinir (OMNICEF) 300 MG capsule  Cough - Plan: HYDROcodone-homatropine (HYCODAN) 5-1.5 MG/5ML syrup  Acute bronchitis, unspecified organism - Plan: cefdinir (OMNICEF) 300 MG capsule  Treat for sinusitis and bronchitis with omnicef and hycodan.  Let me know if not better soon!   Signed Lamar Blinks, MD

## 2014-06-22 NOTE — Patient Instructions (Signed)
We are going to treat you with an antibiotic (omnicef) and cough syrup,.  Remember the syrup can make you sleepy- do not use it when you need to drive.  Let me know if you are not better in the next 2 or 3 days- Sooner if worse.

## 2014-06-23 ENCOUNTER — Telehealth: Payer: Self-pay

## 2014-06-23 DIAGNOSIS — J0120 Acute ethmoidal sinusitis, unspecified: Secondary | ICD-10-CM

## 2014-06-23 NOTE — Telephone Encounter (Signed)
Pt saw [facility] yesterday and says she thinks the omnicef is making her sick.  Please call [phone]

## 2014-06-24 MED ORDER — DOXYCYCLINE HYCLATE 100 MG PO TABS: 100.0000 mg | ORAL_TABLET | Freq: Two times a day (BID) | ORAL | Status: DC

## 2014-06-24 NOTE — Telephone Encounter (Signed)
Called her back- no answer.  LMOM that I am sorry I missed her call, I do want to speak with her to find out more about her rxn to omnicef.  However will rx doxycycline instead

## 2014-06-24 NOTE — Telephone Encounter (Signed)
Spoke to pt- she has stopped taking the [facility] and would like something different called into her pharmacy.

## 2014-06-24 NOTE — Telephone Encounter (Signed)
Operator stated pt called again to see what she should do about taking Abx since it is making her sick. Advised Dr Lorelei Pont is in this morning and we will make sure she sees that message and CB w/instr's. Please advise.

## 2014-06-25 NOTE — Telephone Encounter (Signed)
Called her and was able to reach her today.  She did change over to doxycycline.  She noted stomach cramps with omnicef.  She is still coughing.  She will come and see me later this week if not better

## 2014-06-26 ENCOUNTER — Ambulatory Visit (INDEPENDENT_AMBULATORY_CARE_PROVIDER_SITE_OTHER): Payer: BC Managed Care – PPO

## 2014-06-26 ENCOUNTER — Ambulatory Visit (INDEPENDENT_AMBULATORY_CARE_PROVIDER_SITE_OTHER): Payer: BC Managed Care – PPO | Admitting: Family Medicine

## 2014-06-26 VITALS — BP 140/80 | HR 74 | Temp 98.3°F | Resp 18 | Ht 67.0 in | Wt 209.6 lb

## 2014-06-26 DIAGNOSIS — J029 Acute pharyngitis, unspecified: Secondary | ICD-10-CM

## 2014-06-26 DIAGNOSIS — R059 Cough, unspecified: Secondary | ICD-10-CM

## 2014-06-26 DIAGNOSIS — R05 Cough: Secondary | ICD-10-CM

## 2014-06-26 LAB — CBC WITH DIFFERENTIAL/PLATELET
BASOS ABS: 0 10*3/uL (ref 0.0–0.1)
BASOS PCT: 1 % (ref 0–1)
EOS PCT: 5 % (ref 0–5)
Eosinophils Absolute: 0.2 10*3/uL (ref 0.0–0.7)
HEMATOCRIT: 37.4 % (ref 36.0–46.0)
Hemoglobin: 12.6 g/dL (ref 12.0–15.0)
Lymphocytes Relative: 46 % (ref 12–46)
Lymphs Abs: 2 10*3/uL (ref 0.7–4.0)
MCH: 26.7 pg (ref 26.0–34.0)
MCHC: 33.7 g/dL (ref 30.0–36.0)
MCV: 79.2 fL (ref 78.0–100.0)
MONO ABS: 0.4 10*3/uL (ref 0.1–1.0)
Monocytes Relative: 9 % (ref 3–12)
Neutro Abs: 1.7 10*3/uL (ref 1.7–7.7)
Neutrophils Relative %: 39 % — ABNORMAL LOW (ref 43–77)
PLATELETS: 384 10*3/uL (ref 150–400)
RBC: 4.72 MIL/uL (ref 3.87–5.11)
RDW: 14.2 % (ref 11.5–15.5)
WBC: 4.3 10*3/uL (ref 4.0–10.5)

## 2014-06-26 LAB — POCT RAPID STREP A (OFFICE): Rapid Strep A Screen: NEGATIVE

## 2014-06-26 NOTE — Patient Instructions (Signed)
Continue to use the doxycycline antibiotic, and you can use the cough syrup as needed.  However if the cough syrup seems to give you any stomach trouble stop use.  I do not see any sign of pneumonia or other serious illness at this time. Let me know if you are not feeling improved in the next couple of days- Sooner if worse.

## 2014-06-26 NOTE — Progress Notes (Signed)
[facility] [address], [address] [place] [address] [phone]  Date:  06/26/2014   Name:  Jocelyn Ford   DOB:  Mar 06, 1951   MRN:  [id number]  PCP:  Renato Shin, MD    Chief Complaint: chest tightness, Sore Throat and Cough   History of Present Illness:  Jocelyn Ford is a 63 y.o. very pleasant female patient who presents with the following:  She was seen here 4 days ago with illness.  At that time she had a cough and nasal congestion, sinus pressure. She was treated with hycodan for cough and cefdinir for bronchitis/ sinusitis.   However the omnicef caused stomach cramps so I changed her rx to doxycycline on the 6th. We did not do any labs or x-ray on 7/4.    She notes that last night she had "tightness in her chest" which improved with mucinex.  She "heard some gurgling" but was not sure if it was from her chest or her stomach.  She also notes that she has a ST again.  She is not taking the hycodan because she was not sure if this perhaps caused her stomach cramps instead of the omnicef.   She has a history of HTN- was NOT taking mucinex D due to HTN  Patient Active Problem List   Diagnosis Date Noted  . Routine general medical examination at a health care facility 10/03/2013  . Hypokalemia 10/03/2013  . Encounter for long-term (current) use of other medications 08/21/2013  . ACUTE GOUTY ARTHROPATHY 07/18/2009  . ANEMIA, IRON DEFICIENCY 11/07/2008  . HYPERCHOLESTEROLEMIA 11/01/2008  . ELECTROCARDIOGRAM, ABNORMAL 11/01/2008  . HYPERTENSION 07/28/2007  . ALLERGIC RHINITIS 07/28/2007  . GERD 07/28/2007  . OSTEOPOROSIS 07/28/2007    Past Medical History  Diagnosis Date  . Dizziness and giddiness   . Dysuria   . Acute gouty arthropathy   . Iron deficiency anemia, unspecified   . Pure hypercholesterolemia   . Routine general medical examination at a health care facility   . Nonspecific abnormal electrocardiogram (ECG) (EKG)   . Osteoporosis,  unspecified   . Unspecified essential hypertension   . Esophageal reflux   . Allergic rhinitis, cause unspecified   . Arthritis     Past Surgical History  Procedure Laterality Date  . Abdominal hysterectomy      History  Substance Use Topics  . Smoking status: Never Smoker   . Smokeless tobacco: Never Used  . Alcohol Use: No    Family History  Problem Relation Age of Onset  . Cancer Father     Prostate    Allergies  Allergen Reactions  . Nifedipine     REACTION: Nausea,weakness    Medication list has been reviewed and updated.  Current Outpatient Prescriptions on File Prior to Visit  Medication Sig Dispense Refill  . allopurinol (ZYLOPRIM) 100 MG tablet Take 1 tablet (100 mg total) by mouth daily. PATIENT NEEDS OFFICE VISIT FOR ADDITIONAL REFILLS - 2nd NOTICE  15 tablet  0  . amLODipine (NORVASC) 10 MG tablet TAKE 1 TABLET (10 MG TOTAL) BY MOUTH DAILY.  90 tablet  3  . atenolol (TENORMIN) 25 MG tablet TAKE 1 TABLET (25 MG TOTAL) BY MOUTH 2 (TWO) TIMES DAILY.  60 tablet  5  . benzonatate (TESSALON) 100 MG capsule Take 1-2 capsules (100-200 mg total) by mouth 3 (three) times daily as needed for cough.  40 capsule  0  . doxycycline (VIBRA-TABS) 100 MG tablet Take 1 tablet (100  mg total) by mouth 2 (two) times daily.  20 tablet  0  . furosemide (LASIX) 20 MG tablet TAKE 1 TABLET BY MOUTH EVERY DAY  30 tablet  2  . furosemide (LASIX) 20 MG tablet TAKE 1 TABLET BY MOUTH EVERY DAY  30 tablet  2  . HYDROcodone-homatropine (HYCODAN) 5-1.5 MG/5ML syrup Take 5 mLs by mouth every 8 (eight) hours as needed for cough.  90 mL  0  . ipratropium (ATROVENT) 0.03 % nasal spray Place 2 sprays into both nostrils 2 (two) times daily.  30 mL  0  . omeprazole (PRILOSEC) 40 MG capsule TAKE ONE CAPSULE EVERY DAY  30 capsule  6  . potassium chloride SA (K-DUR,KLOR-CON) 20 MEQ tablet Take 1 tablet (20 mEq total) by mouth 2 (two) times daily.  60 tablet  11   No current facility-administered  medications on file prior to visit.    Review of Systems:  As per HPI- otherwise negative.   Physical Examination: Filed Vitals:   06/26/14 0952  BP: 170/88  Pulse: 74  Temp: 98.3 F (36.8 C)  Resp: 18   Filed Vitals:   06/26/14 0952  Height: 5\' 7"  (1.702 m)  Weight: 209 lb 9.6 oz (95.074 kg)   Body mass index is 32.82 kg/(m^2). Ideal Body Weight: Weight in (lb) to have BMI = 25: 159.3  GEN: WDWN, NAD, Non-toxic, A & O x 3, overweight, looks well HEENT: Atraumatic, Normocephalic. Neck supple. No masses, No LAD.  Bilateral TM wnl, oropharynx normal.  PEERL,EOMI.   Ears and Nose: No external deformity. CV: RRR, No M/G/R. No JVD. No thrill. No extra heart sounds. PULM: CTA B, no wheezes, crackles, rhonchi. No retractions. No resp. distress. No accessory muscle use.Marland Kitchen EXTR: No c/c/e NEURO Normal gait.  PSYCH: Normally interactive. Conversant. Not depressed or anxious appearing.  Calm demeanor.   [facility] reading (PRIMARY) by  Dr. Lorelei Pont. CXR: negative  CHEST 2 VIEW  COMPARISON: None.  FINDINGS: Heart size and mediastinal contours are within normal limits. Both lungs are clear. Visualized skeletal structures are unremarkable.  IMPRESSION: Negative exam.   Results for orders placed in visit on 06/26/14  POCT RAPID STREP A (OFFICE)      Result Value Ref Range   Rapid Strep A Screen Negative  Negative    Assessment and Plan: Cough - Plan: CBC with Differential, DG Chest 2 View  Sore throat - Plan: POCT rapid strep A  Reassurance- she likely just needs more time to get well.  She will continue the doxy and give hycodan another try.   See patient instructions for more details.    Signed Lamar Blinks, MD

## 2014-07-02 ENCOUNTER — Telehealth: Payer: Self-pay

## 2014-07-02 NOTE — Telephone Encounter (Signed)
PT STATES THE MEDICATION SHE WAS GIVEN IS CAUSING HER FACE TO SWELL AND SHE STOP TAKING IT THE OTHER DAY AND IS FEELING SO MUCH BETTER BUT DOESN'T KNOW WHAT TO DO NEXT. [place] [phone]

## 2014-07-02 NOTE — Telephone Encounter (Signed)
LM for rtn call. 

## 2014-07-03 ENCOUNTER — Telehealth: Payer: Self-pay | Admitting: *Deleted

## 2014-07-03 DIAGNOSIS — B9789 Other viral agents as the cause of diseases classified elsewhere: Principal | ICD-10-CM

## 2014-07-03 DIAGNOSIS — J069 Acute upper respiratory infection, unspecified: Secondary | ICD-10-CM

## 2014-07-03 MED ORDER — IPRATROPIUM BROMIDE 0.03 % NA SOLN: 2.0000 | Freq: Two times a day (BID) | NASAL | Status: DC

## 2014-07-03 NOTE — Telephone Encounter (Signed)
Called her back- no answer

## 2014-07-03 NOTE — Telephone Encounter (Signed)
Called her back. She stopped taking the doxycycline after about 5 doses- she seemed to get a headache and "my face started swelling."  She stopped taking the doxycycline about 3 days ago, and these sx are now resolved.  Overall she feels pretty well.  She would like to try waiting it out and see if her sx resolve which is fine.  She would like a RF of her atrovent nasal. She will let me know if not better

## 2014-07-03 NOTE — Telephone Encounter (Signed)
Lm for rtn call 

## 2014-07-03 NOTE — Telephone Encounter (Signed)
Patient took one dose of the doxy- she states that her face swelling went down. Her symptoms have improved. She only has a residual cough. She would like to see if Dr. Lorelei Pont wants to prescribe a different antibiotic. I have added Doxy to her allergy list.

## 2014-07-08 ENCOUNTER — Other Ambulatory Visit: Payer: Self-pay

## 2014-07-08 MED ORDER — POTASSIUM CHLORIDE CRYS ER 20 MEQ PO TBCR: 20.0000 meq | EXTENDED_RELEASE_TABLET | Freq: Two times a day (BID) | ORAL | Status: DC

## 2014-08-07 ENCOUNTER — Ambulatory Visit (INDEPENDENT_AMBULATORY_CARE_PROVIDER_SITE_OTHER): Payer: BC Managed Care – PPO | Admitting: Endocrinology

## 2014-08-07 ENCOUNTER — Encounter: Payer: Self-pay | Admitting: Endocrinology

## 2014-08-07 VITALS — BP 132/72 | HR 83 | Temp 98.0°F | Ht 67.0 in | Wt 209.0 lb

## 2014-08-07 DIAGNOSIS — M109 Gout, unspecified: Secondary | ICD-10-CM

## 2014-08-07 MED ORDER — COLCHICINE 0.6 MG PO TABS: ORAL_TABLET | ORAL | Status: DC

## 2014-08-07 NOTE — Patient Instructions (Addendum)
i have sent a prescription to your pharmacy, to help the symptoms. In 2 weeks, please resume the allopurinol You will get better much faster if you elevate your foot above the rest of your body. I hope you feel better soon.  If you don't feel better by tomorrow, please call back.

## 2014-08-07 NOTE — Progress Notes (Signed)
Subjective:    Patient ID: Jocelyn Ford, female    DOB: [birth year], 63 y.o.   MRN: [id number]  HPI Pt states few days of moderate pain right great toe MTP area, but no assoc fever.  She has not recently taken her allopurinol.   Past Medical History  Diagnosis Date  . Dizziness and giddiness   . Dysuria   . Acute gouty arthropathy   . Iron deficiency anemia, unspecified   . Pure hypercholesterolemia   . Routine general medical examination at a health care facility   . Nonspecific abnormal electrocardiogram (ECG) (EKG)   . Osteoporosis, unspecified   . Unspecified essential hypertension   . Esophageal reflux   . Allergic rhinitis, cause unspecified   . Arthritis     Past Surgical History  Procedure Laterality Date  . Abdominal hysterectomy      History   Social History  . Marital Status: Divorced    Spouse Name: n/a    Number of Children: 1  . Years of Education: 13   Occupational History  . Magazine features editor   Social History Main Topics  . Smoking status: Never Smoker   . Smokeless tobacco: Never Used  . Alcohol Use: No  . Drug Use: No  . Sexual Activity: No   Other Topics Concern  . Not on file   Social History Narrative   Lives alone.  Her daughter lives nearby.    Current Outpatient Prescriptions on File Prior to Visit  Medication Sig Dispense Refill  . allopurinol (ZYLOPRIM) 100 MG tablet Take 1 tablet (100 mg total) by mouth daily. PATIENT NEEDS OFFICE VISIT FOR ADDITIONAL REFILLS - 2nd NOTICE  15 tablet  0  . amLODipine (NORVASC) 10 MG tablet TAKE 1 TABLET (10 MG TOTAL) BY MOUTH DAILY.  90 tablet  3  . atenolol (TENORMIN) 25 MG tablet TAKE 1 TABLET (25 MG TOTAL) BY MOUTH 2 (TWO) TIMES DAILY.  60 tablet  5  . doxycycline (VIBRA-TABS) 100 MG tablet Take 1 tablet (100 mg total) by mouth 2 (two) times daily.  20 tablet  0  . furosemide (LASIX) 20 MG tablet TAKE 1 TABLET BY MOUTH EVERY DAY  30 tablet  2  . HYDROcodone-homatropine  (HYCODAN) 5-1.5 MG/5ML syrup Take 5 mLs by mouth every 8 (eight) hours as needed for cough.  90 mL  0  . ipratropium (ATROVENT) 0.03 % nasal spray Place 2 sprays into both nostrils 2 (two) times daily.  30 mL  4  . omeprazole (PRILOSEC) 40 MG capsule TAKE ONE CAPSULE EVERY DAY  30 capsule  6  . potassium chloride SA (K-DUR,KLOR-CON) 20 MEQ tablet Take 1 tablet (20 mEq total) by mouth 2 (two) times daily.  60 tablet  0   No current facility-administered medications on file prior to visit.    Allergies  Allergen Reactions  . Doxycycline   . Nifedipine     REACTION: Nausea,weakness    Family History  Problem Relation Age of Onset  . Cancer Father     Prostate    BP 132/72  Pulse 83  Temp(Src) 98 F (36.7 C) (Oral)  Ht 5\' 7"  (1.702 m)  Wt 209 lb (94.802 kg)  BMI 32.73 kg/m2  SpO2 97%    Review of Systems Denies numbness and hematuria.    Objective:   Physical Exam VITAL SIGNS:  See vs page GENERAL: no distress Right foot: at the MTP area, there is slight swelling and tenderness, but no  warmth or erythema.        Assessment & Plan:  Acute gouty attack, new  Patient is advised the following: Patient Instructions  i have sent a prescription to your pharmacy, to help the symptoms. In 2 weeks, please resume the allopurinol You will get better much faster if you elevate your foot above the rest of your body. I hope you feel better soon.  If you don't feel better by tomorrow, please call back.

## 2014-08-08 ENCOUNTER — Telehealth: Payer: Self-pay | Admitting: Endocrinology

## 2014-08-08 DIAGNOSIS — M109 Gout, unspecified: Secondary | ICD-10-CM | POA: Insufficient documentation

## 2014-08-08 NOTE — Telephone Encounter (Signed)
Patient is call back to let Dr. Loanne Drilling know that her gout medicine is working.:-)

## 2014-08-09 NOTE — Telephone Encounter (Signed)
Noted  

## 2014-08-19 ENCOUNTER — Other Ambulatory Visit: Payer: Self-pay | Admitting: Endocrinology

## 2014-08-23 ENCOUNTER — Ambulatory Visit: Payer: BC Managed Care – PPO | Admitting: Endocrinology

## 2014-09-17 ENCOUNTER — Other Ambulatory Visit: Payer: Self-pay

## 2014-09-17 MED ORDER — FUROSEMIDE 20 MG PO TABS: ORAL_TABLET | ORAL | Status: DC

## 2014-09-17 MED ORDER — AMLODIPINE BESYLATE 10 MG PO TABS: ORAL_TABLET | ORAL | Status: DC

## 2014-09-17 MED ORDER — ATENOLOL 25 MG PO TABS: ORAL_TABLET | ORAL | Status: DC

## 2014-09-17 MED ORDER — OMEPRAZOLE 40 MG PO CPDR: DELAYED_RELEASE_CAPSULE | ORAL | Status: DC

## 2014-09-17 MED ORDER — POTASSIUM CHLORIDE CRYS ER 20 MEQ PO TBCR: 20.0000 meq | EXTENDED_RELEASE_TABLET | Freq: Two times a day (BID) | ORAL | Status: DC

## 2014-09-24 ENCOUNTER — Ambulatory Visit: Payer: BC Managed Care – PPO | Admitting: Endocrinology

## 2014-09-27 ENCOUNTER — Telehealth: Payer: Self-pay | Admitting: Endocrinology

## 2014-09-27 ENCOUNTER — Ambulatory Visit (INDEPENDENT_AMBULATORY_CARE_PROVIDER_SITE_OTHER): Payer: BC Managed Care – PPO | Admitting: Endocrinology

## 2014-09-27 ENCOUNTER — Other Ambulatory Visit (INDEPENDENT_AMBULATORY_CARE_PROVIDER_SITE_OTHER): Payer: BC Managed Care – PPO

## 2014-09-27 ENCOUNTER — Encounter: Payer: Self-pay | Admitting: Endocrinology

## 2014-09-27 VITALS — BP 130/70 | HR 90 | Temp 98.6°F | Ht 67.0 in | Wt 208.0 lb

## 2014-09-27 DIAGNOSIS — I10 Essential (primary) hypertension: Secondary | ICD-10-CM

## 2014-09-27 DIAGNOSIS — E7800 Pure hypercholesterolemia, unspecified: Secondary | ICD-10-CM

## 2014-09-27 DIAGNOSIS — E876 Hypokalemia: Secondary | ICD-10-CM

## 2014-09-27 DIAGNOSIS — N951 Menopausal and female climacteric states: Secondary | ICD-10-CM | POA: Insufficient documentation

## 2014-09-27 DIAGNOSIS — M10071 Idiopathic gout, right ankle and foot: Secondary | ICD-10-CM

## 2014-09-27 DIAGNOSIS — R079 Chest pain, unspecified: Secondary | ICD-10-CM

## 2014-09-27 DIAGNOSIS — M109 Gout, unspecified: Secondary | ICD-10-CM

## 2014-09-27 DIAGNOSIS — Z23 Encounter for immunization: Secondary | ICD-10-CM

## 2014-09-27 LAB — BASIC METABOLIC PANEL
BUN: 18 mg/dL (ref 6–23)
CO2: 22 meq/L (ref 19–32)
Calcium: 9.4 mg/dL (ref 8.4–10.5)
Chloride: 106 mEq/L (ref 96–112)
Creatinine, Ser: 1.2 mg/dL (ref 0.4–1.2)
GFR: 61.56 mL/min (ref >60.00)
Glucose, Bld: 91 mg/dL (ref 70–99)
Potassium: 3.5 mEq/L (ref 3.5–5.1)
SODIUM: 139 meq/L (ref 135–145)

## 2014-09-27 LAB — LIPID PANEL
CHOLESTEROL: 245 mg/dL — AB (ref 0–200)
HDL: 43.2 mg/dL (ref >39.00)
LDL Cholesterol: 180 mg/dL — ABNORMAL HIGH (ref 0–99)
NonHDL: 201.8
TRIGLYCERIDES: 108 mg/dL (ref 0.0–149.0)
Total CHOL/HDL Ratio: 6
VLDL: 21.6 mg/dL (ref 0.0–40.0)

## 2014-09-27 LAB — URIC ACID: Uric Acid, Serum: 7.5 mg/dL — ABNORMAL HIGH (ref 2.4–7.0)

## 2014-09-27 MED ORDER — ALLOPURINOL 300 MG PO TABS: 300.0000 mg | ORAL_TABLET | Freq: Every day | ORAL | Status: DC

## 2014-09-27 NOTE — Telephone Encounter (Signed)
Lab contacted. Lab add-on sheet sent.

## 2014-09-27 NOTE — Patient Instructions (Addendum)
blood tests are being requested for you today.  We'll contact you with results. Let's check a "treadmill" test.  you will receive a phone call, about a day and time for an appointment.

## 2014-09-27 NOTE — Progress Notes (Signed)
Subjective:    Patient ID: Jocelyn Ford, female    DOB: Nov 26, 1951, 63 y.o.   MRN: [id number]  HPI Pt states 1 month of intermittent nonexertional slight pain at the left anterior chest, and assoc heartburn.  The pain lasts 1 second at a time.   Past Medical History  Diagnosis Date  . Dizziness and giddiness   . Dysuria   . Acute gouty arthropathy   . Iron deficiency anemia, unspecified   . Pure hypercholesterolemia   . Routine general medical examination at a health care facility   . Nonspecific abnormal electrocardiogram (ECG) (EKG)   . Osteoporosis, unspecified   . Unspecified essential hypertension   . Esophageal reflux   . Allergic rhinitis, cause unspecified   . Arthritis     Past Surgical History  Procedure Laterality Date  . Abdominal hysterectomy      History   Social History  . Marital Status: Divorced    Spouse Name: n/a    Number of Children: 1  . Years of Education: 13   Occupational History  . Magazine features editor   Social History Main Topics  . Smoking status: Never Smoker   . Smokeless tobacco: Never Used  . Alcohol Use: No  . Drug Use: No  . Sexual Activity: No   Other Topics Concern  . Not on file   Social History Narrative   Lives alone.  Her daughter lives nearby.    Current Outpatient Prescriptions on File Prior to Visit  Medication Sig Dispense Refill  . amLODipine (NORVASC) 10 MG tablet TAKE 1 TABLET (10 MG TOTAL) BY MOUTH DAILY.  90 tablet  1  . atenolol (TENORMIN) 25 MG tablet TAKE 1 TABLET (25 MG TOTAL) BY MOUTH 2 (TWO) TIMES DAILY.  180 tablet  1  . colchicine 0.6 MG tablet 1 pill every hour until gout is better, or until diarrhea, maximum of 6 pills per day  12 tablet  1  . furosemide (LASIX) 20 MG tablet TAKE 1 TABLET BY MOUTH EVERY DAY  90 tablet  1  . ipratropium (ATROVENT) 0.03 % nasal spray Place 2 sprays into both nostrils 2 (two) times daily.  30 mL  4  . omeprazole (PRILOSEC) 40 MG capsule TAKE ONE  CAPSULE EVERY DAY  90 capsule  1  . potassium chloride SA (K-DUR,KLOR-CON) 20 MEQ tablet Take 1 tablet (20 mEq total) by mouth 2 (two) times daily.  180 tablet  1   No current facility-administered medications on file prior to visit.    Allergies  Allergen Reactions  . Doxycycline   . Nifedipine     REACTION: Nausea,weakness    Family History  Problem Relation Age of Onset  . Cancer Father     Prostate    BP 130/70  Pulse 90  Temp(Src) 98.6 F (37 C) (Oral)  Ht 5\' 7"  (1.702 m)  Wt 208 lb (94.348 kg)  BMI 32.57 kg/m2  SpO2 97%    Review of Systems  Constitutional: Negative for fever and unexpected weight change.  HENT: Negative for hearing loss.   Eyes: Negative for visual disturbance.  Respiratory: Negative for shortness of breath.   Cardiovascular: Negative for palpitations.  Gastrointestinal: Negative for anal bleeding.  Endocrine: Negative for cold intolerance.  Genitourinary: Negative for hematuria.  Musculoskeletal: Negative for back pain.  Allergic/Immunologic: Positive for environmental allergies.  Neurological: Negative for numbness.  Hematological: Does not bruise/bleed easily.  Psychiatric/Behavioral: Negative for dysphoric mood.  Objective:   Physical Exam VS: see vs page GEN: no distress HEAD: head: no deformity eyes: no periorbital swelling, no proptosis external nose and ears are normal mouth: no lesion seen NECK: supple, thyroid is not enlarged CHEST WALL: no deformity LUNGS:  Clear to auscultation BREASTS: sees gyn CV: reg rate and rhythm, no murmur ABD: abdomen is soft, nontender.  no hepatosplenomegaly.  not distended.  no hernia GENITALIA/RECTAL: sees gyn MUSCULOSKELETAL: muscle bulk and strength are grossly normal.  no obvious joint swelling.  gait is normal and steady EXTEMITIES: no deformity.  no ulcer on the feet.  feet are of normal color and temp.  no edema PULSES: dorsalis pedis intact bilat.  no carotid bruit NEURO:  cn  2-12 grossly intact.   readily moves all 4's.  sensation is intact to touch on the feet SKIN:  Normal texture and temperature.  No rash or suspicious lesion is visible.   NODES:  None palpable at the neck PSYCH: alert, well-oriented.  Does not appear anxious nor depressed.    i reviewed electrocardiogram.   Lab Results  Component Value Date   WBC 4.3 06/26/2014   HGB 12.6 06/26/2014   HCT 37.4 06/26/2014   PLT 384 06/26/2014   GLUCOSE 91 09/27/2014   CHOL 245* 09/27/2014   TRIG 108.0 09/27/2014   HDL 43.20 09/27/2014   LDLDIRECT 194.6 08/21/2013   LDLCALC 180* 09/27/2014   ALT 13 08/21/2013   AST 13 08/21/2013   NA 139 09/27/2014   K 3.5 09/27/2014   CL 106 09/27/2014   CREATININE 1.2 09/27/2014   BUN 18 09/27/2014   CO2 22 09/27/2014   TSH 0.68 08/21/2013   Radiol: Ankle x-ray (03/20/13): no evidence of gouty arthropathy    Assessment & Plan:  Chest pain, new, unlikely to ischemic.  Hyperuricemia, worse.  Dyslipidemia.  She needs increased rx.    Patient is advised the following: Patient Instructions  blood tests are being requested for you today.  We'll contact you with results. Let's check a "treadmill" test.  you will receive a phone call, about a day and time for an appointment.

## 2014-09-27 NOTE — Telephone Encounter (Signed)
please call lab: Please make sure they run all of the labs ordered today

## 2014-09-30 ENCOUNTER — Telehealth: Payer: Self-pay | Admitting: Endocrinology

## 2014-09-30 MED ORDER — ATORVASTATIN CALCIUM 40 MG PO TABS: 40.0000 mg | ORAL_TABLET | Freq: Every day | ORAL | Status: DC

## 2014-09-30 NOTE — Telephone Encounter (Signed)
Ok, i have sent a prescription to your pharmacy  

## 2014-09-30 NOTE — Telephone Encounter (Signed)
See below, Thanks!  

## 2014-09-30 NOTE — Telephone Encounter (Signed)
Pt would like to be rx cholesterol meds please

## 2014-10-10 ENCOUNTER — Telehealth: Payer: Self-pay | Admitting: Endocrinology

## 2014-10-10 NOTE — Telephone Encounter (Signed)
See below and please advise, Thanks!  

## 2014-10-10 NOTE — Telephone Encounter (Signed)
Ok, please try taking 1/2 pill per day

## 2014-10-10 NOTE — Telephone Encounter (Signed)
Lvom advising of below. Requested call back she would like to discuss.

## 2014-10-10 NOTE — Telephone Encounter (Signed)
Pt is stating the 300 mg of the allipurinol is too much for her she is having stomach cramps loose stool

## 2014-10-10 NOTE — Telephone Encounter (Signed)
Please see below.

## 2014-10-12 ENCOUNTER — Other Ambulatory Visit: Payer: Self-pay | Admitting: Endocrinology

## 2014-10-23 ENCOUNTER — Telehealth (HOSPITAL_COMMUNITY): Payer: Self-pay

## 2014-10-23 NOTE — Telephone Encounter (Signed)
Encounter complete. 

## 2014-10-24 ENCOUNTER — Encounter (HOSPITAL_COMMUNITY): Payer: BC Managed Care – PPO

## 2014-11-20 ENCOUNTER — Telehealth (HOSPITAL_COMMUNITY): Payer: Self-pay

## 2014-11-20 NOTE — Telephone Encounter (Signed)
Encounter complete. 

## 2014-11-22 ENCOUNTER — Other Ambulatory Visit: Payer: Self-pay | Admitting: Endocrinology

## 2014-11-22 ENCOUNTER — Ambulatory Visit (HOSPITAL_COMMUNITY)
Admission: RE | Admit: 2014-11-22 | Discharge: 2014-11-22 | Disposition: A | Discharge status: Home / Self Care | Payer: BC Managed Care – PPO | Source: Ambulatory Visit | Attending: Internal Medicine | Admitting: Internal Medicine

## 2014-11-22 DIAGNOSIS — R079 Chest pain, unspecified: Secondary | ICD-10-CM | POA: Diagnosis present

## 2014-11-22 NOTE — Procedures (Signed)
Exercise Treadmill Test   Test  Exercise Tolerance Test Ordering MD: Renato Shin, MD    Unique Test No: 1  Treadmill:  1  Indication for ETT: chest pain - rule out ischemia  Contraindication to ETT: No   Stress Modality: exercise - treadmill  Cardiac Imaging Performed: non   Protocol: standard Bruce - maximal  Max BP:  156/59  Max MPHR (bpm):  159 85% MPR (bpm):  135  MPHR obtained (bpm):  155 % MPHR obtained:  97  Reached 85% MPHR (min:sec):  2:15 Total Exercise Time (min-sec):  5  Workload in METS:  7.0 Borg Scale: 16  Reason ETT Terminated:  Fatigue and SOB    ST Segment Analysis At Rest: normal ST segments - no evidence of significant ST depression With Exercise: significant ischemic ST depression  Other Information Arrhythmia:  No Angina during ETT:  absent (0) Quality of ETT:  diagnostic  ETT Interpretation:  abnormal - evidence of ST depression consistent with ischemia  Comments: 2 mm horizontal ST depression in II, III, AVF with peak exercise Poor exercise tolerance Hypotensive response to exercise, with resting hypertension  Recommendations: Clinical correlation is advised  Pixie Casino, MD, [facility] Attending Cardiologist [place]

## 2014-11-28 ENCOUNTER — Encounter: Payer: Self-pay | Admitting: Endocrinology

## 2014-12-25 ENCOUNTER — Ambulatory Visit: Payer: BC Managed Care – PPO | Admitting: Cardiology

## 2014-12-25 ENCOUNTER — Encounter: Payer: Self-pay | Admitting: Cardiology

## 2014-12-25 ENCOUNTER — Ambulatory Visit (INDEPENDENT_AMBULATORY_CARE_PROVIDER_SITE_OTHER): Payer: BLUE CROSS/BLUE SHIELD | Admitting: Cardiology

## 2014-12-25 VITALS — BP 150/70 | HR 80 | Ht 67.0 in | Wt 213.0 lb

## 2014-12-25 DIAGNOSIS — Z01812 Encounter for preprocedural laboratory examination: Secondary | ICD-10-CM

## 2014-12-25 DIAGNOSIS — E7800 Pure hypercholesterolemia, unspecified: Secondary | ICD-10-CM

## 2014-12-25 DIAGNOSIS — I10 Essential (primary) hypertension: Secondary | ICD-10-CM

## 2014-12-25 DIAGNOSIS — I209 Angina pectoris, unspecified: Secondary | ICD-10-CM

## 2014-12-25 DIAGNOSIS — R9439 Abnormal result of other cardiovascular function study: Secondary | ICD-10-CM

## 2014-12-25 LAB — CBC WITH DIFFERENTIAL/PLATELET
BASOS PCT: 1 % (ref 0.0–3.0)
Basophils Absolute: 0.1 10*3/uL (ref 0.0–0.1)
Eosinophils Absolute: 0.3 10*3/uL (ref 0.0–0.7)
Eosinophils Relative: 5.8 % — ABNORMAL HIGH (ref 0.0–5.0)
HCT: 34.4 % — ABNORMAL LOW (ref 36.0–46.0)
Hemoglobin: 11.1 g/dL — ABNORMAL LOW (ref 12.0–15.0)
LYMPHS ABS: 2.6 10*3/uL (ref 0.7–4.0)
Lymphocytes Relative: 45.2 % (ref 12.0–46.0)
MCHC: 32.4 g/dL (ref 30.0–36.0)
MCV: 82.8 fl (ref 78.0–100.0)
MONO ABS: 0.4 10*3/uL (ref 0.1–1.0)
Monocytes Relative: 7.7 % (ref 3.0–12.0)
NEUTROS ABS: 2.3 10*3/uL (ref 1.4–7.7)
Neutrophils Relative %: 40.3 % — ABNORMAL LOW (ref 43.0–77.0)
Platelets: 387 10*3/uL (ref 150.0–400.0)
RBC: 4.16 Mil/uL (ref 3.87–5.11)
RDW: 14.9 % (ref 11.5–15.5)
WBC: 5.7 10*3/uL (ref 4.0–10.5)

## 2014-12-25 LAB — BASIC METABOLIC PANEL
BUN: 16 mg/dL (ref 6–23)
CO2: 29 meq/L (ref 19–32)
Calcium: 9.2 mg/dL (ref 8.4–10.5)
Chloride: 105 mEq/L (ref 96–112)
Creatinine, Ser: 1.3 mg/dL — ABNORMAL HIGH (ref 0.4–1.2)
GFR: 52.46 mL/min — ABNORMAL LOW (ref >60.00)
Glucose, Bld: 92 mg/dL (ref 70–99)
POTASSIUM: 3.8 meq/L (ref 3.5–5.1)
SODIUM: 140 meq/L (ref 135–145)

## 2014-12-25 MED ORDER — ASPIRIN EC 81 MG PO TBEC: 81.0000 mg | DELAYED_RELEASE_TABLET | Freq: Every day | ORAL | Status: DC

## 2014-12-25 NOTE — Patient Instructions (Addendum)
Your physician has recommended you make the following change in your medication:  1) START Aspirin 81mg  daily  Lab [place]: Bmet, Cbc  Your physician has requested that you have a cardiac catheterization. Cardiac catheterization is used to diagnose and/or treat various heart conditions. Doctors may recommend this procedure for a number of different reasons. The most common reason is to evaluate chest pain. Chest pain can be a symptom of coronary artery disease (CAD), and cardiac catheterization can show whether plaque is narrowing or blocking your heart's arteries. This procedure is also used to evaluate the valves, as well as measure the blood flow and oxygen levels in different parts of your heart. For further information please visit [email or web address]. Please follow instruction sheet, as given.  Your physician recommends that you schedule a follow-up appointment in: 2 weeks

## 2014-12-25 NOTE — Progress Notes (Signed)
Jocelyn Ford. [address] [place], [place]  [address] Phone: [phone] Fax:  [phone]  Date:  12/25/2014   ID:  Jocelyn Ford, DOB 1951/02/27, MRN [id number]  PCP:  Renato Shin, MD   History of Present Illness: Jocelyn Ford is a 64 y.o. female here for evaluation of chest pain with abnormal exercise treadmill test showing 2 mm of horizontal ST depression in inferior leads with peak exercise. Poor exercise tolerance 2:15. Hypotensive response to exercise. Exercise treadmill was on 11/22/14.  Tired, felt dull pain in middle of chest periodically. Has had none since treadmill. Winded. No radiation.   No tob, Father died 25. No DM. Positive hyperlipidemia with LDL of 180. Not currently on statin therapy.    Wt Readings from Last 3 Encounters:  12/25/14 213 lb (96.616 kg)  09/27/14 208 lb (94.348 kg)  08/07/14 209 lb (94.802 kg)     Past Medical History  Diagnosis Date  . Dizziness and giddiness   . Dysuria   . Acute gouty arthropathy   . Iron deficiency anemia, unspecified   . Pure hypercholesterolemia   . Routine general medical examination at a health care facility   . Nonspecific abnormal electrocardiogram (ECG) (EKG)   . Osteoporosis, unspecified   . Unspecified essential hypertension   . Esophageal reflux   . Allergic rhinitis, cause unspecified   . Arthritis     Past Surgical History  Procedure Laterality Date  . Abdominal hysterectomy      Current Outpatient Prescriptions  Medication Sig Dispense Refill  . allopurinol (ZYLOPRIM) 300 MG tablet Take 1 tablet (300 mg total) by mouth daily. 90 tablet 3  . amLODipine (NORVASC) 10 MG tablet TAKE 1 TABLET (10 MG TOTAL) BY MOUTH DAILY. 90 tablet 1  . atenolol (TENORMIN) 25 MG tablet TAKE 1 TABLET (25 MG TOTAL) BY MOUTH 2 (TWO) TIMES DAILY. 180 tablet 1  . furosemide (LASIX) 20 MG tablet TAKE 1 TABLET BY MOUTH EVERY DAY 90 tablet 1  . ipratropium (ATROVENT) 0.03 % nasal spray Place 2 sprays into  both nostrils 2 (two) times daily. 30 mL 4  . Multiple Vitamin (MULTIVITAMIN) capsule Take 2 capsules by mouth daily.    Marland Kitchen omeprazole (PRILOSEC) 40 MG capsule TAKE ONE CAPSULE EVERY DAY 90 capsule 1  . omeprazole (PRILOSEC) 40 MG capsule TAKE ONE CAPSULE EVERY DAY 30 capsule 6  . potassium chloride SA (K-DUR,KLOR-CON) 20 MEQ tablet Take 1 tablet (20 mEq total) by mouth 2 (two) times daily. 180 tablet 1   No current facility-administered medications for this visit.    Allergies:    Allergies  Allergen Reactions  . Doxycycline   . Nifedipine     REACTION: Nausea,weakness    Social History:  The patient  reports that she has never smoked. She has never used smokeless tobacco. She reports that she does not drink alcohol or use illicit drugs.   Family History  Problem Relation Age of Onset  . Cancer Father     Prostate    ROS:  Please see the history of present illness.   Denies fevers, chills, orthopnea, PND, syncope, bleeding, rashes strokelike symptoms. She did state that during colonoscopy she had "trouble waking up ".   All other systems reviewed and negative.   PHYSICAL EXAM: VS:  BP 150/70 mmHg  Pulse 80  Ht 5\' 7"  (1.702 m)  Wt 213 lb (96.616 kg)  BMI 33.35 kg/m2 Well nourished, well developed, in no  acute distress HEENT: normal, Rhea/AT, EOMI Neck: no JVD, normal carotid upstroke, no bruit Cardiac:  normal S1, S2; RRR; no murmur Lungs:  clear to auscultation bilaterally, no wheezing, rhonchi or rales Abd: soft, nontender, no hepatomegaly, no bruits Ext: no edema, 2+ distal pulses Skin: warm and dry GU: deferred Neuro: no focal abnormalities noted, AAO x 3  EKG:  09/27/14 - normal, no ST changes. Exercise treadmill test: Abnormal 11/22/14 as above.      ASSESSMENT AND PLAN:  1. Abnormal exercise treadmill test/angina-we will go ahead and proceed with cardiac catheterization, radial artery approach, risks and benefits including stroke, heart attack, death, renal  impairment, bleeding have been discussed. All questions answered. She is willing to proceed. We will use light sedation at first, 1 mg Versed, 25 g of fentanyl because of her prior experience with colonoscopy "having trouble waking up ". I'm unsure if they were using propofol for that procedure. I'm starting aspirin 81 mg. Avoid any heavy exercise currently. She works as a Glass blower/designer, on her feet all day. She will need to likely be out of work for approximate 5 days after catheterization. Given stress test, possibility of triple-vessel coronary artery disease and need for bypass surgery. 2. Hypertension-multidrug regimen. Continue current medications. Mildly elevated today. We will continue to monitor and add his medication as needed. 3. Hyperlipidemia-LDL was 180 on 09/27/14. Diet, exercise. She will likely need statin therapy given her abnormal exercise trouble test. Of course, if there is significant plaque burden within coronary arteries, we will start statin immediately. 4. I will see her back 2 weeks post catheterization.  Cath Lab Visit (complete for each Cath Lab visit)  Clinical Evaluation Leading to the Procedure:   ACS: No.  Non-ACS:    Anginal Classification: CCS II  Anti-ischemic medical therapy: Minimal Therapy (1 class of medications)  Non-Invasive Test Results: High-risk stress test findings: cardiac mortality >3%/year  Prior CABG: No previous CABG   Signed, Candee Furbish, MD [facility]  12/25/2014 3:05 PM

## 2014-12-30 ENCOUNTER — Telehealth: Payer: Self-pay | Admitting: Cardiology

## 2014-12-30 ENCOUNTER — Ambulatory Visit (INDEPENDENT_AMBULATORY_CARE_PROVIDER_SITE_OTHER): Payer: BLUE CROSS/BLUE SHIELD | Admitting: Internal Medicine

## 2014-12-30 VITALS — BP 148/86 | HR 103 | Temp 99.0°F | Resp 18 | Ht 67.0 in | Wt 206.0 lb

## 2014-12-30 DIAGNOSIS — R112 Nausea with vomiting, unspecified: Secondary | ICD-10-CM

## 2014-12-30 MED ORDER — ONDANSETRON 4 MG PO TBDP
8.0000 mg | ORAL_TABLET | Freq: Once | ORAL | Status: AC
  Administered 2014-12-30: 8 mg via ORAL

## 2014-12-30 NOTE — Telephone Encounter (Signed)
New problem   Pt is sched for Cath on 1.13.16 and pt states she has stomach flu and want to r/s. Please call pt to r/s cath.

## 2014-12-30 NOTE — Progress Notes (Signed)
Subjective:  This chart was scribed for Jocelyn Koyanagi, MD by Ladene Artist, ED Scribe. The patient was seen in room 1. Patient's care was started at 10:54 AM.   Patient ID: Jocelyn Ford, female    DOB: January 11, 1951, 64 y.o.   MRN: [id number]  Chief Complaint  Patient presents with  . Emesis    since saturday   . Diarrhea  . Nausea   HPI HPI Comments: Jocelyn Ford is a 64 y.o. female, with a h/o anemia, hypercholesterolemia, osteoporosis, HTN, who presents to the [facility] complaining of persistent nausea over the past 2 days. She reports associated LUQ abdominal pain that she describes as a cramping sensation, emesis (last episode yesterday), and diarrhea (last episode last night). Pt denies fever, cough, any urinary symptoms. She reports a house full of sick contacts with similar symptoms.   Past Medical History  Diagnosis Date  . Dizziness and giddiness   . Dysuria   . Acute gouty arthropathy   . Iron deficiency anemia, unspecified   . Pure hypercholesterolemia   . Routine general medical examination at a health care facility   . Nonspecific abnormal electrocardiogram (ECG) (EKG)   . Osteoporosis, unspecified   . Unspecified essential hypertension   . Esophageal reflux   . Allergic rhinitis, cause unspecified   . Arthritis    Current Outpatient Prescriptions on File Prior to Visit  Medication Sig Dispense Refill  . allopurinol (ZYLOPRIM) 300 MG tablet Take 1 tablet (300 mg total) by mouth daily. 90 tablet 3  . amLODipine (NORVASC) 10 MG tablet TAKE 1 TABLET (10 MG TOTAL) BY MOUTH DAILY. 90 tablet 1  . aspirin EC 81 MG tablet Take 1 tablet (81 mg total) by mouth daily. 90 tablet 3  . atenolol (TENORMIN) 25 MG tablet TAKE 1 TABLET (25 MG TOTAL) BY MOUTH 2 (TWO) TIMES DAILY. 180 tablet 1  . furosemide (LASIX) 20 MG tablet TAKE 1 TABLET BY MOUTH EVERY DAY 90 tablet 1  . Multiple Vitamin (MULTIVITAMIN) capsule Take 2 capsules by mouth daily.      Marland Kitchen omeprazole (PRILOSEC) 40 MG capsule TAKE ONE CAPSULE EVERY DAY 90 capsule 1  . potassium chloride SA (K-DUR,KLOR-CON) 20 MEQ tablet Take 1 tablet (20 mEq total) by mouth 2 (two) times daily. 180 tablet 1  . ipratropium (ATROVENT) 0.03 % nasal spray Place 2 sprays into both nostrils 2 (two) times daily. (Patient not taking: Reported on 12/30/2014) 30 mL 4   No current facility-administered medications on file prior to visit.   Allergies  Allergen Reactions  . Doxycycline   . Nifedipine     REACTION: Nausea,weakness   Review of Systems  Constitutional: Negative for fever.  Respiratory: Negative for cough.   Gastrointestinal: Positive for nausea, vomiting, abdominal pain and diarrhea.  Genitourinary: Negative.       Objective:   Physical Exam  Constitutional: She is oriented to person, place, and time. She appears well-developed and well-nourished. No distress.  HENT:  Head: Normocephalic and atraumatic.  Eyes: Conjunctivae and EOM are normal.  Neck: Neck supple.  Cardiovascular: Normal rate.   Pulmonary/Chest: Effort normal.  Abdominal: She exhibits no mass. Bowel sounds are increased. There is no rebound and no guarding.  Nontender to palpation.   Musculoskeletal: Normal range of motion.  Neurological: She is alert and oriented to person, place, and time.  Skin: Skin is warm and dry.  Psychiatric: She has a normal mood and affect. Her behavior is  normal.  Nursing note and vitals reviewed. BP 148/86 mmHg  Pulse 103  Temp(Src) 99 F (37.2 C) (Oral)  Resp 18  Ht 5\' 7"  (1.702 m)  Wt 206 lb (93.441 kg)  BMI 32.26 kg/m2  SpO2 98%    Assessment & Plan:   I have completed the patient encounter in its entirety as documented by the scribe, with editing by me where necessary. Pavielle Biggar P. Laney Pastor, M.D.  Viral GE  zofr 8 x 1 then pofluids and adv as tol immod if needed

## 2014-12-30 NOTE — Telephone Encounter (Signed)
Patient is unable to make scheduled Cath on 01/01/15 due to the stomach flu. She would like to reschedule for a Thursday or Friday, possibly next week. Will forward to Dr. Marlou Porch for further instructions.

## 2015-01-01 ENCOUNTER — Encounter (HOSPITAL_COMMUNITY): Admission: RE | Payer: Self-pay | Source: Ambulatory Visit

## 2015-01-01 ENCOUNTER — Ambulatory Visit (HOSPITAL_COMMUNITY): Admission: RE | Admit: 2015-01-01 | Payer: BLUE CROSS/BLUE SHIELD | Source: Ambulatory Visit | Admitting: Cardiology

## 2015-01-01 SURGERY — LEFT HEART CATHETERIZATION WITH CORONARY ANGIOGRAM
Anesthesia: LOCAL

## 2015-01-01 NOTE — Telephone Encounter (Signed)
Left message for pt to call back to reschedule.  Dr Marlou Porch is in the hospital Wednesday but not Thursday or Friday.  If she needs Thursday or Friday someone else will have to do it.

## 2015-01-01 NOTE — Telephone Encounter (Signed)
Ok. Reschedule. Wed 20th ok  Candee Furbish, MD

## 2015-01-03 ENCOUNTER — Telehealth: Payer: Self-pay | Admitting: Cardiology

## 2015-01-03 NOTE — Telephone Encounter (Signed)
Dr Marlou Porch aware I have been unable to reach pt by phone and she has not returned my call.  Will await response from pt.

## 2015-01-03 NOTE — Telephone Encounter (Signed)
Left another message for pt to call back to reschedule cath.

## 2015-01-03 NOTE — Telephone Encounter (Signed)
Per pt - she wants to reschedule cardiac cath but still feels poorly.  She will call back once she is feeling better.

## 2015-01-03 NOTE — Telephone Encounter (Signed)
New problem   Pt returning your calling concerning scheduling a procedure. Please call pt.

## 2015-01-06 ENCOUNTER — Telehealth: Payer: Self-pay | Admitting: Endocrinology

## 2015-01-06 MED ORDER — FUROSEMIDE 20 MG PO TABS: ORAL_TABLET | ORAL | Status: DC

## 2015-01-06 NOTE — Telephone Encounter (Signed)
Rx sent to pharmacy   

## 2015-01-06 NOTE — Telephone Encounter (Signed)
Patient states she would like to have her furosemide 20 mg sent to [facility] 3 day supply as she is waiting on [facility]    Thank you

## 2015-01-15 ENCOUNTER — Telehealth: Payer: Self-pay | Admitting: Cardiology

## 2015-01-15 DIAGNOSIS — Z01810 Encounter for preprocedural cardiovascular examination: Secondary | ICD-10-CM

## 2015-01-15 DIAGNOSIS — R9431 Abnormal electrocardiogram [ECG] [EKG]: Secondary | ICD-10-CM

## 2015-01-15 NOTE — Telephone Encounter (Signed)
Pt would like to reschedule her cardiac cath.  She had been previously scheduled however developed a GI bug and was unable to have the procedure done.  She is now feeling better and would like to schedule.  I will forward to Dr Gillian Shields to find how when he would like her to have it done.

## 2015-01-15 NOTE — Telephone Encounter (Signed)
New problem   Pt is ready to re schedule her Cath. Please call pt.

## 2015-01-16 ENCOUNTER — Encounter: Payer: Self-pay | Admitting: *Deleted

## 2015-01-16 NOTE — Telephone Encounter (Signed)
Follow Up ° ° ° ° ° ° ° °Pt returning phone call °

## 2015-01-16 NOTE — Telephone Encounter (Signed)
Pt aware and has been scheduled for 01/28/2015 at 10 am.  She will need to arrive at 8 am.  Letter of instructions will be placed at the front desk to be picked up when she comes for blood work.

## 2015-01-16 NOTE — Telephone Encounter (Signed)
Feb 9, Tues OK Candee Furbish, MD

## 2015-01-16 NOTE — Telephone Encounter (Signed)
Left message for pt to call back to schedule cath. (2/9 with [facility])

## 2015-01-21 ENCOUNTER — Ambulatory Visit: Payer: BLUE CROSS/BLUE SHIELD | Admitting: Cardiology

## 2015-01-22 ENCOUNTER — Other Ambulatory Visit (INDEPENDENT_AMBULATORY_CARE_PROVIDER_SITE_OTHER): Payer: BLUE CROSS/BLUE SHIELD | Admitting: *Deleted

## 2015-01-22 DIAGNOSIS — R9431 Abnormal electrocardiogram [ECG] [EKG]: Secondary | ICD-10-CM

## 2015-01-22 DIAGNOSIS — Z01810 Encounter for preprocedural cardiovascular examination: Secondary | ICD-10-CM

## 2015-01-22 NOTE — Addendum Note (Signed)
Addended by: Eulis Foster on: 01/22/2015 04:24 PM   Modules accepted: Orders

## 2015-01-23 LAB — PROTIME-INR
INR: 1.1 ratio — ABNORMAL HIGH (ref 0.8–1.0)
Prothrombin Time: 11.8 s (ref 9.6–13.1)

## 2015-01-23 LAB — BASIC METABOLIC PANEL
BUN: 19 mg/dL (ref 6–23)
CALCIUM: 9.3 mg/dL (ref 8.4–10.5)
CHLORIDE: 105 meq/L (ref 96–112)
CO2: 27 meq/L (ref 19–32)
Creatinine, Ser: 1.28 mg/dL — ABNORMAL HIGH (ref 0.40–1.20)
GFR: 54.35 mL/min — ABNORMAL LOW (ref >60.00)
Glucose, Bld: 68 mg/dL — ABNORMAL LOW (ref 70–99)
Potassium: 3.5 mEq/L (ref 3.5–5.1)
Sodium: 142 mEq/L (ref 135–145)

## 2015-01-23 LAB — CBC
HCT: 31.9 % — ABNORMAL LOW (ref 36.0–46.0)
Hemoglobin: 10.4 g/dL — ABNORMAL LOW (ref 12.0–15.0)
MCHC: 32.8 g/dL (ref 30.0–36.0)
MCV: 82.3 fl (ref 78.0–100.0)
Platelets: 380 10*3/uL (ref 150.0–400.0)
RBC: 3.87 Mil/uL (ref 3.87–5.11)
RDW: 14.4 % (ref 11.5–15.5)
WBC: 5.5 10*3/uL (ref 4.0–10.5)

## 2015-01-24 ENCOUNTER — Telehealth: Payer: Self-pay | Admitting: Cardiology

## 2015-01-24 NOTE — Telephone Encounter (Signed)
Left message to call back to discuss.

## 2015-01-24 NOTE — Telephone Encounter (Signed)
New Message  Pt called to discuss CATH directions. Please call

## 2015-01-28 ENCOUNTER — Ambulatory Visit (HOSPITAL_COMMUNITY)
Admission: RE | Admit: 2015-01-28 | Discharge: 2015-01-28 | Disposition: A | Discharge status: Home / Self Care | Payer: BLUE CROSS/BLUE SHIELD | Source: Ambulatory Visit | Attending: Cardiology | Admitting: Cardiology

## 2015-01-28 ENCOUNTER — Encounter (HOSPITAL_COMMUNITY)
Admission: RE | Disposition: A | Discharge status: Home / Self Care | Payer: Self-pay | Source: Ambulatory Visit | Attending: Cardiology

## 2015-01-28 ENCOUNTER — Encounter (HOSPITAL_COMMUNITY): Payer: Self-pay | Admitting: Cardiology

## 2015-01-28 DIAGNOSIS — R9439 Abnormal result of other cardiovascular function study: Secondary | ICD-10-CM | POA: Insufficient documentation

## 2015-01-28 DIAGNOSIS — Z7982 Long term (current) use of aspirin: Secondary | ICD-10-CM | POA: Diagnosis not present

## 2015-01-28 DIAGNOSIS — Z01812 Encounter for preprocedural laboratory examination: Secondary | ICD-10-CM

## 2015-01-28 DIAGNOSIS — E7800 Pure hypercholesterolemia, unspecified: Secondary | ICD-10-CM | POA: Diagnosis present

## 2015-01-28 DIAGNOSIS — M810 Age-related osteoporosis without current pathological fracture: Secondary | ICD-10-CM | POA: Insufficient documentation

## 2015-01-28 DIAGNOSIS — R079 Chest pain, unspecified: Secondary | ICD-10-CM | POA: Diagnosis present

## 2015-01-28 DIAGNOSIS — R0789 Other chest pain: Secondary | ICD-10-CM | POA: Diagnosis not present

## 2015-01-28 DIAGNOSIS — K219 Gastro-esophageal reflux disease without esophagitis: Secondary | ICD-10-CM | POA: Diagnosis not present

## 2015-01-28 DIAGNOSIS — I10 Essential (primary) hypertension: Secondary | ICD-10-CM | POA: Insufficient documentation

## 2015-01-28 HISTORY — PX: LEFT HEART CATHETERIZATION WITH CORONARY ANGIOGRAM: SHX5451

## 2015-01-28 SURGERY — LEFT HEART CATHETERIZATION WITH CORONARY ANGIOGRAM
Anesthesia: LOCAL

## 2015-01-28 MED ORDER — MIDAZOLAM HCL 2 MG/2ML IJ SOLN
INTRAMUSCULAR | Status: AC
  Filled 2015-01-28: qty 2

## 2015-01-28 MED ORDER — SODIUM CHLORIDE 0.9 % IJ SOLN: 3.0000 mL | INTRAMUSCULAR | Status: DC | PRN

## 2015-01-28 MED ORDER — SODIUM CHLORIDE 0.9 % IV SOLN: 250.0000 mL | INTRAVENOUS | Status: DC | PRN

## 2015-01-28 MED ORDER — NITROGLYCERIN 1 MG/10 ML FOR IR/CATH LAB
INTRA_ARTERIAL | Status: AC
  Filled 2015-01-28: qty 10

## 2015-01-28 MED ORDER — ASPIRIN 81 MG PO CHEW
CHEWABLE_TABLET | ORAL | Status: AC
  Administered 2015-01-28: 81 mg
  Filled 2015-01-28: qty 1

## 2015-01-28 MED ORDER — SODIUM CHLORIDE 0.9 % IJ SOLN: 3.0000 mL | Freq: Two times a day (BID) | INTRAMUSCULAR | Status: DC

## 2015-01-28 MED ORDER — FENTANYL CITRATE 0.05 MG/ML IJ SOLN
INTRAMUSCULAR | Status: AC
  Filled 2015-01-28: qty 2

## 2015-01-28 MED ORDER — HEPARIN (PORCINE) IN NACL 2-0.9 UNIT/ML-% IJ SOLN
INTRAMUSCULAR | Status: AC
  Filled 2015-01-28: qty 1500

## 2015-01-28 MED ORDER — VERAPAMIL HCL 2.5 MG/ML IV SOLN
INTRAVENOUS | Status: AC
  Filled 2015-01-28: qty 2

## 2015-01-28 MED ORDER — ASPIRIN 81 MG PO CHEW: 81.0000 mg | CHEWABLE_TABLET | ORAL | Status: DC

## 2015-01-28 MED ORDER — SODIUM CHLORIDE 0.9 % IV SOLN: 1.0000 mL/kg/h | INTRAVENOUS | Status: DC

## 2015-01-28 MED ORDER — SODIUM CHLORIDE 0.9 % IV SOLN
INTRAVENOUS | Status: DC
  Administered 2015-01-28: 08:00:00 via INTRAVENOUS

## 2015-01-28 MED ORDER — LIDOCAINE HCL (PF) 1 % IJ SOLN
INTRAMUSCULAR | Status: AC
  Filled 2015-01-28: qty 30

## 2015-01-28 MED ORDER — HYDRALAZINE HCL 20 MG/ML IJ SOLN
INTRAMUSCULAR | Status: AC
  Filled 2015-01-28: qty 1

## 2015-01-28 NOTE — Progress Notes (Signed)
TR BAND REMOVAL  LOCATION:    right radial  DEFLATED PER PROTOCOL:    Yes.    TIME BAND OFF / DRESSING APPLIED:    1213   SITE UPON ARRIVAL:    Level 0  SITE AFTER BAND REMOVAL:    Level 0  CIRCULATION SENSATION AND MOVEMENT:    Within Normal Limits   Yes.    COMMENTS:   TRB REMOVED/ TEGADERM DSG APPLIED.

## 2015-01-28 NOTE — Interval H&P Note (Signed)
History and Physical Interval Note:  1. Abnormal exercise treadmill test/angina (2 mm ST segment depression with hypotensive response to exercise)-we will go ahead and proceed with cardiac catheterization, radial artery approach, risks and benefits including stroke, heart attack, death, renal impairment, bleeding have been discussed. All questions answered. She is willing to proceed. We will use light sedation at first, 1 mg Versed, 25 g of fentanyl because of her prior experience with colonoscopy "having trouble waking up ". I'm unsure if they were using propofol for that procedure. I'm starting aspirin 81 mg. Avoid any heavy exercise currently. She works as a Glass blower/designer, on her feet all day. She will need to likely be out of work for approximate 5 days after catheterization. Given stress test, possibility of triple-vessel coronary artery disease and need for bypass surgery. 2. Hypertension-multidrug regimen. Continue current medications. Mildly elevated today. We will continue to monitor and add his medication as needed. 3. Hyperlipidemia-LDL was 180 on 09/27/14. Diet, exercise. She will likely need statin therapy given her abnormal exercise trouble test. Of course, if there is significant plaque burden within coronary arteries, we will start statin immediately. 4. I will see her back 2 weeks post catheterization.  Cath Lab Visit (complete for each Cath Lab visit)  Clinical Evaluation Leading to the Procedure:   ACS: No.  Non-ACS:   Anginal Classification: CCS II  Anti-ischemic medical therapy: Minimal Therapy (1 class of medications)  Non-Invasive Test Results: High-risk stress test findings: cardiac mortality >3%/year  Prior CABG: No previous CABG   01/28/2015 8:23 AM  Jocelyn Ford  has presented today for surgery, with the diagnosis of abnormal stress test  The various methods of treatment have been discussed with the patient and family. After consideration of risks,  benefits and other options for treatment, the patient has consented to  Procedure(s): LEFT HEART CATHETERIZATION WITH CORONARY ANGIOGRAM (N/A) as a surgical intervention .  The patient's history has been reviewed, patient examined, no change in status, stable for surgery.  I have reviewed the patient's chart and labs.  Questions were answered to the patient's satisfaction.     SKAINS, MARK

## 2015-01-28 NOTE — Telephone Encounter (Signed)
Pt at the cath lab today for procedure.  She never called back to discuss her concerns.  She will be followed up as scheduled.

## 2015-01-28 NOTE — Discharge Instructions (Signed)
Radial Site Care °Refer to this sheet in the next few weeks. These instructions provide you with information on caring for yourself after your procedure. Your caregiver may also give you more specific instructions. Your treatment has been planned according to current medical practices, but problems sometimes occur. Call your caregiver if you have any problems or questions after your procedure. °HOME CARE INSTRUCTIONS °· You may shower the day after the procedure. Remove the bandage (dressing) and gently wash the site with plain soap and water. Gently pat the site dry. °· Do not apply powder or lotion to the site. °· Do not submerge the affected site in water for 3 to 5 days. °· Inspect the site at least twice daily. °· Do not flex or bend the affected arm for 24 hours. °· No lifting over 5 pounds (2.3 kg) for 5 days after your procedure. °· Do not drive home if you are discharged the same day of the procedure. Have someone else drive you. °· You may drive 24 hours after the procedure unless otherwise instructed by your caregiver. °· Do not operate machinery or power tools for 24 hours. °· A responsible adult should be with you for the first 24 hours after you arrive home. °What to expect: °· Any bruising will usually fade within 1 to 2 weeks. °· Blood that collects in the tissue (hematoma) may be painful to the touch. It should usually decrease in size and tenderness within 1 to 2 weeks. °SEEK IMMEDIATE MEDICAL CARE IF: °· You have unusual pain at the radial site. °· You have redness, warmth, swelling, or pain at the radial site. °· You have drainage (other than a small amount of blood on the dressing). °· You have chills. °· You have a fever or persistent symptoms for more than 72 hours. °· You have a fever and your symptoms suddenly get worse. °· Your arm becomes pale, cool, tingly, or numb. °· You have heavy bleeding from the site. Hold pressure on the site. °Document Released: 01/08/2011 Document Revised:  02/28/2012 Document Reviewed: 01/08/2011 °ExitCare® Patient Information ©2015 [employer]. This information is not intended to replace advice given to you by your health care provider. Make sure you discuss any questions you have with your health care provider. ° °

## 2015-01-28 NOTE — H&P (View-Only) (Signed)
Subjective:  This chart was scribed for Jocelyn Koyanagi, MD by Jocelyn Ford, ED Scribe. The patient was seen in room 1. Patient's care was started at 10:54 AM.   Patient ID: Jocelyn Ford, female    DOB: 01-16-1951, 64 y.o.   MRN: [id number]  Chief Complaint  Patient presents with  . Emesis    since saturday   . Diarrhea  . Nausea   HPI HPI Comments: Jocelyn Ford is a 64 y.o. female, with a h/o anemia, hypercholesterolemia, osteoporosis, HTN, who presents to the [facility] complaining of persistent nausea over the past 2 days. She reports associated LUQ abdominal pain that she describes as a cramping sensation, emesis (last episode yesterday), and diarrhea (last episode last night). Pt denies fever, cough, any urinary symptoms. She reports a house full of sick contacts with similar symptoms.   Past Medical History  Diagnosis Date  . Dizziness and giddiness   . Dysuria   . Acute gouty arthropathy   . Iron deficiency anemia, unspecified   . Pure hypercholesterolemia   . Routine general medical examination at a health care facility   . Nonspecific abnormal electrocardiogram (ECG) (EKG)   . Osteoporosis, unspecified   . Unspecified essential hypertension   . Esophageal reflux   . Allergic rhinitis, cause unspecified   . Arthritis    Current Outpatient Prescriptions on File Prior to Visit  Medication Sig Dispense Refill  . allopurinol (ZYLOPRIM) 300 MG tablet Take 1 tablet (300 mg total) by mouth daily. 90 tablet 3  . amLODipine (NORVASC) 10 MG tablet TAKE 1 TABLET (10 MG TOTAL) BY MOUTH DAILY. 90 tablet 1  . aspirin EC 81 MG tablet Take 1 tablet (81 mg total) by mouth daily. 90 tablet 3  . atenolol (TENORMIN) 25 MG tablet TAKE 1 TABLET (25 MG TOTAL) BY MOUTH 2 (TWO) TIMES DAILY. 180 tablet 1  . furosemide (LASIX) 20 MG tablet TAKE 1 TABLET BY MOUTH EVERY DAY 90 tablet 1  . Multiple Vitamin (MULTIVITAMIN) capsule Take 2 capsules by mouth daily.      Marland Kitchen omeprazole (PRILOSEC) 40 MG capsule TAKE ONE CAPSULE EVERY DAY 90 capsule 1  . potassium chloride SA (K-DUR,KLOR-CON) 20 MEQ tablet Take 1 tablet (20 mEq total) by mouth 2 (two) times daily. 180 tablet 1  . ipratropium (ATROVENT) 0.03 % nasal spray Place 2 sprays into both nostrils 2 (two) times daily. (Patient not taking: Reported on 12/30/2014) 30 mL 4   No current facility-administered medications on file prior to visit.   Allergies  Allergen Reactions  . Doxycycline   . Nifedipine     REACTION: Nausea,weakness   Review of Systems  Constitutional: Negative for fever.  Respiratory: Negative for cough.   Gastrointestinal: Positive for nausea, vomiting, abdominal pain and diarrhea.  Genitourinary: Negative.       Objective:   Physical Exam  Constitutional: She is oriented to person, place, and time. She appears well-developed and well-nourished. No distress.  HENT:  Head: Normocephalic and atraumatic.  Eyes: Conjunctivae and EOM are normal.  Neck: Neck supple.  Cardiovascular: Normal rate.   Pulmonary/Chest: Effort normal.  Abdominal: She exhibits no mass. Bowel sounds are increased. There is no rebound and no guarding.  Nontender to palpation.   Musculoskeletal: Normal range of motion.  Neurological: She is alert and oriented to person, place, and time.  Skin: Skin is warm and dry.  Psychiatric: She has a normal mood and affect. Her behavior is  normal.  Nursing note and vitals reviewed. BP 148/86 mmHg  Pulse 103  Temp(Src) 99 F (37.2 C) (Oral)  Resp 18  Ht 5\' 7"  (1.702 m)  Wt 206 lb (93.441 kg)  BMI 32.26 kg/m2  SpO2 98%    Assessment & Plan:   I have completed the patient encounter in its entirety as documented by the scribe, with editing by me where necessary. Jocelyn Ford, M.D.  Viral GE  zofr 8 x 1 then pofluids and adv as tol immod if needed

## 2015-01-28 NOTE — CV Procedure (Signed)
    CARDIAC CATHETERIZATION  PROCEDURE:  Left heart catheterization with selective coronary angiography, left ventriculogram via the radial artery approach.  INDICATIONS:  65 year old female with chest discomfort and abnormal exercise trouble test with 2 mm of ST segment depression diffusely as well as hypotensive response to exercise. Cardiac catheterization is being performed to exclude triple-vessel coronary artery disease.  The risks, benefits, and details of the procedure were explained to the patient, including possibilities of stroke, heart attack, death, renal impairment, arterial damage, bleeding.  The patient verbalized understanding and wanted to proceed.  Informed written consent was obtained.  PROCEDURE TECHNIQUE:   The right radial artery site was prepped and draped in a sterile fashion. One percent lidocaine was used for local anesthesia. Using the modified Seldinger technique a 5 French hydrophilic sheath was inserted into the radial artery without difficulty. 3 mg of verapamil was administered via the sheath. A Judkins right #4 catheter with the guidance of a Versicore wire was placed in the right coronary cusp and selectively cannulated the right coronary artery. Prior to insertion into coronary cusp, there was resistance in the antecubital fossa region. Radial artery arteriography demonstrated a radial artery loop which was successfully traversed with Versicore wire. After getting into brachial artery, arteriogram was once again performed to demonstrate proper catheter placement. After traversing the aortic arch, 5000 units of heparin IV was administered. A Judkins left #3.5 catheter was used to selectively cannulate the left main artery. Multiple views with hand injection of Omnipaque were obtained. Catheter a pigtail catheter was used to cross into the left ventricle, hemodynamics were obtained, and a left ventriculogram was performed in the RAO position with power injection. Following  the procedure, sheath was removed, patient was hemodynamically stable, hemostasis was maintained with a Terumo T band.   CONTRAST:  Total of 110 ml.    FLOUROSCOPY TIME: 6.5 min.  COMPLICATIONS:  None.    HEMODYNAMICS:  Aortic pressure was 093/81WEXH; LV systolic pressure was 371IRCV; LVEDP 69mmHg.  There was no gradient between the left ventricle and aorta.    ANGIOGRAPHIC DATA:    Left main: Branches into LAD and circumflex, no angiographically significant disease.  Left anterior descending (LAD): No angiographic significant disease. Originally, there was a linear opacity within the proximal LAD but upon further injections streaming artifact. There was no evidence of dissection or abnormality upon further angiograms. One small diagonal branch noted.  Circumflex artery: No angiographic recent coronary artery disease. One significant obtuse marginal branch noted.  Right coronary artery (RCA): no angiographically significant coronary artery disease. This is the dominant vessel giving rise to the posterior descending artery.   LEFT VENTRICULOGRAM:  Left ventricular angiogram was done in the 30 RAO projection and revealed normal left ventricular wall motion and systolic function with an estimated ejection fraction of 65%.   IMPRESSIONS:  No angiographically significant CAD Normal left ventricular systolic function.  LVEDP 22 mmHg.  Ejection fraction 65%.  RECOMMENDATION:   Hypertension was noted through case. Continue to adequately control hypertension.

## 2015-01-29 ENCOUNTER — Encounter: Payer: Self-pay | Admitting: Cardiology

## 2015-01-31 ENCOUNTER — Other Ambulatory Visit: Payer: Self-pay | Admitting: Endocrinology

## 2015-02-12 ENCOUNTER — Telehealth: Payer: Self-pay | Admitting: Cardiology

## 2015-02-12 NOTE — Telephone Encounter (Signed)
Please have her come in so that we can examine her wrist. Candee Furbish, MD

## 2015-02-12 NOTE — Telephone Encounter (Signed)
Per pt call states she has a lump on her wrist about the size of a dime.  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  Both her hand and wrist is swollen.  She denies any redness or drainage.  Aware I will forward this information to Dr Marlou Porch for his review and any orders.

## 2015-02-12 NOTE — Telephone Encounter (Signed)
Left message to call back to discuss.

## 2015-02-12 NOTE — Telephone Encounter (Signed)
New message      Pt had a cath on 01-28-15.  The area on her wrist has a lump and it is still swollen.  Is this normal?  No pain in the area.

## 2015-02-19 NOTE — Telephone Encounter (Signed)
Follow up    Patient calling back stating her arm is doing fine.

## 2015-02-19 NOTE — Telephone Encounter (Signed)
noted 

## 2015-02-19 NOTE — Telephone Encounter (Signed)
Left message for pt to call back  °

## 2015-02-24 ENCOUNTER — Other Ambulatory Visit: Payer: Self-pay | Admitting: Endocrinology

## 2015-02-27 ENCOUNTER — Other Ambulatory Visit: Payer: Self-pay | Admitting: Endocrinology

## 2015-02-28 ENCOUNTER — Other Ambulatory Visit: Payer: Self-pay | Admitting: *Deleted

## 2015-02-28 MED ORDER — OMEPRAZOLE 40 MG PO CPDR: DELAYED_RELEASE_CAPSULE | ORAL | Status: DC

## 2015-02-28 MED ORDER — FUROSEMIDE 20 MG PO TABS: 20.0000 mg | ORAL_TABLET | Freq: Every day | ORAL | Status: DC

## 2015-02-28 MED ORDER — AMLODIPINE BESYLATE 10 MG PO TABS: ORAL_TABLET | ORAL | Status: DC

## 2015-02-28 MED ORDER — POTASSIUM CHLORIDE CRYS ER 20 MEQ PO TBCR: 20.0000 meq | EXTENDED_RELEASE_TABLET | Freq: Two times a day (BID) | ORAL | Status: DC

## 2015-02-28 MED ORDER — ATENOLOL 25 MG PO TABS: ORAL_TABLET | ORAL | Status: DC

## 2015-03-05 ENCOUNTER — Ambulatory Visit (INDEPENDENT_AMBULATORY_CARE_PROVIDER_SITE_OTHER): Payer: BLUE CROSS/BLUE SHIELD | Admitting: Family Medicine

## 2015-03-05 VITALS — BP 162/90 | HR 86 | Temp 98.1°F | Resp 18 | Wt 210.0 lb

## 2015-03-05 DIAGNOSIS — R5381 Other malaise: Secondary | ICD-10-CM | POA: Diagnosis not present

## 2015-03-05 DIAGNOSIS — R059 Cough, unspecified: Secondary | ICD-10-CM

## 2015-03-05 DIAGNOSIS — R05 Cough: Secondary | ICD-10-CM

## 2015-03-05 DIAGNOSIS — J029 Acute pharyngitis, unspecified: Secondary | ICD-10-CM

## 2015-03-05 LAB — POCT CBC
Granulocyte percent: 46.5 %G (ref 37–80)
HCT, POC: 38.9 % (ref 37.7–47.9)
HEMOGLOBIN: 11.9 g/dL — AB (ref 12.2–16.2)
LYMPH, POC: 1.9 (ref 0.6–3.4)
MCH: 26 pg — AB (ref 27–31.2)
MCHC: 30.5 g/dL — AB (ref 31.8–35.4)
MCV: 85.2 fL (ref 80–97)
MID (CBC): 0.4 (ref 0–0.9)
MPV: 7.2 fL (ref 0–99.8)
POC Granulocyte: 2 (ref 2–6.9)
POC LYMPH PERCENT: 43.6 %L (ref 10–50)
POC MID %: 9.9 %M (ref 0–12)
Platelet Count, POC: 405 10*3/uL (ref 142–424)
RBC: 4.57 M/uL (ref 4.04–5.48)
RDW, POC: 15.6 %
WBC: 4.4 10*3/uL — AB (ref 4.6–10.2)

## 2015-03-05 LAB — POCT INFLUENZA A/B
Influenza A, POC: NEGATIVE
Influenza B, POC: NEGATIVE

## 2015-03-05 MED ORDER — OSELTAMIVIR PHOSPHATE 75 MG PO CAPS: 75.0000 mg | ORAL_CAPSULE | Freq: Two times a day (BID) | ORAL | Status: DC

## 2015-03-05 NOTE — Patient Instructions (Signed)
Good to see you today.  You may have the flu with a negative flu test.  Use the tamiflu twice a day for 5 days You can also use OTC medications as needed Let me know if you do not feel better in the next few days

## 2015-03-05 NOTE — Progress Notes (Signed)
[facility] [address] [place] [address] [phone]  Date:  03/05/2015   Name:  Jocelyn Ford   DOB:  1951/09/08   MRN:  [id number]  PCP:  Renato Shin, MD    Chief Complaint: Cough and Sore Throat   History of Present Illness:  Jocelyn Ford is a 64 y.o. very pleasant female patient who presents with the following:  Last month she had a heart cath- this looked ok.  She had a stress test that looked abnormal- this triggered her cath.   She is here today with illness. She notes a ST, feels "real dizzy," and she notes a cough.  She got sick 2 days ago, thought that she had a fever at first but this is resolvede She is tired, notes body aches. She has tried some OTC medications- last dose about 5 hours ago.   No GI symptoms but she does not have much appetite She has been taking some OTC medications that she thinks has decongestant in it; this may be why her BP is up  She had vertigo yesterday but this is now better.  She also notes a HA today.   She has had vertigo with illness in the past.    Patient Active Problem List   Diagnosis Date Noted  . Abnormal stress test   . Chest pain 09/27/2014  . Menopausal state 09/27/2014  . Gout 08/08/2014  . Routine general medical examination at a health care facility 10/03/2013  . Hypokalemia 10/03/2013  . Encounter for long-term (current) use of other medications 08/21/2013  . ACUTE GOUTY ARTHROPATHY 07/18/2009  . ANEMIA, IRON DEFICIENCY 11/07/2008  . HYPERCHOLESTEROLEMIA 11/01/2008  . ELECTROCARDIOGRAM, ABNORMAL 11/01/2008  . Essential hypertension 07/28/2007  . ALLERGIC RHINITIS 07/28/2007  . GERD 07/28/2007  . OSTEOPOROSIS 07/28/2007    Past Medical History  Diagnosis Date  . Dizziness and giddiness   . Dysuria   . Acute gouty arthropathy   . Iron deficiency anemia, unspecified   . Pure hypercholesterolemia   . Routine general medical examination at a health care facility   .  Nonspecific abnormal electrocardiogram (ECG) (EKG)   . Osteoporosis, unspecified   . Unspecified essential hypertension   . Esophageal reflux   . Allergic rhinitis, cause unspecified   . Arthritis     Past Surgical History  Procedure Laterality Date  . Abdominal hysterectomy    . Left heart catheterization with coronary angiogram N/A 01/28/2015    Procedure: LEFT HEART CATHETERIZATION WITH CORONARY ANGIOGRAM;  Surgeon: Candee Furbish, MD;  Location: [facility] CATH LAB;  Service: Cardiovascular;  Laterality: N/A;    History  Substance Use Topics  . Smoking status: Never Smoker   . Smokeless tobacco: Never Used  . Alcohol Use: No    Family History  Problem Relation Age of Onset  . Cancer Father     Prostate    Allergies  Allergen Reactions  . Doxycycline   . Nifedipine     REACTION: Nausea,weakness    Medication list has been reviewed and updated.  Current Outpatient Prescriptions on File Prior to Visit  Medication Sig Dispense Refill  . allopurinol (ZYLOPRIM) 300 MG tablet Take 1 tablet (300 mg total) by mouth daily. 90 tablet 3  . amLODipine (NORVASC) 10 MG tablet TAKE 1 TABLET (10 MG TOTAL) BY MOUTH DAILY. 90 tablet 0  . aspirin EC 81 MG tablet Take 1 tablet (81 mg total) by mouth daily. 90 tablet 3  .  atenolol (TENORMIN) 25 MG tablet TAKE 1 TABLET (25 MG TOTAL) BY MOUTH 2 (TWO) TIMES DAILY. 180 tablet 0  . atorvastatin (LIPITOR) 40 MG tablet Take 40 mg by mouth daily.    . furosemide (LASIX) 20 MG tablet Take 1 tablet (20 mg total) by mouth daily. 90 tablet 0  . Multiple Vitamin (MULTIVITAMIN) capsule Take 2 capsules by mouth daily.    Marland Kitchen omeprazole (PRILOSEC) 40 MG capsule TAKE ONE CAPSULE EVERY DAY 90 capsule 0  . potassium chloride SA (K-DUR,KLOR-CON) 20 MEQ tablet Take 1 tablet (20 mEq total) by mouth 2 (two) times daily. 180 tablet 0   No current facility-administered medications on file prior to visit.    Review of Systems:  As per HPI- otherwise  negative.   Physical Examination: Filed Vitals:   03/05/15 1100  BP: 155/88  Pulse: 86  Temp: 98.1 F (36.7 C)  Resp: 18   Filed Vitals:   03/05/15 1100  Weight: 210 lb (95.255 kg)   Body mass index is 32.88 kg/(m^2). Ideal Body Weight:    GEN: WDWN, NAD, Non-toxic, A & O x 3, obese, looks well HEENT: Atraumatic, Normocephalic. Neck supple. No masses, No LAD.  Bilateral TM wnl, oropharynx normal.  PEERL,EOMI.   Ears and Nose: No external deformity. CV: RRR, No M/G/R. No JVD. No thrill. No extra heart sounds. PULM: CTA B, no wheezes, crackles, rhonchi. No retractions. No resp. distress. No accessory muscle use. ABD: S, NT, ND, +BS. No rebound. No HSM.  Benign exam EXTR: No c/c/e NEURO Normal gait.  PSYCH: Normally interactive. Conversant. Not depressed or anxious appearing.  Calm demeanor.   BP Readings from Last 3 Encounters:  03/05/15 155/88  12/30/14 148/86  12/25/14 150/70     Results for orders placed or performed in visit on 03/05/15  POCT Influenza A/B  Result Value Ref Range   Influenza A, POC Negative    Influenza B, POC Negative   POCT CBC  Result Value Ref Range   WBC 4.4 (A) 4.6 - 10.2 K/uL   Lymph, poc 1.9 0.6 - 3.4   POC LYMPH PERCENT 43.6 10 - 50 %L   MID (cbc) 0.4 0 - 0.9   POC MID % 9.9 0 - 12 %M   POC Granulocyte 2.0 2 - 6.9   Granulocyte percent 46.5 37 - 80 %G   RBC 4.57 4.04 - 5.48 M/uL   Hemoglobin 11.9 (A) 12.2 - 16.2 g/dL   HCT, POC 38.9 37.7 - 47.9 %   MCV 85.2 80 - 97 fL   MCH, POC 26.0 (A) 27 - 31.2 pg   MCHC 30.5 (A) 31.8 - 35.4 g/dL   RDW, POC 15.6 %   Platelet Count, POC 405 142 - 424 K/uL   MPV 7.2 0 - 99.8 fL     Assessment and Plan: Malaise - Plan: POCT Influenza A/B, POCT CBC, oseltamivir (TAMIFLU) 75 MG capsule  Cough - Plan: POCT Influenza A/B, POCT CBC, oseltamivir (TAMIFLU) 75 MG capsule  Acute pharyngitis, unspecified pharyngitis type - Plan: POCT Influenza A/B, POCT CBC, oseltamivir (TAMIFLU) 75 MG  capsule  Possible flu with false negative rapid test.  Treat with tamflu, encouraged her to DC decongestants as they may be raising her BP.  She will follow-up if not better soon  Signed Lamar Blinks, MD

## 2015-03-06 ENCOUNTER — Ambulatory Visit: Payer: BLUE CROSS/BLUE SHIELD | Admitting: Cardiology

## 2015-03-06 ENCOUNTER — Telehealth: Payer: Self-pay

## 2015-03-06 NOTE — Telephone Encounter (Signed)
Patient was recently seen and is still feeling sick. She would like a note to excuse her from work until Monday. Patient phone! [phone]

## 2015-03-07 NOTE — Telephone Encounter (Signed)
Patient called again about her work note. She states that she wants Dr. Lorelei Pont to call her back so that she can let her know exactly what's going on and why she needs to be out until Monday.

## 2015-03-07 NOTE — Telephone Encounter (Signed)
Called her back- she is still not feeling good, she has a cough and congestion. No fever, she is not worse.  I will write a note excusing her until Monday.  She will pick this note up this weekend.  Letter has Barbara's name on it by I will Laurena Bering

## 2015-03-07 NOTE — Telephone Encounter (Signed)
Patient called to see if her work note is ready yet. It is not. Previous message documented at 7:30pm on 03/06/2015.....  [phone]

## 2015-03-12 ENCOUNTER — Encounter: Payer: Self-pay | Admitting: Cardiology

## 2015-05-15 ENCOUNTER — Telehealth: Payer: Self-pay | Admitting: Endocrinology

## 2015-05-15 NOTE — Telephone Encounter (Signed)
Pt was contacted on 05/15/2015 and a voicemail was left to make a B/P visit for recheck

## 2015-05-20 ENCOUNTER — Ambulatory Visit (INDEPENDENT_AMBULATORY_CARE_PROVIDER_SITE_OTHER): Payer: BLUE CROSS/BLUE SHIELD | Admitting: Family Medicine

## 2015-05-20 ENCOUNTER — Encounter: Payer: Self-pay | Admitting: Family Medicine

## 2015-05-20 VITALS — BP 138/80 | HR 99 | Temp 98.6°F | Resp 17 | Ht 67.6 in | Wt 219.0 lb

## 2015-05-20 DIAGNOSIS — R21 Rash and other nonspecific skin eruption: Secondary | ICD-10-CM

## 2015-05-20 MED ORDER — CLOTRIMAZOLE-BETAMETHASONE 1-0.05 % EX CREA: 1.0000 "application " | TOPICAL_CREAM | Freq: Two times a day (BID) | CUTANEOUS | Status: DC

## 2015-05-20 NOTE — Patient Instructions (Addendum)
Generic Lotrisone cream twice daily to rash  Try to keep the area clean and dry  If not improving over the next week or so please return

## 2015-05-20 NOTE — Progress Notes (Signed)
  Subjective:  Patient ID: Jocelyn Ford, female    DOB: 04-28-1951  Age: 64 y.o. MRN: [id number]  64 year old lady who has a rash along the low abdomen wall just underneath the pannus. She has probably had it for about 3 weeks. She does not have a lot of itching, but she does use some hydrocortisone cream in that area. She sweats a lot there. She has worn new underwear. Has not had any bites or bumps that were palpable. Before. 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   Objective:   No acute distress. There is a splotchy erythematous rash with areas from about 1-2 cm in diameter, 6-7 spots, along the fold of the low abdomen which are red and nonpalpable with vague borders. No other rash noted elsewhere on her. Back and under the breasts and the rest of abdominal wall all look normal. She is not diabetic.   Assessment & Plan:   Assessment: Dermatitis lower abdominal wall, etiology undetermined, possibly yeast  Plan:  The rash is very nonspecific, will treat with Lotrisone Patient Instructions  Generic Lotrisone cream twice daily to rash  Try to keep the area clean and dry  If not improving over the next week or so please return     HOPPER,DAVID, MD 05/20/2015

## 2015-05-28 ENCOUNTER — Other Ambulatory Visit: Payer: Self-pay | Admitting: Endocrinology

## 2015-06-02 ENCOUNTER — Telehealth: Payer: Self-pay | Admitting: *Deleted

## 2015-06-02 NOTE — Telephone Encounter (Signed)
Left message for patient to call back to discuss mammogram appointment

## 2015-06-27 ENCOUNTER — Ambulatory Visit: Payer: BLUE CROSS/BLUE SHIELD | Admitting: Endocrinology

## 2015-07-11 ENCOUNTER — Encounter: Payer: Self-pay | Admitting: Endocrinology

## 2015-07-11 ENCOUNTER — Ambulatory Visit (INDEPENDENT_AMBULATORY_CARE_PROVIDER_SITE_OTHER): Payer: BLUE CROSS/BLUE SHIELD | Admitting: Endocrinology

## 2015-07-11 VITALS — BP 176/86 | HR 90 | Temp 98.8°F | Resp 15 | Ht 67.0 in | Wt 207.0 lb

## 2015-07-11 DIAGNOSIS — I10 Essential (primary) hypertension: Secondary | ICD-10-CM | POA: Diagnosis not present

## 2015-07-11 MED ORDER — TRIAMTERENE-HCTZ 37.5-25 MG PO TABS: ORAL_TABLET | ORAL | Status: DC

## 2015-07-11 MED ORDER — AMLODIPINE BESYLATE 5 MG PO TABS: 5.0000 mg | ORAL_TABLET | Freq: Every day | ORAL | Status: DC

## 2015-07-11 NOTE — Patient Instructions (Addendum)
i have sent a prescription to your local pharmacy, to add another fluid pill.   Also, let's decrease the amlodipine.  Please come back for a regular physical appointment as scheduled.

## 2015-07-11 NOTE — Progress Notes (Signed)
Subjective:    Patient ID: Jocelyn Ford, female    DOB: 23-Feb-1951, 64 y.o.   MRN: [id number]  HPI Pt states 1 month of slight swelling of the legs, but no assoc pain Past Medical History  Diagnosis Date  . Dizziness and giddiness   . Dysuria   . Acute gouty arthropathy   . Iron deficiency anemia, unspecified   . Pure hypercholesterolemia   . Routine general medical examination at a health care facility   . Nonspecific abnormal electrocardiogram (ECG) (EKG)   . Osteoporosis, unspecified   . Unspecified essential hypertension   . Esophageal reflux   . Allergic rhinitis, cause unspecified   . Arthritis     Past Surgical History  Procedure Laterality Date  . Abdominal hysterectomy    . Left heart catheterization with coronary angiogram N/A 01/28/2015    Procedure: LEFT HEART CATHETERIZATION WITH CORONARY ANGIOGRAM;  Surgeon: Candee Furbish, MD;  Location: [facility] CATH LAB;  Service: Cardiovascular;  Laterality: N/A;    History   Social History  . Marital Status: Divorced    Spouse Name: n/a  . Number of Children: 1  . Years of Education: 13   Occupational History  . Magazine features editor   Social History Main Topics  . Smoking status: Never Smoker   . Smokeless tobacco: Never Used  . Alcohol Use: No  . Drug Use: No  . Sexual Activity: No   Other Topics Concern  . Not on file   Social History Narrative   Lives alone.  Her daughter lives nearby.    Current Outpatient Prescriptions on File Prior to Visit  Medication Sig Dispense Refill  . allopurinol (ZYLOPRIM) 300 MG tablet Take 1 tablet (300 mg total) by mouth daily. 90 tablet 3  . aspirin EC 81 MG tablet Take 1 tablet (81 mg total) by mouth daily. 90 tablet 3  . atenolol (TENORMIN) 25 MG tablet TAKE 1 TABLET TWICE A DAY 180 tablet 0  . atorvastatin (LIPITOR) 40 MG tablet Take 40 mg by mouth daily.    . clotrimazole-betamethasone (LOTRISONE) cream Apply 1 application topically 2 (two) times daily. 30  g 0  . furosemide (LASIX) 20 MG tablet TAKE 1 TABLET DAILY 90 tablet 0  . Multiple Vitamin (MULTIVITAMIN) capsule Take 2 capsules by mouth daily.    Marland Kitchen omeprazole (PRILOSEC) 40 MG capsule TAKE 1 CAPSULE DAILY 90 capsule 0  . potassium chloride SA (K-DUR,KLOR-CON) 20 MEQ tablet TAKE 1 TABLET TWICE A DAY 180 tablet 0   No current facility-administered medications on file prior to visit.    Allergies  Allergen Reactions  . Doxycycline   . Nifedipine     REACTION: Nausea,weakness    Family History  Problem Relation Age of Onset  . Cancer Father     Prostate    BP 176/86 mmHg  Pulse 90  Temp(Src) 98.8 F (37.1 C) (Oral)  Resp 15  Ht 5\' 7"  (1.702 m)  Wt 207 lb (93.895 kg)  BMI 32.41 kg/m2  SpO2 97%  Review of Systems Denies sob and itching of the legs.    Objective:   Physical Exam VITAL SIGNS:  See vs page GENERAL: no distress LUNGS:  Clear to auscultation Ext: trace bilat leg edema   Lab Results  Component Value Date   CREATININE 1.28* 01/22/2015   BUN 19 01/22/2015   NA 142 01/22/2015   K 3.5 01/22/2015   CL 105 01/22/2015   CO2 27 01/22/2015  Assessment & Plan:  Edema, worse, prob due to norvasc HTN: he needs increased rx Renal insufficiency: stable: we'll follow.      Patient is advised the following: Patient Instructions  i have sent a prescription to your local pharmacy, to add another fluid pill.   Also, let's decrease the amlodipine.  Please come back for a regular physical appointment as scheduled.

## 2015-07-14 ENCOUNTER — Telehealth: Payer: Self-pay | Admitting: Endocrinology

## 2015-07-14 NOTE — Telephone Encounter (Signed)
no

## 2015-07-14 NOTE — Telephone Encounter (Signed)
Could you please advise if there is any interaction between maxzide and benadryl during Dr. Cordelia Pen absence?

## 2015-07-14 NOTE — Telephone Encounter (Signed)
Pt was put on trimethazine, can she take benadry with this med?

## 2015-07-14 NOTE — Telephone Encounter (Signed)
Left voicemail advising pt of no interaction between Maxzide and benadryl. Requested call back if the pt would like to discuss.

## 2015-08-20 ENCOUNTER — Encounter: Payer: Self-pay | Admitting: Endocrinology

## 2015-08-20 ENCOUNTER — Telehealth: Payer: Self-pay

## 2015-08-20 ENCOUNTER — Ambulatory Visit (INDEPENDENT_AMBULATORY_CARE_PROVIDER_SITE_OTHER): Payer: BLUE CROSS/BLUE SHIELD | Admitting: Endocrinology

## 2015-08-20 VITALS — BP 136/80 | HR 84 | Temp 98.4°F | Ht 67.0 in | Wt 213.0 lb

## 2015-08-20 DIAGNOSIS — M109 Gout, unspecified: Secondary | ICD-10-CM

## 2015-08-20 DIAGNOSIS — Z23 Encounter for immunization: Secondary | ICD-10-CM | POA: Diagnosis not present

## 2015-08-20 DIAGNOSIS — D509 Iron deficiency anemia, unspecified: Secondary | ICD-10-CM | POA: Diagnosis not present

## 2015-08-20 DIAGNOSIS — Z0000 Encounter for general adult medical examination without abnormal findings: Secondary | ICD-10-CM

## 2015-08-20 DIAGNOSIS — N289 Disorder of kidney and ureter, unspecified: Secondary | ICD-10-CM

## 2015-08-20 DIAGNOSIS — R0789 Other chest pain: Secondary | ICD-10-CM

## 2015-08-20 DIAGNOSIS — I10 Essential (primary) hypertension: Secondary | ICD-10-CM

## 2015-08-20 DIAGNOSIS — E7800 Pure hypercholesterolemia, unspecified: Secondary | ICD-10-CM

## 2015-08-20 DIAGNOSIS — M810 Age-related osteoporosis without current pathological fracture: Secondary | ICD-10-CM

## 2015-08-20 DIAGNOSIS — M10071 Idiopathic gout, right ankle and foot: Secondary | ICD-10-CM

## 2015-08-20 LAB — IBC PANEL
Iron: 55 ug/dL (ref 42–145)
Saturation Ratios: 16.7 % — ABNORMAL LOW (ref 20.0–50.0)
Transferrin: 235 mg/dL (ref 212.0–360.0)

## 2015-08-20 LAB — LIPID PANEL
CHOL/HDL RATIO: 6
CHOLESTEROL: 234 mg/dL — AB (ref 0–200)
HDL: 39.9 mg/dL (ref >39.00)
LDL CALC: 168 mg/dL — AB (ref 0–99)
NONHDL: 193.8
Triglycerides: 129 mg/dL (ref 0.0–149.0)
VLDL: 25.8 mg/dL (ref 0.0–40.0)

## 2015-08-20 LAB — CBC WITH DIFFERENTIAL/PLATELET
BASOS PCT: 1 % (ref 0.0–3.0)
Basophils Absolute: 0 10*3/uL (ref 0.0–0.1)
EOS PCT: 5 % (ref 0.0–5.0)
Eosinophils Absolute: 0.2 10*3/uL (ref 0.0–0.7)
HEMATOCRIT: 36.1 % (ref 36.0–46.0)
Hemoglobin: 11.6 g/dL — ABNORMAL LOW (ref 12.0–15.0)
LYMPHS PCT: 42.8 % (ref 12.0–46.0)
Lymphs Abs: 1.9 10*3/uL (ref 0.7–4.0)
MCHC: 32.2 g/dL (ref 30.0–36.0)
MCV: 85.8 fl (ref 78.0–100.0)
MONOS PCT: 6.8 % (ref 3.0–12.0)
Monocytes Absolute: 0.3 10*3/uL (ref 0.1–1.0)
NEUTROS ABS: 1.9 10*3/uL (ref 1.4–7.7)
NEUTROS PCT: 44.4 % (ref 43.0–77.0)
PLATELETS: 373 10*3/uL (ref 150.0–400.0)
RBC: 4.21 Mil/uL (ref 3.87–5.11)
RDW: 14.9 % (ref 11.5–15.5)
WBC: 4.3 10*3/uL (ref 4.0–10.5)

## 2015-08-20 LAB — BASIC METABOLIC PANEL
BUN: 18 mg/dL (ref 6–23)
CALCIUM: 9.4 mg/dL (ref 8.4–10.5)
CO2: 32 meq/L (ref 19–32)
CREATININE: 1.11 mg/dL (ref 0.40–1.20)
Chloride: 101 mEq/L (ref 96–112)
GFR: 63.94 mL/min (ref >60.00)
GLUCOSE: 89 mg/dL (ref 70–99)
Potassium: 3.4 mEq/L — ABNORMAL LOW (ref 3.5–5.1)
SODIUM: 143 meq/L (ref 135–145)

## 2015-08-20 LAB — URIC ACID: Uric Acid, Serum: 6 mg/dL (ref 2.4–7.0)

## 2015-08-20 LAB — TSH: TSH: 1.13 u[IU]/mL (ref 0.35–4.50)

## 2015-08-20 NOTE — Progress Notes (Signed)
Subjective:    Patient ID: Jocelyn Ford, female    DOB: 1951/01/10, 64 y.o.   MRN: [id number]  HPI  Pt is here for regular wellness examination, and is feeling pretty well in general, and says chronic med probs are stable, except she does not take lipitor, due to arthralgias.   Past Medical History  Diagnosis Date  . Dizziness and giddiness   . Dysuria   . Acute gouty arthropathy   . Iron deficiency anemia, unspecified   . Pure hypercholesterolemia   . Routine general medical examination at a health care facility   . Nonspecific abnormal electrocardiogram (ECG) (EKG)   . Osteoporosis, unspecified   . Unspecified essential hypertension   . Esophageal reflux   . Allergic rhinitis, cause unspecified   . Arthritis     Past Surgical History  Procedure Laterality Date  . Abdominal hysterectomy    . Left heart catheterization with coronary angiogram N/A 01/28/2015    Procedure: LEFT HEART CATHETERIZATION WITH CORONARY ANGIOGRAM;  Surgeon: Candee Furbish, MD;  Location: [facility] CATH LAB;  Service: Cardiovascular;  Laterality: N/A;    Social History   Social History  . Marital Status: Divorced    Spouse Name: n/a  . Number of Children: 1  . Years of Education: 13   Occupational History  . Magazine features editor   Social History Main Topics  . Smoking status: Never Smoker   . Smokeless tobacco: Never Used  . Alcohol Use: No  . Drug Use: No  . Sexual Activity: No   Other Topics Concern  . Not on file   Social History Narrative   Lives alone.  Her daughter lives nearby.    Current Outpatient Prescriptions on File Prior to Visit  Medication Sig Dispense Refill  . allopurinol (ZYLOPRIM) 300 MG tablet Take 1 tablet (300 mg total) by mouth daily. 90 tablet 3  . amLODipine (NORVASC) 5 MG tablet Take 1 tablet (5 mg total) by mouth daily. 90 tablet 3  . aspirin EC 81 MG tablet Take 1 tablet (81 mg total) by mouth daily. 90 tablet 3  . atenolol (TENORMIN) 25 MG tablet  TAKE 1 TABLET TWICE A DAY 180 tablet 0  . atorvastatin (LIPITOR) 40 MG tablet Take 40 mg by mouth daily.    . clotrimazole-betamethasone (LOTRISONE) cream Apply 1 application topically 2 (two) times daily. 30 g 0  . furosemide (LASIX) 20 MG tablet TAKE 1 TABLET DAILY 90 tablet 0  . Multiple Vitamin (MULTIVITAMIN) capsule Take 2 capsules by mouth daily.    Marland Kitchen omeprazole (PRILOSEC) 40 MG capsule TAKE 1 CAPSULE DAILY 90 capsule 0  . potassium chloride SA (K-DUR,KLOR-CON) 20 MEQ tablet TAKE 1 TABLET TWICE A DAY 180 tablet 0  . triamterene-hydrochlorothiazide (MAXZIDE-25) 37.5-25 MG per tablet 1/2 tab daily 30 tablet 3   No current facility-administered medications on file prior to visit.    Allergies  Allergen Reactions  . Doxycycline   . Nifedipine     REACTION: Nausea,weakness    Family History  Problem Relation Age of Onset  . Cancer Father     Prostate    BP 136/80 mmHg  Pulse 84  Temp(Src) 98.4 F (36.9 C) (Oral)  Ht 5\' 7"  (1.702 m)  Wt 213 lb (96.616 kg)  BMI 33.35 kg/m2  SpO2 98%  Review of Systems  Constitutional: Negative for fever and unexpected weight change.  HENT: Negative for hearing loss.   Eyes: Negative for visual disturbance.  Respiratory: Negative  for shortness of breath.   Cardiovascular: Negative for chest pain.  Gastrointestinal: Negative for anal bleeding.  Endocrine: Negative for cold intolerance.  Genitourinary: Negative for hematuria.  Musculoskeletal: Negative for back pain.  Skin: Negative for rash.  Allergic/Immunologic: Positive for environmental allergies.  Neurological: Negative for syncope and numbness.  Hematological: Does not bruise/bleed easily.  Psychiatric/Behavioral: Negative for dysphoric mood.       Objective:   Physical Exam VS: see vs page GEN: no distress HEAD: head: no deformity eyes: no periorbital swelling, no proptosis external nose and ears are normal mouth: no lesion seen NECK: supple, thyroid is not  enlarged CHEST WALL: no deformity LUNGS:  Clear to auscultation BREASTS: sees gyn CV: reg rate and rhythm, no murmur ABD: abdomen is soft, nontender.  no hepatosplenomegaly.  not distended.  no hernia GENITALIA/RECTAL: sees gyn MUSCULOSKELETAL: muscle bulk and strength are grossly normal.  no obvious joint swelling.  gait is normal and steady EXTEMITIES: no deformity.  no ulcer on the feet.  feet are of normal color and temp.  no edema PULSES: dorsalis pedis intact bilat.  no carotid bruit NEURO:  cn 2-12 grossly intact.   readily moves all 4's.  sensation is intact to touch on the feet SKIN:  Normal texture and temperature.  No rash or suspicious lesion is visible.   NODES:  None palpable at the neck PSYCH: alert, well-oriented.  Does not appear anxious nor depressed.        Assessment & Plan:  Wellness visit today, with problems stable, except as noted.   Patient is advised the following: Patient Instructions  please consider these measures for your health:  minimize alcohol.  do not use tobacco products.  have a colonoscopy at least every 10 years from age 49.  Women should have an annual mammogram from age 39.  keep firearms safely stored.  always use seat belts.  have working smoke alarms in your home.  see an eye doctor and dentist regularly.  never drive under the influence of alcohol or drugs (including prescription drugs).   blood tests are requested for you today.  We'll let you know about the results. it is critically important to prevent falling down (keep floor areas well-lit, dry, and free of loose objects.  If you have a cane, walker, or wheelchair, you should use it, even for short trips around the house.  Also, try not to rush).  [facility] [phone], to make an appointment for a mammogram.  Please return in 1 year.   addendum: try taking lipitor, even if it is just 1/2 tab qd

## 2015-08-20 NOTE — Telephone Encounter (Signed)
Pt will get B/P recheck on f/u visit 08/20/2015

## 2015-08-20 NOTE — Progress Notes (Signed)
we discussed code status.  pt requests full code, but would not want to be started or maintained on artificial life-support measures if there was not a reasonable chance of recovery 

## 2015-08-20 NOTE — Patient Instructions (Addendum)
please consider these measures for your health:  minimize alcohol.  do not use tobacco products.  have a colonoscopy at least every 10 years from age 62.  Women should have an annual mammogram from age 72.  keep firearms safely stored.  always use seat belts.  have working smoke alarms in your home.  see an eye doctor and dentist regularly.  never drive under the influence of alcohol or drugs (including prescription drugs).   blood tests are requested for you today.  We'll let you know about the results. it is critically important to prevent falling down (keep floor areas well-lit, dry, and free of loose objects.  If you have a cane, walker, or wheelchair, you should use it, even for short trips around the house.  Also, try not to rush).  [facility] [phone], to make an appointment for a mammogram.  Please return in 1 year.

## 2015-08-25 ENCOUNTER — Other Ambulatory Visit: Payer: Self-pay | Admitting: Endocrinology

## 2015-08-27 ENCOUNTER — Other Ambulatory Visit: Payer: Self-pay | Admitting: Endocrinology

## 2015-08-28 ENCOUNTER — Telehealth: Payer: Self-pay | Admitting: Endocrinology

## 2015-08-28 NOTE — Telephone Encounter (Signed)
I contacted the pt and she stated she is currently using 5mg  of Amlodipine.

## 2015-08-28 NOTE — Telephone Encounter (Signed)
Requested call back from the pt to discuss.  

## 2015-08-28 NOTE — Telephone Encounter (Signed)
please call patient: Please check the amlodipine bottle you are taking.  Is it 5 mg or 10 mg (or do you take both)?

## 2015-08-29 NOTE — Telephone Encounter (Signed)
Thank you :)

## 2015-09-01 NOTE — Telephone Encounter (Signed)
I contacted the pt and provided the number to call and reschedule the appointment with.

## 2015-09-01 NOTE — Telephone Encounter (Signed)
Patient stated that she need to cancel her bone density test please advise

## 2015-09-02 ENCOUNTER — Inpatient Hospital Stay: Admission: RE | Admit: 2015-09-02 | Payer: BLUE CROSS/BLUE SHIELD | Source: Ambulatory Visit

## 2015-10-07 ENCOUNTER — Telehealth: Payer: Self-pay | Admitting: Endocrinology

## 2015-10-07 NOTE — Telephone Encounter (Signed)
Patient ask if you would let her know if her Cpe visit was coded correctly, so insurance will pay,please advise

## 2015-10-07 NOTE — Telephone Encounter (Signed)
Left pt a VM to inform her that her CPE coding was corrected by doctor ellison

## 2015-10-17 ENCOUNTER — Ambulatory Visit (INDEPENDENT_AMBULATORY_CARE_PROVIDER_SITE_OTHER): Payer: BLUE CROSS/BLUE SHIELD | Admitting: Physician Assistant

## 2015-10-17 ENCOUNTER — Ambulatory Visit (INDEPENDENT_AMBULATORY_CARE_PROVIDER_SITE_OTHER): Payer: BLUE CROSS/BLUE SHIELD

## 2015-10-17 VITALS — BP 174/89 | HR 91 | Temp 98.2°F | Resp 16 | Ht 67.0 in | Wt 217.0 lb

## 2015-10-17 DIAGNOSIS — R05 Cough: Secondary | ICD-10-CM

## 2015-10-17 DIAGNOSIS — R058 Other specified cough: Secondary | ICD-10-CM

## 2015-10-17 DIAGNOSIS — B9789 Other viral agents as the cause of diseases classified elsewhere: Principal | ICD-10-CM

## 2015-10-17 DIAGNOSIS — J069 Acute upper respiratory infection, unspecified: Secondary | ICD-10-CM | POA: Diagnosis not present

## 2015-10-17 MED ORDER — BENZONATATE 100 MG PO CAPS: 100.0000 mg | ORAL_CAPSULE | Freq: Three times a day (TID) | ORAL | Status: DC | PRN

## 2015-10-17 MED ORDER — GUAIFENESIN ER 1200 MG PO TB12: 1.0000 | ORAL_TABLET | Freq: Two times a day (BID) | ORAL | Status: DC | PRN

## 2015-10-17 MED ORDER — IPRATROPIUM BROMIDE 0.03 % NA SOLN: 2.0000 | Freq: Two times a day (BID) | NASAL | Status: DC

## 2015-10-17 MED ORDER — HYDROCOD POLST-CPM POLST ER 10-8 MG/5ML PO SUER: 5.0000 mL | Freq: Every evening | ORAL | Status: DC | PRN

## 2015-10-17 NOTE — Patient Instructions (Signed)
Please continue to hydrate drinking about 64 oz of water per day, if not more.  Upper Respiratory Infection, Adult Most upper respiratory infections (URIs) are a viral infection of the air passages leading to the lungs. A URI affects the nose, throat, and upper air passages. The most common type of URI is nasopharyngitis and is typically referred to as "the common cold." URIs run their course and usually go away on their own. Most of the time, a URI does not require medical attention, but sometimes a bacterial infection in the upper airways can follow a viral infection. This is called a secondary infection. Sinus and middle ear infections are common types of secondary upper respiratory infections. Bacterial pneumonia can also complicate a URI. A URI can worsen asthma and chronic obstructive pulmonary disease (COPD). Sometimes, these complications can require emergency medical care and may be life threatening.  CAUSES Almost all URIs are caused by viruses. A virus is a type of germ and can spread from one person to another.  RISKS FACTORS You may be at risk for a URI if:   You smoke.   You have chronic heart or lung disease.  You have a weakened defense (immune) system.   You are very young or very old.   You have nasal allergies or asthma.  You work in crowded or poorly ventilated areas.  You work in health care facilities or schools. SIGNS AND SYMPTOMS  Symptoms typically develop 2-3 days after you come in contact with a cold virus. Most viral URIs last 7-10 days. However, viral URIs from the influenza virus (flu virus) can last 14-18 days and are typically more severe. Symptoms may include:   Runny or stuffy (congested) nose.   Sneezing.   Cough.   Sore throat.   Headache.   Fatigue.   Fever.   Loss of appetite.   Pain in your forehead, behind your eyes, and over your cheekbones (sinus pain).  Muscle aches.  DIAGNOSIS  Your health care provider may  diagnose a URI by:  Physical exam.  Tests to check that your symptoms are not due to another condition such as:  Strep throat.  Sinusitis.  Pneumonia.  Asthma. TREATMENT  A URI goes away on its own with time. It cannot be cured with medicines, but medicines may be prescribed or recommended to relieve symptoms. Medicines may help:  Reduce your fever.  Reduce your cough.  Relieve nasal congestion. HOME CARE INSTRUCTIONS   Take medicines only as directed by your health care provider.   Gargle warm saltwater or take cough drops to comfort your throat as directed by your health care provider.  Use a warm mist humidifier or inhale steam from a shower to increase air moisture. This may make it easier to breathe.  Drink enough fluid to keep your urine clear or pale yellow.   Eat soups and other clear broths and maintain good nutrition.   Rest as needed.   Return to work when your temperature has returned to normal or as your health care provider advises. You may need to stay home longer to avoid infecting others. You can also use a face mask and careful hand washing to prevent spread of the virus.  Increase the usage of your inhaler if you have asthma.   Do not use any tobacco products, including cigarettes, chewing tobacco, or electronic cigarettes. If you need help quitting, ask your health care provider. PREVENTION  The best way to protect yourself from getting a cold  is to practice good hygiene.   Avoid oral or hand contact with people with cold symptoms.   Wash your hands often if contact occurs.  There is no clear evidence that vitamin C, vitamin E, echinacea, or exercise reduces the chance of developing a cold. However, it is always recommended to get plenty of rest, exercise, and practice good nutrition.  SEEK MEDICAL CARE IF:   You are getting worse rather than better.   Your symptoms are not controlled by medicine.   You have chills.  You have  worsening shortness of breath.  You have brown or red mucus.  You have yellow or brown nasal discharge.  You have pain in your face, especially when you bend forward.  You have a fever.  You have swollen neck glands.  You have pain while swallowing.  You have white areas in the back of your throat. SEEK IMMEDIATE MEDICAL CARE IF:   You have severe or persistent:  Headache.  Ear pain.  Sinus pain.  Chest pain.  You have chronic lung disease and any of the following:  Wheezing.  Prolonged cough.  Coughing up blood.  A change in your usual mucus.  You have a stiff neck.  You have changes in your:  Vision.  Hearing.  Thinking.  Mood. MAKE SURE YOU:   Understand these instructions.  Will watch your condition.  Will get help right away if you are not doing well or get worse.   This information is not intended to replace advice given to you by your health care provider. Make sure you discuss any questions you have with your health care provider.   Document Released: 06/01/2001 Document Revised: 04/22/2015 Document Reviewed: 03/13/2014 Elsevier Interactive Patient Education [employer].

## 2015-10-17 NOTE — Progress Notes (Signed)
[facility] [address] [place] [address] [phone]  Date:  10/17/2015   Name:  Jocelyn Ford   DOB:  22-Mar-1951   MRN:  [id number]  PCP:  Renato Shin, MD   Chief Complaint  Patient presents with  . Cough  . Sore Throat   History of Present Illness:  Jocelyn Ford is a 64 y.o. female patient with PMH of HTN, HL renal insufficiency, and angina who presents to [facility] for chief complaint of productive cough for about 3 days.  Sxs began with nasal congestion and nasal drainage, but this resolved, with productive cough of green sputum.  Cough keeps her up at night.  She has no dyspnea or sob.  No fever.  She does feel drainage at the inside of her ear.   No nausea or abdominal pain.  No drainage or hearing loss. She has been taking sudafed and aspirin for her symptoms.  The sudafed is helping her congestion, but generally raises her blood pressure. She is taking her anti-hypertensives at this time--atenolol, maxzide, furosemide.     Past Medical History  Diagnosis Date  . Dizziness and giddiness   . Dysuria   . Acute gouty arthropathy   . Iron deficiency anemia, unspecified   . Pure hypercholesterolemia   . Routine general medical examination at a health care facility   . Nonspecific abnormal electrocardiogram (ECG) (EKG)   . Osteoporosis, unspecified   . Unspecified essential hypertension   . Esophageal reflux   . Allergic rhinitis, cause unspecified   . Arthritis     Past Surgical History  Procedure Laterality Date  . Abdominal hysterectomy    . Left heart catheterization with coronary angiogram N/A 01/28/2015    Procedure: LEFT HEART CATHETERIZATION WITH CORONARY ANGIOGRAM;  Surgeon: Candee Furbish, MD;  Location: [facility] CATH LAB;  Service: Cardiovascular;  Laterality: N/A;    Social History  Substance Use Topics  . Smoking status: Never Smoker   . Smokeless tobacco: Never Used  . Alcohol Use: No    Family History  Problem Relation Age of  Onset  . Cancer Father     Prostate    Allergies  Allergen Reactions  . Doxycycline   . Nifedipine     REACTION: Nausea,weakness    Medication list has been reviewed and updated.  Current Outpatient Prescriptions on File Prior to Visit  Medication Sig Dispense Refill  . allopurinol (ZYLOPRIM) 300 MG tablet TAKE 1 TABLET DAILY 90 tablet 2  . amLODipine (NORVASC) 5 MG tablet Take 1 tablet (5 mg total) by mouth daily. 90 tablet 3  . aspirin EC 81 MG tablet Take 1 tablet (81 mg total) by mouth daily. 90 tablet 3  . atenolol (TENORMIN) 25 MG tablet TAKE 1 TABLET TWICE A DAY 180 tablet 1  . atorvastatin (LIPITOR) 40 MG tablet Take 40 mg by mouth daily.    . clotrimazole-betamethasone (LOTRISONE) cream Apply 1 application topically 2 (two) times daily. 30 g 0  . furosemide (LASIX) 20 MG tablet TAKE 1 TABLET DAILY 90 tablet 1  . Multiple Vitamin (MULTIVITAMIN) capsule Take 2 capsules by mouth daily.    Marland Kitchen omeprazole (PRILOSEC) 40 MG capsule TAKE 1 CAPSULE DAILY 90 capsule 1  . potassium chloride SA (K-DUR,KLOR-CON) 20 MEQ tablet TAKE 1 TABLET TWICE A DAY 180 tablet 1  . triamterene-hydrochlorothiazide (MAXZIDE-25) 37.5-25 MG per tablet 1/2 tab daily 30 tablet 3   No current facility-administered medications on file prior to visit.  ROS ROS otherwise unremarkable unless listed above.   Physical Examination: BP 174/89 mmHg  Pulse 91  Temp(Src) 98.2 F (36.8 C)  Resp 16  Ht 5\' 7"  (1.702 m)  Wt 217 lb (98.431 kg)  BMI 33.98 kg/m2 Ideal Body Weight: Weight in (lb) to have BMI = 25: 159.3  Physical Exam  Constitutional: She is oriented to person, place, and time. She appears well-developed and well-nourished. No distress.  HENT:  Head: Normocephalic and atraumatic.  Right Ear: Tympanic membrane, external ear and ear canal normal.  Left Ear: Tympanic membrane, external ear and ear canal normal.  Nose: Mucosal edema and rhinorrhea present. Right sinus exhibits frontal sinus  tenderness. Right sinus exhibits no maxillary sinus tenderness. Left sinus exhibits frontal sinus tenderness. Left sinus exhibits no maxillary sinus tenderness.  Mouth/Throat: No uvula swelling. No oropharyngeal exudate, posterior oropharyngeal edema or posterior oropharyngeal erythema.  Eyes: Conjunctivae and EOM are normal. Pupils are equal, round, and reactive to light.  Cardiovascular: Normal rate and regular rhythm.  Exam reveals no gallop, no distant heart sounds and no friction rub.   No murmur heard. Pulses:      Radial pulses are 2+ on the right side, and 2+ on the left side.  Pulmonary/Chest: Effort normal. No respiratory distress. She has no decreased breath sounds. She has no wheezes. She has no rhonchi.  Neurological: She is alert and oriented to person, place, and time.  Skin: She is not diaphoretic.  Psychiatric: She has a normal mood and affect. Her behavior is normal.    [facility] reading (PRIMARY) by  Dr. Carlota Raspberry: Lower increased markings on later view but otherwise negative.   Assessment and Plan: Jocelyn Ford is a 64 y.o. female who is here today for cc of sorethroat, and cough.  Appears to be viral URI at this time.   Treating supportively and advised to hydrate well.  Will contact in 5 days, if sxs continue, where abx may be issued.   Viral URI with cough  Productive cough - Plan: DG Chest 2 View, Guaifenesin (MUCINEX MAXIMUM STRENGTH) 1200 MG TB12, benzonatate (TESSALON) 100 MG capsule, ipratropium (ATROVENT) 0.03 % nasal spray, chlorpheniramine-HYDROcodone (TUSSIONEX PENNKINETIC ER) 10-8 MG/5ML SUER  Ivar Drape, PA-C [facility] 10/17/2015 8:57 AM

## 2016-01-07 ENCOUNTER — Ambulatory Visit (INDEPENDENT_AMBULATORY_CARE_PROVIDER_SITE_OTHER): Payer: BLUE CROSS/BLUE SHIELD | Admitting: Endocrinology

## 2016-01-07 ENCOUNTER — Encounter: Payer: Self-pay | Admitting: Endocrinology

## 2016-01-07 VITALS — BP 152/94 | HR 88 | Temp 98.6°F | Ht 67.0 in | Wt 222.0 lb

## 2016-01-07 DIAGNOSIS — R5382 Chronic fatigue, unspecified: Secondary | ICD-10-CM | POA: Diagnosis not present

## 2016-01-07 DIAGNOSIS — R5383 Other fatigue: Secondary | ICD-10-CM | POA: Insufficient documentation

## 2016-01-07 DIAGNOSIS — D509 Iron deficiency anemia, unspecified: Secondary | ICD-10-CM

## 2016-01-07 DIAGNOSIS — R51 Headache: Secondary | ICD-10-CM | POA: Diagnosis not present

## 2016-01-07 DIAGNOSIS — N289 Disorder of kidney and ureter, unspecified: Secondary | ICD-10-CM

## 2016-01-07 DIAGNOSIS — R42 Dizziness and giddiness: Secondary | ICD-10-CM | POA: Insufficient documentation

## 2016-01-07 DIAGNOSIS — R519 Headache, unspecified: Secondary | ICD-10-CM | POA: Insufficient documentation

## 2016-01-07 LAB — BASIC METABOLIC PANEL
BUN: 16 mg/dL (ref 6–23)
CALCIUM: 9.4 mg/dL (ref 8.4–10.5)
CO2: 31 meq/L (ref 19–32)
Chloride: 100 mEq/L (ref 96–112)
Creatinine, Ser: 1.38 mg/dL — ABNORMAL HIGH (ref 0.40–1.20)
GFR: 49.67 mL/min — AB (ref >60.00)
GLUCOSE: 96 mg/dL (ref 70–99)
POTASSIUM: 3.1 meq/L — AB (ref 3.5–5.1)
SODIUM: 141 meq/L (ref 135–145)

## 2016-01-07 LAB — CBC WITH DIFFERENTIAL/PLATELET
BASOS ABS: 0 10*3/uL (ref 0.0–0.1)
Basophils Relative: 0.5 % (ref 0.0–3.0)
Eosinophils Absolute: 0.2 10*3/uL (ref 0.0–0.7)
Eosinophils Relative: 5 % (ref 0.0–5.0)
HCT: 36 % (ref 36.0–46.0)
Hemoglobin: 11.6 g/dL — ABNORMAL LOW (ref 12.0–15.0)
LYMPHS ABS: 1.6 10*3/uL (ref 0.7–4.0)
Lymphocytes Relative: 37.6 % (ref 12.0–46.0)
MCHC: 32.4 g/dL (ref 30.0–36.0)
MCV: 84.7 fl (ref 78.0–100.0)
MONO ABS: 0.3 10*3/uL (ref 0.1–1.0)
MONOS PCT: 7.6 % (ref 3.0–12.0)
NEUTROS ABS: 2.1 10*3/uL (ref 1.4–7.7)
NEUTROS PCT: 49.3 % (ref 43.0–77.0)
PLATELETS: 386 10*3/uL (ref 150.0–400.0)
RBC: 4.25 Mil/uL (ref 3.87–5.11)
RDW: 15.5 % (ref 11.5–15.5)
WBC: 4.2 10*3/uL (ref 4.0–10.5)

## 2016-01-07 LAB — IBC PANEL
IRON: 56 ug/dL (ref 42–145)
SATURATION RATIOS: 18.3 % — AB (ref 20.0–50.0)
Transferrin: 219 mg/dL (ref 212.0–360.0)

## 2016-01-07 LAB — TSH: TSH: 1.03 u[IU]/mL (ref 0.35–4.50)

## 2016-01-07 LAB — SEDIMENTATION RATE: Sed Rate: 58 mm/hr — ABNORMAL HIGH (ref 0–22)

## 2016-01-07 MED ORDER — POTASSIUM CHLORIDE CRYS ER 20 MEQ PO TBCR: 20.0000 meq | EXTENDED_RELEASE_TABLET | Freq: Three times a day (TID) | ORAL | Status: DC

## 2016-01-07 NOTE — Patient Instructions (Signed)
blood tests are requested for you today.  We'll let you know about the results. Also, let's check a 24-HR urine test. If these are ok, please let me know if the symptoms persist, so I can request for you to see a neurologist.

## 2016-01-07 NOTE — Progress Notes (Signed)
Subjective:    Patient ID: AZURI YA, female    DOB: [birth year], 65 y.o.   MRN: [id number]  HPI Pt states 1 month of moderate diaphoresis of the feet, and assoc lightheadedness.  Past Medical History  Diagnosis Date  . Dizziness and giddiness   . Dysuria   . Acute gouty arthropathy   . Iron deficiency anemia, unspecified   . Pure hypercholesterolemia   . Routine general medical examination at a health care facility   . Nonspecific abnormal electrocardiogram (ECG) (EKG)   . Osteoporosis, unspecified   . Unspecified essential hypertension   . Esophageal reflux   . Allergic rhinitis, cause unspecified   . Arthritis     Past Surgical History  Procedure Laterality Date  . Abdominal hysterectomy    . Left heart catheterization with coronary angiogram N/A 01/28/2015    Procedure: LEFT HEART CATHETERIZATION WITH CORONARY ANGIOGRAM;  Surgeon: Candee Furbish, MD;  Location: [facility] CATH LAB;  Service: Cardiovascular;  Laterality: N/A;    Social History   Social History  . Marital Status: Divorced    Spouse Name: n/a  . Number of Children: 1  . Years of Education: 13   Occupational History  . Magazine features editor   Social History Main Topics  . Smoking status: Never Smoker   . Smokeless tobacco: Never Used  . Alcohol Use: No  . Drug Use: No  . Sexual Activity: No   Other Topics Concern  . Not on file   Social History Narrative   Lives alone.  Her daughter lives nearby.    Current Outpatient Prescriptions on File Prior to Visit  Medication Sig Dispense Refill  . allopurinol (ZYLOPRIM) 300 MG tablet TAKE 1 TABLET DAILY 90 tablet 2  . amLODipine (NORVASC) 5 MG tablet Take 1 tablet (5 mg total) by mouth daily. 90 tablet 3  . aspirin EC 81 MG tablet Take 1 tablet (81 mg total) by mouth daily. 90 tablet 3  . atenolol (TENORMIN) 25 MG tablet TAKE 1 TABLET TWICE A DAY 180 tablet 1  . atorvastatin (LIPITOR) 40 MG tablet Take 40 mg by mouth daily.    .  furosemide (LASIX) 20 MG tablet TAKE 1 TABLET DAILY 90 tablet 1  . ipratropium (ATROVENT) 0.03 % nasal spray Place 2 sprays into both nostrils 2 (two) times daily. 30 mL 0  . Multiple Vitamin (MULTIVITAMIN) capsule Take 2 capsules by mouth daily.    Marland Kitchen omeprazole (PRILOSEC) 40 MG capsule TAKE 1 CAPSULE DAILY 90 capsule 1  . triamterene-hydrochlorothiazide (MAXZIDE-25) 37.5-25 MG per tablet 1/2 tab daily 30 tablet 3   No current facility-administered medications on file prior to visit.    Allergies  Allergen Reactions  . Doxycycline   . Nifedipine     REACTION: Nausea,weakness    Family History  Problem Relation Age of Onset  . Cancer Father     Prostate    BP 152/94 mmHg  Pulse 88  Temp(Src) 98.6 F (37 C) (Oral)  Ht 5\' 7"  (1.702 m)  Wt 222 lb (100.699 kg)  BMI 34.76 kg/m2  SpO2 95%   Review of Systems  Constitutional: Negative for fever.  Eyes: Negative for visual disturbance.  Cardiovascular: Positive for palpitations. Negative for chest pain.  Gastrointestinal: Negative for blood in stool.  Endocrine: Negative for cold intolerance and heat intolerance.  Genitourinary: Negative for hematuria.  Neurological: Negative for syncope.   She has fatigue and intermittent headache.  She has weight gain.  Objective:   Physical Exam VS: see vs page GEN: no distress HEAD: head: no deformity eyes: no periorbital swelling, no proptosis external nose and ears are normal mouth: no lesion seen Both eac's and tm's are normal NECK: supple, thyroid is not enlarged CHEST WALL: no deformity LUNGS:  Clear to auscultation CV: reg rate and rhythm, no murmur. MUSCULOSKELETAL: muscle bulk and strength are grossly normal.  no obvious joint swelling.  gait is normal and steady EXTEMITIES: no deformity.  no edema PULSES: no carotid bruit NEURO:  cn 2-12 grossly intact.  readily moves all 4's.  sensation is intact to touch on all 4's SKIN:  Normal texture and temperature.  No rash or  suspicious lesion is visible.   NODES:  None palpable at the neck PSYCH: alert, well-oriented.  Does not appear anxious nor depressed.      Assessment & Plan:  Dizziness, new, uncertain etiology Headache, new, uncertain etiology   Patient is advised the following: Patient Instructions  blood tests are requested for you today.  We'll let you know about the results. Also, let's check a 24-HR urine test. If these are ok, please let me know if the symptoms persist, so I can request for you to see a neurologist.    addendum: ref Edmon Crape

## 2016-01-13 LAB — CATECHOLAMINES, FRACTIONATED, URINE, 24 HOUR
Calculated Total (E+NE): 32 mcg/24 h (ref 26–121)
Creatinine, Urine mg/day-CATEUR: 1.22 g/(24.h) (ref 0.63–2.50)
DOPAMINE, 24 HR URINE: 211 ug/(24.h) (ref 52–480)
Norepinephrine, 24 hr Ur: 32 mcg/24 h (ref 15–100)
TOTAL VOLUME - CF 24HR U: 1700 mL

## 2016-01-14 ENCOUNTER — Other Ambulatory Visit: Payer: Self-pay | Admitting: Endocrinology

## 2016-01-16 LAB — METANEPHRINES, URINE, 24 HOUR
METANEPH TOTAL UR: 511 ug/(24.h) (ref 224–832)
Metanephrines, Ur: 87 mcg/24 h — ABNORMAL LOW (ref 90–315)
NORMETANEPHRINE 24H UR: 424 ug/(24.h) (ref 122–676)

## 2016-01-30 ENCOUNTER — Encounter: Payer: Self-pay | Admitting: Neurology

## 2016-01-30 ENCOUNTER — Ambulatory Visit (INDEPENDENT_AMBULATORY_CARE_PROVIDER_SITE_OTHER): Payer: BLUE CROSS/BLUE SHIELD | Admitting: Neurology

## 2016-01-30 VITALS — BP 138/84 | HR 88 | Ht 67.0 in | Wt 222.0 lb

## 2016-01-30 DIAGNOSIS — G44229 Chronic tension-type headache, not intractable: Secondary | ICD-10-CM | POA: Diagnosis not present

## 2016-01-30 MED ORDER — TIZANIDINE HCL 2 MG PO TABS: ORAL_TABLET | ORAL | Status: DC

## 2016-01-30 NOTE — Patient Instructions (Signed)
1.  Start tizanidine 2mg  tablet.  Take 1 tablet at bedtime for 7 days, then 1 tablet twice daily for 7 days, then 1 tablet three times daily.  Caution for drowsiness 2.  Limit use of Excedrin (or all pain relievers) to no more than 2 days out of the week 3.  No caffeine or soda 4.  Call in 4 weeks with update and we can either change dose or continue current dose 5.  Follow up

## 2016-01-30 NOTE — Progress Notes (Signed)
NEUROLOGY CONSULTATION NOTE  Jocelyn Ford MRN: [id number] DOB: Apr 29, 1951  Referring provider: Dr. Loanne Drilling Primary care provider: Dr. Loanne Drilling  Reason for consult:  Headache and dizziness  HISTORY OF PRESENT ILLNESS: Jocelyn Ford is a 65 year old right-handed female with hypercholesterolemia, chronic fatigue, and iron-deficiency anemia who presents for headache and dizziness.  History obtained by patient and PCP note.  Labs reviewed.  About 3 months ago, she started having headache.  It is frontal/top and back of head.  It is a non-throbbing 6/10 ache.  It occurs off and on daily but lasts about 30 minutes at a time with Excedrin.  There is no associated nausea, photophobia, phonophobia or visual disturbance.  She notes some neck tightness.  She takes Excedrin daily.  She reports that she had multiple changes in medication several months ago, but cannot think of any other possible precipitating factor.  She reports some improvement in headaches since onset.  She denies prior history of headaches, except for "sinus headaches".  Very rarely, she has a sharp pain in the left parietal region, 8/10 and lasting about 1 to 2 hours.    She also reports dizziness.  It is positional, triggered by change in head position.  It is a spinning sensation which is brief.  Sed Rate 58.  TSH 1.03.  CBC unremarkable.  BMP showed K 3.1 and Cr 1.38 with BUN of 16.    PAST MEDICAL HISTORY: Past Medical History  Diagnosis Date  . Dizziness and giddiness   . Dysuria   . Acute gouty arthropathy   . Iron deficiency anemia, unspecified   . Pure hypercholesterolemia   . Routine general medical examination at a health care facility   . Nonspecific abnormal electrocardiogram (ECG) (EKG)   . Osteoporosis, unspecified   . Unspecified essential hypertension   . Esophageal reflux   . Allergic rhinitis, cause unspecified   . Arthritis     PAST SURGICAL HISTORY: Past Surgical History  Procedure  Laterality Date  . Abdominal hysterectomy    . Left heart catheterization with coronary angiogram N/A 01/28/2015    Procedure: LEFT HEART CATHETERIZATION WITH CORONARY ANGIOGRAM;  Surgeon: Candee Furbish, MD;  Location: [facility] CATH LAB;  Service: Cardiovascular;  Laterality: N/A;    MEDICATIONS: Current Outpatient Prescriptions on File Prior to Visit  Medication Sig Dispense Refill  . allopurinol (ZYLOPRIM) 300 MG tablet TAKE 1 TABLET DAILY 90 tablet 2  . amLODipine (NORVASC) 5 MG tablet TAKE 1 TABLET DAILY 90 tablet 0  . aspirin EC 81 MG tablet Take 1 tablet (81 mg total) by mouth daily. 90 tablet 3  . atenolol (TENORMIN) 25 MG tablet TAKE 1 TABLET TWICE A DAY 180 tablet 1  . atorvastatin (LIPITOR) 40 MG tablet Take 40 mg by mouth daily.    . furosemide (LASIX) 20 MG tablet TAKE 1 TABLET DAILY 90 tablet 1  . ipratropium (ATROVENT) 0.03 % nasal spray Place 2 sprays into both nostrils 2 (two) times daily. 30 mL 0  . Multiple Vitamin (MULTIVITAMIN) capsule Take 2 capsules by mouth daily.    Marland Kitchen omeprazole (PRILOSEC) 40 MG capsule TAKE 1 CAPSULE DAILY 90 capsule 1  . potassium chloride SA (K-DUR,KLOR-CON) 20 MEQ tablet Take 1 tablet (20 mEq total) by mouth 3 (three) times daily. 90 tablet 11  . triamterene-hydrochlorothiazide (MAXZIDE-25) 37.5-25 MG per tablet 1/2 tab daily 30 tablet 3   No current facility-administered medications on file prior to visit.    ALLERGIES: Allergies  Allergen Reactions  .  Doxycycline   . Nifedipine     REACTION: Nausea,weakness    FAMILY HISTORY: Family History  Problem Relation Age of Onset  . Cancer Father     Prostate  . Alzheimer's disease Mother     SOCIAL HISTORY: Social History   Social History  . Marital Status: Divorced    Spouse Name: n/a  . Number of Children: 1  . Years of Education: 13   Occupational History  . Magazine features editor   Social History Main Topics  . Smoking status: Former Research scientist (life sciences)  . Smokeless tobacco: Never  Used  . Alcohol Use: No  . Drug Use: No  . Sexual Activity: No   Other Topics Concern  . Not on file   Social History Narrative   Lives alone.  Her daughter lives nearby.    REVIEW OF SYSTEMS: Constitutional: No fevers, chills, or sweats, no generalized fatigue, change in appetite Eyes: No visual changes, double vision, eye pain Ear, nose and throat: No hearing loss, ear pain, nasal congestion, sore throat Cardiovascular: No chest pain, palpitations Respiratory:  No shortness of breath at rest or with exertion, wheezes GastrointestinaI: No nausea, vomiting, diarrhea, abdominal pain, fecal incontinence Genitourinary:  No dysuria, urinary retention or frequency Musculoskeletal:  No neck pain, back pain Integumentary: No rash, pruritus, skin lesions Neurological: as above Psychiatric: No depression, insomnia, anxiety Endocrine: No palpitations, fatigue, diaphoresis, mood swings, change in appetite, change in weight, increased thirst Hematologic/Lymphatic:  No anemia, purpura, petechiae. Allergic/Immunologic: no itchy/runny eyes, nasal congestion, recent allergic reactions, rashes  PHYSICAL EXAM: Filed Vitals:   01/30/16 0805  BP: 138/84  Pulse: 88   General: No acute distress.  Patient appears well-groomed.  Head:  Normocephalic/atraumatic.  No tenderness or palpation of temporal artery on left Eyes:  fundi unremarkable, without vessel changes, exudates, hemorrhages or papilledema. Neck: supple, no paraspinal tenderness, full range of motion Back: No paraspinal tenderness Heart: regular rate and rhythm Lungs: Clear to auscultation bilaterally. Vascular: No carotid bruits. Neurological Exam: Mental status: alert and oriented to person, place, and time, recent and remote memory intact, fund of knowledge intact, attention and concentration intact, speech fluent and not dysarthric, language intact. Cranial nerves: CN I: not tested CN II: pupils equal, round and reactive to  light, visual fields intact, fundi unremarkable, without vessel changes, exudates, hemorrhages or papilledema. CN III, IV, VI:  full range of motion, no nystagmus, no ptosis CN V: facial sensation intact CN VII: upper and lower face symmetric CN VIII: hearing intact CN IX, X: gag intact, uvula midline CN XI: sternocleidomastoid and trapezius muscles intact CN XII: tongue midline Bulk & Tone: normal, no fasciculations. Motor:  5/5 throughout Sensation: temperature and vibration sensation intact. Deep Tendon Reflexes:  absent throughout, toes downgoing. Finger to nose testing:  Without dysmetria.  Heel to shin:  Without dysmetria.  Gait:  Normal station and stride.  Able to turn and tandem walk. Romberg negative.  IMPRESSION: Chronic tension-type headache.  Neurologic exam is normal.  Sed Rate mildly elevated but not significant enough for me to suspect temporal arteritis.  Overall, she reports headaches have improved somewhat. BPPV  PLAN: 1.  We will start tizanidine 2mg  tablets, titrating up to 3 times daily (caution for drowsiness) 2.  No caffeine or soda 3.  Limit use of Excedrin to no more than 2 days out of the week. 4.  Offered referral for vestibular rehab for dizziness, but she declines at this time.  Thank you for allowing me  to take part in the care of this patient.  [name], DO  CC:  Renato Shin, MD

## 2016-02-02 ENCOUNTER — Telehealth: Payer: Self-pay

## 2016-02-02 ENCOUNTER — Ambulatory Visit: Payer: BLUE CROSS/BLUE SHIELD | Admitting: Endocrinology

## 2016-02-02 NOTE — Telephone Encounter (Signed)
Message relayed to patient. Verbalized understanding and denied questions.   

## 2016-02-02 NOTE — Telephone Encounter (Signed)
She can start half-tablet first and see how she tolerates it. If doing okay, take 1 tablet at bedtime.  Low BP is not a common side effect, so she should be okay.

## 2016-02-02 NOTE — Telephone Encounter (Signed)
Patient was given ZANAFLEX on Friday at [facility]. Pt was reading info on medication and sees it can cause low BP. States she is already on a couple BP and wants to know if medication is safe for her to take. Please advise.

## 2016-02-21 ENCOUNTER — Other Ambulatory Visit: Payer: Self-pay | Admitting: Endocrinology

## 2016-02-26 ENCOUNTER — Other Ambulatory Visit: Payer: Self-pay | Admitting: Endocrinology

## 2016-02-27 ENCOUNTER — Ambulatory Visit (INDEPENDENT_AMBULATORY_CARE_PROVIDER_SITE_OTHER): Payer: BLUE CROSS/BLUE SHIELD | Admitting: Physician Assistant

## 2016-02-27 VITALS — BP 140/80 | HR 89 | Temp 98.2°F | Resp 18 | Ht 67.5 in | Wt 220.0 lb

## 2016-02-27 DIAGNOSIS — J069 Acute upper respiratory infection, unspecified: Secondary | ICD-10-CM

## 2016-02-27 LAB — POCT INFLUENZA A/B
Influenza A, POC: NEGATIVE
Influenza B, POC: NEGATIVE

## 2016-02-27 LAB — POCT RAPID STREP A (OFFICE): Rapid Strep A Screen: NEGATIVE

## 2016-02-27 MED ORDER — BENZONATATE 100 MG PO CAPS: 100.0000 mg | ORAL_CAPSULE | Freq: Three times a day (TID) | ORAL | Status: DC | PRN

## 2016-02-27 MED ORDER — IPRATROPIUM BROMIDE 0.03 % NA SOLN: 2.0000 | Freq: Two times a day (BID) | NASAL | Status: DC

## 2016-02-27 NOTE — Patient Instructions (Addendum)
     IF you received an x-ray today, you will receive an invoice from [facility]. Please contact [facility] at [phone] with questions or concerns regarding your invoice.   IF you received labwork today, you will receive an invoice from [facility]. Please contact [facility] at [phone] with questions or concerns regarding your invoice.   Our billing staff will not be able to assist you with questions regarding bills from these companies.  You will be contacted with the lab results as soon as they are available. The fastest way to get your results is to activate your My Chart account. Instructions are located on the last page of this paperwork. If you have not heard from us regarding the results in 2 weeks, please contact this office.     We recommend that you schedule a mammogram for breast cancer screening. Typically, you do not need a referral to do this. Please contact a local imaging center to schedule your mammogram.  [facility] - [phone]  *ask for the Radiology Department [facility]) - [phone] or [phone]  [facility] - [phone] [facility] - [phone] [facility] - [phone]  *ask for the Radiology Department [facility] - [phone]  *ask for the Radiology Department [facility] - [phone]  *ask for the Mammography Department [facility] - [phone]  

## 2016-02-27 NOTE — Progress Notes (Signed)
Patient ID: Jocelyn Ford, female    DOB: Oct 26, 1951, 65 y.o.   MRN: [id number]  PCP: Renato Shin, MD  Subjective:   Chief Complaint  Patient presents with  . Chest Congestion    X 3 days  . Sore Throat    X 3 days    HPI The patient is a 65 year old female PMH of HTN, HLD, Arthritis, GERD and osteoporosis who presents with dizziness, sore throat, dry cough, and increased fatigue and lethargy x2 days. She denies fever but endorses some body aches, cold seats and one episode of vomiting Tuesday morning. The patient has taken OTC Musinex with relief of chest tightness and congestion, but no mucus production. She describes her throat as scratchy and itching, without pain when swallowing. Denies ear fullness, ear drainage. Endorses nasal and sinus congestion. Denies SOB, Chest pain or palpitations.   Of note, she did not receive the flu vaccination this season. She endorses sick contacts with URI and the flu at work.   Review of Systems As above.    Patient Active Problem List   Diagnosis Date Noted  . Chronic tension-type headache, not intractable 01/30/2016  . Dizziness and giddiness 01/07/2016  . Headache 01/07/2016  . Fatigue 01/07/2016  . Renal insufficiency 08/20/2015  . Abnormal stress test   . Chest pain 09/27/2014  . Menopausal state 09/27/2014  . Gout 08/08/2014  . Routine general medical examination at a health care facility 10/03/2013  . Hypokalemia 10/03/2013  . Encounter for long-term (current) use of other medications 08/21/2013  . ACUTE GOUTY ARTHROPATHY 07/18/2009  . Iron deficiency anemia 11/07/2008  . HYPERCHOLESTEROLEMIA 11/01/2008  . ELECTROCARDIOGRAM, ABNORMAL 11/01/2008  . Essential hypertension 07/28/2007  . ALLERGIC RHINITIS 07/28/2007  . GERD 07/28/2007  . Osteoporosis 07/28/2007     Prior to Admission medications   Medication Sig Start Date End Date Taking? Authorizing Provider  allopurinol (ZYLOPRIM) 300 MG tablet TAKE 1 TABLET DAILY  08/27/15  Yes Renato Shin, MD  amLODipine (NORVASC) 5 MG tablet TAKE 1 TABLET DAILY 01/14/16  Yes Renato Shin, MD  aspirin EC 81 MG tablet Take 1 tablet (81 mg total) by mouth daily. 12/25/14  Yes Jerline Pain, MD  atenolol (TENORMIN) 25 MG tablet TAKE 1 TABLET TWICE A DAY 02/23/16  Yes Renato Shin, MD  furosemide (LASIX) 20 MG tablet TAKE 1 TABLET DAILY 02/23/16  Yes Renato Shin, MD  Multiple Vitamin (MULTIVITAMIN) capsule Take 2 capsules by mouth daily.   Yes Historical Provider, MD  omeprazole (PRILOSEC) 40 MG capsule TAKE 1 CAPSULE DAILY 02/23/16  Yes Renato Shin, MD  potassium chloride SA (K-DUR,KLOR-CON) 20 MEQ tablet TAKE 1 TABLET TWICE A DAY 02/23/16  Yes Renato Shin, MD  triamterene-hydrochlorothiazide ([id number]) 37.5-25 MG tablet TAKE 1/2 TABLET A DAY 02/26/16  Yes Renato Shin, MD  atorvastatin (LIPITOR) 40 MG tablet Take 40 mg by mouth daily. Reported on 02/27/2016    Historical Provider, MD  ipratropium (ATROVENT) 0.03 % nasal spray Place 2 sprays into both nostrils 2 (two) times daily. Patient not taking: Reported on 02/27/2016 10/17/15   Dorian Heckle English, PA  tiZANidine (ZANAFLEX) 2 MG tablet Take 1tab at bedtime for 7 days, then 1 tab twice daily for 7 days, then 1 tab three times daily Patient not taking: Reported on 02/27/2016 01/30/16   Pieter Partridge, DO     Allergies  Allergen Reactions  . Doxycycline   . Nifedipine     REACTION: Nausea,weakness  Objective:  Physical Exam  Constitutional: She is oriented to person, place, and time. She appears well-developed and well-nourished. She is active and cooperative. No distress.  BP 140/80 mmHg  Pulse 89  Temp(Src) 98.2 F (36.8 C) (Oral)  Resp 18  Ht 5' 7.5" (1.715 m)  Wt 220 lb (99.791 kg)  BMI 33.93 kg/m2  SpO2 99%   HENT:  Head: Normocephalic and atraumatic.  Right Ear: Hearing, tympanic membrane, external ear and ear canal normal.  Left Ear: Hearing, tympanic membrane, external ear and ear canal normal.  Nose:  Mucosal edema present. No rhinorrhea.  No foreign bodies. Right sinus exhibits no maxillary sinus tenderness and no frontal sinus tenderness. Left sinus exhibits no maxillary sinus tenderness and no frontal sinus tenderness.  Mouth/Throat: Uvula is midline, oropharynx is clear and moist and mucous membranes are normal. No uvula swelling. No oropharyngeal exudate.  Eyes: Conjunctivae and EOM are normal. Pupils are equal, round, and reactive to light. Right eye exhibits no discharge. Left eye exhibits no discharge. No scleral icterus.  Neck: Trachea normal, normal range of motion, full passive range of motion without pain and phonation normal. Neck supple. No thyroid mass and no thyromegaly present.  Cardiovascular: Normal rate, regular rhythm and normal heart sounds.   Pulses:      Radial pulses are 2+ on the right side.  Pulmonary/Chest: Effort normal and breath sounds normal.  Lymphadenopathy:       Head (right side): Submandibular and tonsillar adenopathy present. No preauricular, no posterior auricular and no occipital adenopathy present.       Head (left side): Submandibular and tonsillar adenopathy present. No preauricular and no occipital adenopathy present.    She has no cervical adenopathy.       Right: No supraclavicular adenopathy present.       Left: No supraclavicular adenopathy present.  Neurological: She is alert and oriented to person, place, and time. She has normal strength. No cranial nerve deficit or sensory deficit.  Skin: Skin is warm, dry and intact. No rash noted.  Psychiatric: She has a normal mood and affect. Her speech is normal and behavior is normal.   Results for orders placed or performed in visit on 02/27/16  POCT Influenza A/B  Result Value Ref Range   Influenza A, POC Negative Negative   Influenza B, POC Negative Negative  POCT rapid strep A  Result Value Ref Range   Rapid Strep A Screen Negative Negative           Assessment & Plan:   1. Acute  upper respiratory infection Supportive care.  Anticipatory guidance.  RTC if symptoms worsen/persist. - POCT Influenza A/B - POCT rapid strep A - Culture, Group A Strep - benzonatate (TESSALON) 100 MG capsule; Take 1-2 capsules (100-200 mg total) by mouth 3 (three) times daily as needed for cough.  Dispense: 40 capsule; Refill: 0 - ipratropium (ATROVENT) 0.03 % nasal spray; Place 2 sprays into both nostrils 2 (two) times daily.  Dispense: 30 mL; Refill: 0   Fara Chute, PA-C Physician Assistant-Certified [facility]

## 2016-02-27 NOTE — Progress Notes (Signed)
Subjective:     Patient ID: Jocelyn Ford, female   DOB: 06-14-1951, 65 y.o.   MRN: [id number]  HPI The patient is a 65 year old female PMH of HTN, HLD, Arthritis, GERD and osteoporosis who presents with dizziness, sore throat, dry cough, and increased fatigue and lethargy x2 days. She denies fever but endorses some body aches, cold seats and one episode of vomiting Tuesday morning. The patient has taken OTC Musinex with relief of chest tightness and congestion, but no mucus production. She describes her throat as scratchy and itching, without pain when swallowing. Denies ear fullness, ear drainage. Endorses nasal and sinus congestion. Denies SOB, Chest pain or palpitations.   Of note, she did not receive the flu vaccination this season. She endorses sick contacts with URI and the flu at work.   The family history, social history and past medical history have been reviewed with the patient.  Review of Systems Pertinent ROS as above in HPI.    Objective:   Physical Exam  Constitutional: She appears well-developed and well-nourished. No distress.  HENT:  Right Ear: External ear normal.  Left Ear: External ear normal.  Nose: Nose normal.  Mouth/Throat: Oropharynx is clear and moist. No oropharyngeal exudate.  No posterior adenopathy or erythema  Neck: Neck supple.  Anterior chain lymphadenopathy with tenderness to palpation  Cardiovascular: Normal rate, regular rhythm, normal heart sounds and intact distal pulses.  Exam reveals no gallop and no friction rub.   No murmur heard. Pulmonary/Chest: Effort normal and breath sounds normal. No respiratory distress. She has no wheezes. She has no rales. She exhibits no tenderness.     Assessment:     The patient is a 65 year old black female who presents with upper respiratory symptoms x2 days. Chest clear to auscultation, some cervical lymphadenopathy, throat soreness and dry cough. Rapid strep and influenza nasopharyngeal swab negative.  Patient likely has Viral URI, will encourage patient to increase fluids, rest, hydration, and OTC musinex as needed.     Plan:     1. Acute upper respiratory infection - POCT Influenza A/B - POCT rapid strep A - Culture, Group A Strep - Encourage patient to continue to hydrate with fluids, broths, and rest. - Continue to take Musinex OTC as needed for chest congestion - If symptoms persist for over 7 days or with increasing chest tightness, or cough production/mucus please come back to office.

## 2016-02-28 LAB — CULTURE, GROUP A STREP: Organism ID, Bacteria: NORMAL

## 2016-03-10 ENCOUNTER — Telehealth: Payer: Self-pay | Admitting: Endocrinology

## 2016-03-10 NOTE — Telephone Encounter (Signed)
Please advise ov, as we need to check BP and breathing

## 2016-03-10 NOTE — Telephone Encounter (Signed)
I contacted the pt and advised of note below. Pt scheduled of 03/11/2016 at 8 am.

## 2016-03-10 NOTE — Telephone Encounter (Signed)
See note below and please advise, Thanks! 

## 2016-03-10 NOTE — Telephone Encounter (Signed)
Pt said that she is starting to retain fluid from the blood pressure medication that he put her on.

## 2016-03-11 ENCOUNTER — Ambulatory Visit (INDEPENDENT_AMBULATORY_CARE_PROVIDER_SITE_OTHER): Payer: BLUE CROSS/BLUE SHIELD | Admitting: Endocrinology

## 2016-03-11 ENCOUNTER — Encounter: Payer: Self-pay | Admitting: Endocrinology

## 2016-03-11 VITALS — BP 134/64 | HR 84 | Temp 98.3°F | Ht 67.5 in | Wt 224.0 lb

## 2016-03-11 DIAGNOSIS — N289 Disorder of kidney and ureter, unspecified: Secondary | ICD-10-CM

## 2016-03-11 MED ORDER — AMLODIPINE BESYLATE 2.5 MG PO TABS: 2.5000 mg | ORAL_TABLET | Freq: Every day | ORAL | Status: DC

## 2016-03-11 MED ORDER — FUROSEMIDE 40 MG PO TABS: 40.0000 mg | ORAL_TABLET | Freq: Every day | ORAL | Status: DC

## 2016-03-11 NOTE — Patient Instructions (Addendum)
i have sent prescriptions to your pharmacy, to double the furosemide and half the amlodipine. In 2 weeks, please come in for BP check and blood tests.

## 2016-03-11 NOTE — Progress Notes (Signed)
Subjective:    Patient ID: Jocelyn Ford, female    DOB: 08-14-1951, 65 y.o.   MRN: [id number]  HPI Pt states few months of slight swelling of the legs, and slight assoc rash on the ankles.  Past Medical History  Diagnosis Date  . Dizziness and giddiness   . Dysuria   . Acute gouty arthropathy   . Iron deficiency anemia, unspecified   . Pure hypercholesterolemia   . Routine general medical examination at a health care facility   . Nonspecific abnormal electrocardiogram (ECG) (EKG)   . Osteoporosis, unspecified   . Unspecified essential hypertension   . Esophageal reflux   . Allergic rhinitis, cause unspecified   . Arthritis     Past Surgical History  Procedure Laterality Date  . Abdominal hysterectomy    . Left heart catheterization with coronary angiogram N/A 01/28/2015    Procedure: LEFT HEART CATHETERIZATION WITH CORONARY ANGIOGRAM;  Surgeon: Candee Furbish, MD;  Location: [facility] CATH LAB;  Service: Cardiovascular;  Laterality: N/A;    Social History   Social History  . Marital Status: Divorced    Spouse Name: n/a  . Number of Children: 1  . Years of Education: 13   Occupational History  . Magazine features editor   Social History Main Topics  . Smoking status: Former Research scientist (life sciences)  . Smokeless tobacco: Never Used  . Alcohol Use: No  . Drug Use: No  . Sexual Activity: No   Other Topics Concern  . Not on file   Social History Narrative   Lives alone.  Her daughter lives nearby.    Current Outpatient Prescriptions on File Prior to Visit  Medication Sig Dispense Refill  . allopurinol (ZYLOPRIM) 300 MG tablet TAKE 1 TABLET DAILY 90 tablet 2  . aspirin EC 81 MG tablet Take 1 tablet (81 mg total) by mouth daily. 90 tablet 3  . atenolol (TENORMIN) 25 MG tablet TAKE 1 TABLET TWICE A DAY 180 tablet 0  . ipratropium (ATROVENT) 0.03 % nasal spray Place 2 sprays into both nostrils 2 (two) times daily. 30 mL 0  . Multiple Vitamin (MULTIVITAMIN) capsule Take 2  capsules by mouth daily.    Marland Kitchen omeprazole (PRILOSEC) 40 MG capsule TAKE 1 CAPSULE DAILY 90 capsule 0  . potassium chloride SA (K-DUR,KLOR-CON) 20 MEQ tablet TAKE 1 TABLET TWICE A DAY 180 tablet 0  . triamterene-hydrochlorothiazide (MAXZIDE-25) 37.5-25 MG tablet TAKE 1/2 TABLET A DAY 30 tablet 3  . atorvastatin (LIPITOR) 40 MG tablet Take 40 mg by mouth daily. Reported on 03/11/2016    . tiZANidine (ZANAFLEX) 2 MG tablet Take 1tab at bedtime for 7 days, then 1 tab twice daily for 7 days, then 1 tab three times daily (Patient not taking: Reported on 02/27/2016) 90 tablet 0   No current facility-administered medications on file prior to visit.    Allergies  Allergen Reactions  . Doxycycline   . Nifedipine     REACTION: Nausea,weakness    Family History  Problem Relation Age of Onset  . Cancer Father     Prostate  . Alzheimer's disease Mother     BP 134/64 mmHg  Pulse 84  Temp(Src) 98.3 F (36.8 C) (Oral)  Ht 5' 7.5" (1.715 m)  Wt 224 lb (101.606 kg)  BMI 34.55 kg/m2  SpO2 98%  Review of Systems Denies sob, but she has fatigue.      Objective:   Physical Exam VITAL SIGNS:  See vs page GENERAL: no distress Ext: 1+  bilat leg edema Skin: mild spotty red rash on the ankles.      Assessment & Plan:  Edema: worse. HTN: well-controlled hypokalemia: she'll need f/u of this with an med change.   Patient is advised the following: Patient Instructions  i have sent prescriptions to your pharmacy, to double the furosemide and half the amlodipine. In 2 weeks, please come in for BP check and blood tests.

## 2016-03-24 ENCOUNTER — Other Ambulatory Visit (INDEPENDENT_AMBULATORY_CARE_PROVIDER_SITE_OTHER): Payer: BLUE CROSS/BLUE SHIELD

## 2016-03-24 ENCOUNTER — Other Ambulatory Visit: Payer: Self-pay | Admitting: Endocrinology

## 2016-03-24 ENCOUNTER — Telehealth: Payer: Self-pay

## 2016-03-24 DIAGNOSIS — N289 Disorder of kidney and ureter, unspecified: Secondary | ICD-10-CM

## 2016-03-24 DIAGNOSIS — E876 Hypokalemia: Secondary | ICD-10-CM

## 2016-03-24 LAB — BASIC METABOLIC PANEL
BUN: 16 mg/dL (ref 6–23)
CHLORIDE: 95 meq/L — AB (ref 96–112)
CO2: 36 meq/L — AB (ref 19–32)
Calcium: 9.7 mg/dL (ref 8.4–10.5)
Creatinine, Ser: 1.28 mg/dL — ABNORMAL HIGH (ref 0.40–1.20)
GFR: 54.14 mL/min — ABNORMAL LOW (ref >60.00)
Glucose, Bld: 96 mg/dL (ref 70–99)
POTASSIUM: 2.5 meq/L — AB (ref 3.5–5.1)
Sodium: 139 mEq/L (ref 135–145)

## 2016-03-24 NOTE — Telephone Encounter (Signed)
Pt came in for a BP check today. BP was 132/70. Pt wanted to know if any adjustments in her meds should be made. Thanks!

## 2016-03-24 NOTE — Telephone Encounter (Signed)
Please continue the same medication.   

## 2016-03-25 NOTE — Telephone Encounter (Signed)
Left a vm advising pt to continue the same BP medication. Requested a call back if the pt would like to discuss.

## 2016-03-26 ENCOUNTER — Other Ambulatory Visit (INDEPENDENT_AMBULATORY_CARE_PROVIDER_SITE_OTHER): Payer: BLUE CROSS/BLUE SHIELD

## 2016-03-26 DIAGNOSIS — E876 Hypokalemia: Secondary | ICD-10-CM | POA: Diagnosis not present

## 2016-03-26 LAB — BASIC METABOLIC PANEL
BUN: 13 mg/dL (ref 6–23)
CALCIUM: 9.3 mg/dL (ref 8.4–10.5)
CO2: 29 meq/L (ref 19–32)
Chloride: 101 mEq/L (ref 96–112)
Creatinine, Ser: 1.25 mg/dL — ABNORMAL HIGH (ref 0.40–1.20)
GFR: 55.64 mL/min — AB (ref >60.00)
GLUCOSE: 102 mg/dL — AB (ref 70–99)
Potassium: 3 mEq/L — ABNORMAL LOW (ref 3.5–5.1)
Sodium: 140 mEq/L (ref 135–145)

## 2016-03-26 MED ORDER — POTASSIUM CHLORIDE CRYS ER 20 MEQ PO TBCR: 20.0000 meq | EXTENDED_RELEASE_TABLET | Freq: Three times a day (TID) | ORAL | Status: DC

## 2016-04-12 ENCOUNTER — Telehealth: Payer: Self-pay | Admitting: Endocrinology

## 2016-04-12 DIAGNOSIS — N289 Disorder of kidney and ureter, unspecified: Secondary | ICD-10-CM

## 2016-04-12 NOTE — Telephone Encounter (Signed)
Ever since starting the lasix pt is having leg cramps and is taking potassium supplement please advise

## 2016-04-12 NOTE — Addendum Note (Signed)
Addended by: Renato Shin on: 04/12/2016 03:57 PM   Modules accepted: Orders

## 2016-04-12 NOTE — Telephone Encounter (Signed)
Ok, next step is to recheck the blood test.  i have ordered.

## 2016-04-12 NOTE — Telephone Encounter (Signed)
See note below and please advise, Thanks! 

## 2016-04-12 NOTE — Telephone Encounter (Signed)
Pt scheduled to come in tomorrow at 345 pm to have blood test done.

## 2016-04-13 ENCOUNTER — Other Ambulatory Visit: Payer: Self-pay | Admitting: Endocrinology

## 2016-04-13 ENCOUNTER — Other Ambulatory Visit (INDEPENDENT_AMBULATORY_CARE_PROVIDER_SITE_OTHER): Payer: BLUE CROSS/BLUE SHIELD

## 2016-04-13 DIAGNOSIS — N289 Disorder of kidney and ureter, unspecified: Secondary | ICD-10-CM | POA: Diagnosis not present

## 2016-04-13 LAB — BASIC METABOLIC PANEL
BUN: 14 mg/dL (ref 6–23)
CHLORIDE: 98 meq/L (ref 96–112)
CO2: 31 mEq/L (ref 19–32)
Calcium: 9.5 mg/dL (ref 8.4–10.5)
Creatinine, Ser: 1.26 mg/dL — ABNORMAL HIGH (ref 0.40–1.20)
GFR: 55.12 mL/min — AB (ref >60.00)
Glucose, Bld: 88 mg/dL (ref 70–99)
POTASSIUM: 3.1 meq/L — AB (ref 3.5–5.1)
SODIUM: 139 meq/L (ref 135–145)

## 2016-04-13 MED ORDER — POTASSIUM CHLORIDE CRYS ER 20 MEQ PO TBCR: 40.0000 meq | EXTENDED_RELEASE_TABLET | Freq: Two times a day (BID) | ORAL | Status: DC

## 2016-05-21 ENCOUNTER — Other Ambulatory Visit: Payer: Self-pay | Admitting: Endocrinology

## 2016-06-11 ENCOUNTER — Ambulatory Visit: Payer: BLUE CROSS/BLUE SHIELD | Admitting: Neurology

## 2016-07-25 ENCOUNTER — Other Ambulatory Visit: Payer: Self-pay | Admitting: Endocrinology

## 2016-08-18 ENCOUNTER — Encounter: Payer: Self-pay | Admitting: Endocrinology

## 2016-08-18 ENCOUNTER — Ambulatory Visit (INDEPENDENT_AMBULATORY_CARE_PROVIDER_SITE_OTHER): Payer: BLUE CROSS/BLUE SHIELD | Admitting: Endocrinology

## 2016-08-18 ENCOUNTER — Ambulatory Visit
Admission: RE | Admit: 2016-08-18 | Discharge: 2016-08-18 | Disposition: A | Discharge status: Home / Self Care | Payer: BLUE CROSS/BLUE SHIELD | Source: Ambulatory Visit | Attending: Endocrinology | Admitting: Endocrinology

## 2016-08-18 VITALS — BP 140/82 | HR 81 | Wt 219.0 lb

## 2016-08-18 DIAGNOSIS — I10 Essential (primary) hypertension: Secondary | ICD-10-CM | POA: Diagnosis not present

## 2016-08-18 DIAGNOSIS — D509 Iron deficiency anemia, unspecified: Secondary | ICD-10-CM

## 2016-08-18 DIAGNOSIS — M109 Gout, unspecified: Secondary | ICD-10-CM

## 2016-08-18 DIAGNOSIS — M810 Age-related osteoporosis without current pathological fracture: Secondary | ICD-10-CM | POA: Diagnosis not present

## 2016-08-18 DIAGNOSIS — M10071 Idiopathic gout, right ankle and foot: Secondary | ICD-10-CM | POA: Diagnosis not present

## 2016-08-18 DIAGNOSIS — R0600 Dyspnea, unspecified: Secondary | ICD-10-CM

## 2016-08-18 DIAGNOSIS — E7800 Pure hypercholesterolemia, unspecified: Secondary | ICD-10-CM | POA: Diagnosis not present

## 2016-08-18 DIAGNOSIS — N289 Disorder of kidney and ureter, unspecified: Secondary | ICD-10-CM | POA: Diagnosis not present

## 2016-08-18 LAB — CBC WITH DIFFERENTIAL/PLATELET
BASOS PCT: 0.4 % (ref 0.0–3.0)
Basophils Absolute: 0 10*3/uL (ref 0.0–0.1)
EOS ABS: 0.2 10*3/uL (ref 0.0–0.7)
EOS PCT: 4.3 % (ref 0.0–5.0)
HEMATOCRIT: 33.4 % — AB (ref 36.0–46.0)
Hemoglobin: 10.9 g/dL — ABNORMAL LOW (ref 12.0–15.0)
LYMPHS PCT: 39.9 % (ref 12.0–46.0)
Lymphs Abs: 1.9 10*3/uL (ref 0.7–4.0)
MCHC: 32.6 g/dL (ref 30.0–36.0)
MCV: 84.3 fl (ref 78.0–100.0)
Monocytes Absolute: 0.3 10*3/uL (ref 0.1–1.0)
Monocytes Relative: 6.7 % (ref 3.0–12.0)
NEUTROS ABS: 2.4 10*3/uL (ref 1.4–7.7)
Neutrophils Relative %: 48.7 % (ref 43.0–77.0)
PLATELETS: 345 10*3/uL (ref 150.0–400.0)
RBC: 3.96 Mil/uL (ref 3.87–5.11)
RDW: 15.8 % — AB (ref 11.5–15.5)
WBC: 4.9 10*3/uL (ref 4.0–10.5)

## 2016-08-18 LAB — URINALYSIS, ROUTINE W REFLEX MICROSCOPIC
Bilirubin Urine: NEGATIVE
Hgb urine dipstick: NEGATIVE
Ketones, ur: NEGATIVE
Nitrite: NEGATIVE
PH: 6 (ref 5.0–8.0)
RBC / HPF: NONE SEEN (ref 0–?)
SPECIFIC GRAVITY, URINE: 1.01 (ref 1.000–1.030)
Total Protein, Urine: NEGATIVE
Urine Glucose: NEGATIVE
Urobilinogen, UA: 0.2 (ref 0.0–1.0)

## 2016-08-18 LAB — IBC PANEL
IRON: 52 ug/dL (ref 42–145)
Saturation Ratios: 16.6 % — ABNORMAL LOW (ref 20.0–50.0)
Transferrin: 224 mg/dL (ref 212.0–360.0)

## 2016-08-18 LAB — HEPATIC FUNCTION PANEL
ALK PHOS: 64 U/L (ref 39–117)
ALT: 10 U/L (ref 0–35)
AST: 14 U/L (ref 0–37)
Albumin: 4.1 g/dL (ref 3.5–5.2)
BILIRUBIN TOTAL: 0.2 mg/dL (ref 0.2–1.2)
Bilirubin, Direct: 0 mg/dL (ref 0.0–0.3)
Total Protein: 7.6 g/dL (ref 6.0–8.3)

## 2016-08-18 LAB — URIC ACID: Uric Acid, Serum: 6.3 mg/dL (ref 2.4–7.0)

## 2016-08-18 LAB — BASIC METABOLIC PANEL
BUN: 23 mg/dL (ref 6–23)
CO2: 34 mEq/L — ABNORMAL HIGH (ref 19–32)
CREATININE: 1.53 mg/dL — AB (ref 0.40–1.20)
Calcium: 8.9 mg/dL (ref 8.4–10.5)
Chloride: 99 mEq/L (ref 96–112)
GFR: 44.01 mL/min — AB (ref >60.00)
GLUCOSE: 86 mg/dL (ref 70–99)
Potassium: 2.9 mEq/L — ABNORMAL LOW (ref 3.5–5.1)
Sodium: 140 mEq/L (ref 135–145)

## 2016-08-18 LAB — LIPID PANEL
CHOL/HDL RATIO: 5
CHOLESTEROL: 218 mg/dL — AB (ref 0–200)
HDL: 40 mg/dL (ref >39.00)
LDL CALC: 148 mg/dL — AB (ref 0–99)
NonHDL: 178.44
TRIGLYCERIDES: 153 mg/dL — AB (ref 0.0–149.0)
VLDL: 30.6 mg/dL (ref 0.0–40.0)

## 2016-08-18 LAB — TSH: TSH: 0.8 u[IU]/mL (ref 0.35–4.50)

## 2016-08-18 MED ORDER — ATORVASTATIN CALCIUM 40 MG PO TABS: 40.0000 mg | ORAL_TABLET | Freq: Every day | ORAL | 3 refills | Status: DC

## 2016-08-18 MED ORDER — POTASSIUM CHLORIDE CRYS ER 20 MEQ PO TBCR: 40.0000 meq | EXTENDED_RELEASE_TABLET | Freq: Three times a day (TID) | ORAL | 2 refills | Status: DC

## 2016-08-18 NOTE — Progress Notes (Signed)
Subjective:    Patient ID: Jocelyn Ford, female    DOB: 1951/03/27, 65 y.o.   MRN: [id number]  HPI The state of at least three ongoing medical problems is addressed today, with interval history of each noted here: Hypokalemia: she has leg cramps Renal failure: she has fatigue Dyslipidemia: she denies chest pain.   Past Medical History:  Diagnosis Date  . Acute gouty arthropathy   . Allergic rhinitis, cause unspecified   . Arthritis   . Dizziness and giddiness   . Dysuria   . Esophageal reflux   . Iron deficiency anemia, unspecified   . Nonspecific abnormal electrocardiogram (ECG) (EKG)   . Osteoporosis, unspecified   . Pure hypercholesterolemia   . Routine general medical examination at a health care facility   . Unspecified essential hypertension     Past Surgical History:  Procedure Laterality Date  . ABDOMINAL HYSTERECTOMY    . LEFT HEART CATHETERIZATION WITH CORONARY ANGIOGRAM N/A 01/28/2015   Procedure: LEFT HEART CATHETERIZATION WITH CORONARY ANGIOGRAM;  Surgeon: Candee Furbish, MD;  Location: [facility] CATH LAB;  Service: Cardiovascular;  Laterality: N/A;    Social History   Social History  . Marital status: Divorced    Spouse name: n/a  . Number of children: 1  . Years of education: 59   Occupational History  . Magazine features editor   Social History Main Topics  . Smoking status: Former Research scientist (life sciences)  . Smokeless tobacco: Never Used  . Alcohol use No  . Drug use: No  . Sexual activity: No   Other Topics Concern  . Not on file   Social History Narrative   Lives alone.  Her daughter lives nearby.    Current Outpatient Prescriptions on File Prior to Visit  Medication Sig Dispense Refill  . allopurinol (ZYLOPRIM) 300 MG tablet TAKE 1 TABLET DAILY 90 tablet 1  . amLODipine (NORVASC) 2.5 MG tablet Take 1 tablet (2.5 mg total) by mouth daily. 90 tablet 3  . aspirin EC 81 MG tablet Take 1 tablet (81 mg total) by mouth daily. 90 tablet 3  . atenolol  (TENORMIN) 25 MG tablet TAKE 1 TABLET TWICE A DAY 180 tablet 2  . furosemide (LASIX) 40 MG tablet TAKE 1 TABLET (40 MG TOTAL) BY MOUTH DAILY. 30 tablet 3  . ipratropium (ATROVENT) 0.03 % nasal spray Place 2 sprays into both nostrils 2 (two) times daily. 30 mL 0  . Multiple Vitamin (MULTIVITAMIN) capsule Take 2 capsules by mouth daily.    Marland Kitchen omeprazole (PRILOSEC) 40 MG capsule TAKE 1 CAPSULE DAILY 90 capsule 2  . triamterene-hydrochlorothiazide (MAXZIDE-25) 37.5-25 MG tablet TAKE 1/2 TABLET A DAY 30 tablet 3  . tiZANidine (ZANAFLEX) 2 MG tablet Take 1tab at bedtime for 7 days, then 1 tab twice daily for 7 days, then 1 tab three times daily (Patient not taking: Reported on 02/27/2016) 90 tablet 0   No current facility-administered medications on file prior to visit.     Allergies  Allergen Reactions  . Doxycycline   . Nifedipine     REACTION: Nausea,weakness    Family History  Problem Relation Age of Onset  . Cancer Father     Prostate  . Alzheimer's disease Mother     BP 140/82 (BP Location: Left Arm, Patient Position: Sitting)   Pulse 81   Wt 219 lb (99.3 kg)   SpO2 97%   BMI 33.79 kg/m    Review of Systems She has abd pain and doe.  Objective:   Physical Exam VITAL SIGNS:  See vs page GENERAL: no distress LUNGS:  Clear to auscultation HEART:  Regular rate and rhythm without murmurs noted. Normal S1,S2.     i personally reviewed electrocardiogram tracing (today): Indication: sob Impression:low voltage  i personally reviewed spirometry tracing (today): Indication: sob Impression: normal  Lab Results  Component Value Date   CREATININE 1.53 (H) 08/18/2016   BUN 23 08/18/2016   NA 140 08/18/2016   K 2.9 (L) 08/18/2016   CL 99 08/18/2016   CO2 34 (H) 08/18/2016      Assessment & Plan:  DOE, new: most commonly due to deconditioning Hypokalemia: she needs increased rx Renal insufficiency: we'll follow.  fe-deficiency anemia: she needs increased rx.  Take  fe tabs.  Check hemoccults.

## 2016-08-18 NOTE — Patient Instructions (Addendum)
blood tests and a chest x-ray are requested for you today.  We'll let you know about the results.   Please come back soon for your annual physical.

## 2016-08-19 ENCOUNTER — Telehealth: Payer: Self-pay | Admitting: Endocrinology

## 2016-08-19 LAB — PTH, INTACT AND CALCIUM
CALCIUM: 9.3 mg/dL (ref 8.6–10.4)
PTH: 168 pg/mL — ABNORMAL HIGH (ref 14–64)

## 2016-08-19 NOTE — Telephone Encounter (Signed)
Due to persistent anemia, please send pt hemoccults, and advise pt we are sending

## 2016-08-20 NOTE — Telephone Encounter (Signed)
I contacted the patient and advised of message via voicemail. Hemoccult sent.

## 2016-09-05 ENCOUNTER — Other Ambulatory Visit: Payer: Self-pay | Admitting: Physician Assistant

## 2016-09-05 DIAGNOSIS — R05 Cough: Secondary | ICD-10-CM

## 2016-09-05 DIAGNOSIS — R058 Other specified cough: Secondary | ICD-10-CM

## 2016-10-02 ENCOUNTER — Other Ambulatory Visit: Payer: Self-pay | Admitting: Endocrinology

## 2016-10-15 ENCOUNTER — Other Ambulatory Visit: Payer: Self-pay | Admitting: Endocrinology

## 2016-11-15 ENCOUNTER — Other Ambulatory Visit: Payer: Self-pay | Admitting: Endocrinology

## 2016-11-17 ENCOUNTER — Other Ambulatory Visit: Payer: Self-pay | Admitting: Endocrinology

## 2016-11-26 ENCOUNTER — Other Ambulatory Visit: Payer: Self-pay | Admitting: Endocrinology

## 2016-11-28 NOTE — Progress Notes (Signed)
Subjective:    Patient ID: Jocelyn Ford, female    DOB: 1951-02-19, 65 y.o.   MRN: [id number]  HPI Pt is here for regular wellness examination, and is feeling pretty well in general, and says chronic med probs are stable, except as noted below.  She takes fe 1/day.   Past Medical History:  Diagnosis Date  . Acute gouty arthropathy   . Allergic rhinitis, cause unspecified   . Arthritis   . Dizziness and giddiness   . Dysuria   . Esophageal reflux   . Iron deficiency anemia, unspecified   . Nonspecific abnormal electrocardiogram (ECG) (EKG)   . Osteoporosis, unspecified   . Pure hypercholesterolemia   . Routine general medical examination at a health care facility   . Unspecified essential hypertension     Past Surgical History:  Procedure Laterality Date  . ABDOMINAL HYSTERECTOMY    . LEFT HEART CATHETERIZATION WITH CORONARY ANGIOGRAM N/A 01/28/2015   Procedure: LEFT HEART CATHETERIZATION WITH CORONARY ANGIOGRAM;  Surgeon: Candee Furbish, MD;  Location: [facility] CATH LAB;  Service: Cardiovascular;  Laterality: N/A;    Social History   Social History  . Marital status: Divorced    Spouse name: n/a  . Number of children: 1  . Years of education: 30   Occupational History  . Magazine features editor   Social History Main Topics  . Smoking status: Former Research scientist (life sciences)  . Smokeless tobacco: Never Used  . Alcohol use No  . Drug use: No  . Sexual activity: No   Other Topics Concern  . Not on file   Social History Narrative   Lives alone.  Her daughter lives nearby.    Current Outpatient Prescriptions on File Prior to Visit  Medication Sig Dispense Refill  . allopurinol (ZYLOPRIM) 300 MG tablet TAKE 1 TABLET DAILY 90 tablet 1  . amLODipine (NORVASC) 2.5 MG tablet Take 1 tablet (2.5 mg total) by mouth daily. 90 tablet 3  . aspirin EC 81 MG tablet Take 1 tablet (81 mg total) by mouth daily. 90 tablet 3  . atenolol (TENORMIN) 25 MG tablet TAKE 1 TABLET TWICE A DAY 180  tablet 2  . atorvastatin (LIPITOR) 40 MG tablet Take 1 tablet (40 mg total) by mouth daily. Reported on 03/11/2016 90 tablet 3  . furosemide (LASIX) 40 MG tablet TAKE 1 TABLET (40 MG TOTAL) BY MOUTH DAILY. 30 tablet 3  . ipratropium (ATROVENT) 0.03 % nasal spray Place 2 sprays into both nostrils 2 (two) times daily. 30 mL 0  . KLOR-CON M20 20 MEQ tablet TAKE 2 TABLETS (40 MEQ TOTAL) BY MOUTH 3 (THREE) TIMES DAILY. 180 tablet 2  . Multiple Vitamin (MULTIVITAMIN) capsule Take 2 capsules by mouth daily.    Marland Kitchen omeprazole (PRILOSEC) 40 MG capsule TAKE 1 CAPSULE DAILY 90 capsule 2  . tiZANidine (ZANAFLEX) 2 MG tablet Take 1tab at bedtime for 7 days, then 1 tab twice daily for 7 days, then 1 tab three times daily 90 tablet 0  . triamterene-hydrochlorothiazide (MAXZIDE-25) 37.5-25 MG tablet TAKE 1/2 TABLET BY MOUTH ONCE A DAY 30 tablet 3   No current facility-administered medications on file prior to visit.     Allergies  Allergen Reactions  . Doxycycline   . Nifedipine     REACTION: Nausea,weakness    Family History  Problem Relation Age of Onset  . Cancer Father     Prostate  . Alzheimer's disease Mother     BP 132/86   Pulse 77  Ht 5\' 7"  (1.702 m)   Wt 219 lb (99.3 kg)   SpO2 96%   BMI 34.30 kg/m   Review of Systems Constitutional: Negative for fever.  She has gained weight.  HENT: Negative for hearing loss.   Eyes: Negative for visual disturbance.  Respiratory: Negative for shortness of breath.   Cardiovascular: Negative for chest pain.  Gastrointestinal: Negative for anal bleeding.  Endocrine: Negative for cold intolerance.  Genitourinary: Negative for hematuria.  Musculoskeletal: Negative for back pain.  Skin: Negative for rash.  Allergic/Immunologic: Positive for environmental allergies.  Neurological: Negative for syncope and numbness.  Hematological: Does not bruise/bleed easily.  Psychiatric/Behavioral: Negative for dysphoric mood.      Objective:   Physical  Exam VS: see vs page GEN: no distress HEAD: head: no deformity eyes: no periorbital swelling, no proptosis external nose and ears are normal mouth: no lesion seen NECK: supple, thyroid is not enlarged CHEST WALL: no deformity LUNGS:  Clear to auscultation BREASTS: sees gyn CV: reg rate and rhythm, no murmur ABD: abdomen is soft, nontender.  no hepatosplenomegaly.  not distended.  no hernia GENITALIA/RECTAL: sees gyn MUSCULOSKELETAL: muscle bulk and strength are grossly normal.  no obvious joint swelling.  gait is normal and steady.   EXTEMITIES: no deformity.  no ulcer on the feet.  feet are of normal color and temp.  Trace bilat leg edema.  There is bilateral onychomycosis of the toenails PULSES: dorsalis pedis intact bilat.  no carotid bruit NEURO:  cn 2-12 grossly intact.   readily moves all 4's.  sensation is intact to touch on the feet SKIN:  Normal texture and temperature.  No rash or suspicious lesion is visible.   NODES:  None palpable at the neck PSYCH: alert, well-oriented.  Does not appear anxious nor depressed.      Assessment & Plan:  Wellness visit today, with problems stable, except as noted. Patient is advised the following: Patient Instructions  blood tests are requested for you today.  We'll let you know about the results. Here are some tests for blood in the bowels.  please follow the instructions, and return to the lab. Please consider these measures for your health:  minimize alcohol.  Do not use tobacco products.  Have a colonoscopy at least every 10 years from age 81.  Women should have an annual mammogram from age 76.  Keep firearms safely stored.  Always use seat belts.  have working smoke alarms in your home.  See an eye doctor and dentist regularly.  Never drive under the influence of alcohol or drugs (including prescription drugs).   It is critically important to prevent falling down (keep floor areas well-lit, dry, and free of loose objects.  If you have a  cane, walker, or wheelchair, you should use it, even for short trips around the house.  Wear flat-soled shoes.  Also, try not to rush). Please return in 1 year.

## 2016-11-29 ENCOUNTER — Ambulatory Visit (INDEPENDENT_AMBULATORY_CARE_PROVIDER_SITE_OTHER): Payer: BLUE CROSS/BLUE SHIELD | Admitting: Endocrinology

## 2016-11-29 ENCOUNTER — Encounter: Payer: Self-pay | Admitting: Endocrinology

## 2016-11-29 VITALS — BP 132/86 | HR 77 | Ht 67.0 in | Wt 219.0 lb

## 2016-11-29 DIAGNOSIS — M810 Age-related osteoporosis without current pathological fracture: Secondary | ICD-10-CM

## 2016-11-29 DIAGNOSIS — D509 Iron deficiency anemia, unspecified: Secondary | ICD-10-CM | POA: Diagnosis not present

## 2016-11-29 DIAGNOSIS — E213 Hyperparathyroidism, unspecified: Secondary | ICD-10-CM | POA: Diagnosis not present

## 2016-11-29 DIAGNOSIS — Z0000 Encounter for general adult medical examination without abnormal findings: Secondary | ICD-10-CM | POA: Diagnosis not present

## 2016-11-29 NOTE — Patient Instructions (Addendum)
blood tests are requested for you today.  We'll let you know about the results. Here are some tests for blood in the bowels.  please follow the instructions, and return to the lab. Please consider these measures for your health:  minimize alcohol.  Do not use tobacco products.  Have a colonoscopy at least every 10 years from age 63.  Women should have an annual mammogram from age 72.  Keep firearms safely stored.  Always use seat belts.  have working smoke alarms in your home.  See an eye doctor and dentist regularly.  Never drive under the influence of alcohol or drugs (including prescription drugs).   It is critically important to prevent falling down (keep floor areas well-lit, dry, and free of loose objects.  If you have a cane, walker, or wheelchair, you should use it, even for short trips around the house.  Wear flat-soled shoes.  Also, try not to rush). Please return in 1 year.

## 2016-11-29 NOTE — Progress Notes (Signed)
we discussed code status.  pt requests full code, but would not want to be started or maintained on artificial life-support measures if there was not a reasonable chance of recovery 

## 2016-11-30 ENCOUNTER — Other Ambulatory Visit: Payer: Self-pay | Admitting: Endocrinology

## 2016-11-30 DIAGNOSIS — E876 Hypokalemia: Secondary | ICD-10-CM

## 2016-11-30 DIAGNOSIS — E7800 Pure hypercholesterolemia, unspecified: Secondary | ICD-10-CM

## 2016-11-30 DIAGNOSIS — D509 Iron deficiency anemia, unspecified: Secondary | ICD-10-CM

## 2016-11-30 DIAGNOSIS — E213 Hyperparathyroidism, unspecified: Secondary | ICD-10-CM

## 2016-11-30 LAB — CBC WITH DIFFERENTIAL/PLATELET
BASOS PCT: 0.3 % (ref 0.0–3.0)
Basophils Absolute: 0 10*3/uL (ref 0.0–0.1)
EOS PCT: 6.4 % — AB (ref 0.0–5.0)
Eosinophils Absolute: 0.3 10*3/uL (ref 0.0–0.7)
HEMATOCRIT: 36.2 % (ref 36.0–46.0)
HEMOGLOBIN: 11.8 g/dL — AB (ref 12.0–15.0)
LYMPHS PCT: 46.5 % — AB (ref 12.0–46.0)
Lymphs Abs: 2.4 10*3/uL (ref 0.7–4.0)
MCHC: 32.6 g/dL (ref 30.0–36.0)
MCV: 84.7 fl (ref 78.0–100.0)
Monocytes Absolute: 0.4 10*3/uL (ref 0.1–1.0)
Monocytes Relative: 7.1 % (ref 3.0–12.0)
Neutro Abs: 2.1 10*3/uL (ref 1.4–7.7)
Neutrophils Relative %: 39.7 % — ABNORMAL LOW (ref 43.0–77.0)
Platelets: 362 10*3/uL (ref 150.0–400.0)
RBC: 4.27 Mil/uL (ref 3.87–5.11)
RDW: 14.8 % (ref 11.5–15.5)
WBC: 5.2 10*3/uL (ref 4.0–10.5)

## 2016-11-30 LAB — LIPID PANEL
CHOLESTEROL: 222 mg/dL — AB (ref 0–200)
HDL: 40.5 mg/dL (ref >39.00)
LDL Cholesterol: 147 mg/dL — ABNORMAL HIGH (ref 0–99)
NonHDL: 181.18
TRIGLYCERIDES: 170 mg/dL — AB (ref 0.0–149.0)
Total CHOL/HDL Ratio: 5
VLDL: 34 mg/dL (ref 0.0–40.0)

## 2016-11-30 LAB — BASIC METABOLIC PANEL
BUN: 18 mg/dL (ref 6–23)
CALCIUM: 9.5 mg/dL (ref 8.4–10.5)
CHLORIDE: 100 meq/L (ref 96–112)
CO2: 30 mEq/L (ref 19–32)
CREATININE: 1.36 mg/dL — AB (ref 0.40–1.20)
GFR: 50.37 mL/min — ABNORMAL LOW (ref >60.00)
Glucose, Bld: 85 mg/dL (ref 70–99)
Potassium: 3.3 mEq/L — ABNORMAL LOW (ref 3.5–5.1)
SODIUM: 140 meq/L (ref 135–145)

## 2016-11-30 LAB — IBC PANEL
Iron: 36 ug/dL — ABNORMAL LOW (ref 42–145)
Saturation Ratios: 10.6 % — ABNORMAL LOW (ref 20.0–50.0)
Transferrin: 242 mg/dL (ref 212.0–360.0)

## 2016-11-30 LAB — PTH, INTACT AND CALCIUM
CALCIUM: 9.6 mg/dL (ref 8.6–10.4)
PTH: 167 pg/mL — ABNORMAL HIGH (ref 14–64)

## 2016-11-30 LAB — VITAMIN D 25 HYDROXY (VIT D DEFICIENCY, FRACTURES): VITD: 19.67 ng/mL — AB (ref 30.00–100.00)

## 2016-11-30 LAB — HEPATITIS C ANTIBODY: HCV Ab: NEGATIVE

## 2016-11-30 LAB — HIV ANTIBODY (ROUTINE TESTING W REFLEX): HIV: NONREACTIVE

## 2016-11-30 MED ORDER — VITAMIN D (ERGOCALCIFEROL) 1.25 MG (50000 UNIT) PO CAPS: 50000.0000 [IU] | ORAL_CAPSULE | ORAL | 0 refills | Status: AC

## 2016-11-30 MED ORDER — ATORVASTATIN CALCIUM 80 MG PO TABS: 80.0000 mg | ORAL_TABLET | Freq: Every day | ORAL | 11 refills | Status: DC

## 2016-11-30 MED ORDER — POTASSIUM CHLORIDE CRYS ER 20 MEQ PO TBCR: 60.0000 meq | EXTENDED_RELEASE_TABLET | Freq: Three times a day (TID) | ORAL | 11 refills | Status: DC

## 2016-12-11 ENCOUNTER — Other Ambulatory Visit: Payer: Self-pay | Admitting: Endocrinology

## 2016-12-11 DIAGNOSIS — Z780 Asymptomatic menopausal state: Secondary | ICD-10-CM | POA: Insufficient documentation

## 2016-12-22 ENCOUNTER — Ambulatory Visit (INDEPENDENT_AMBULATORY_CARE_PROVIDER_SITE_OTHER): Payer: BLUE CROSS/BLUE SHIELD | Admitting: Physician Assistant

## 2016-12-22 VITALS — BP 130/80 | HR 89 | Temp 98.7°F | Resp 17 | Ht 67.0 in | Wt 220.0 lb

## 2016-12-22 DIAGNOSIS — J22 Unspecified acute lower respiratory infection: Secondary | ICD-10-CM

## 2016-12-22 MED ORDER — BENZONATATE 100 MG PO CAPS: 100.0000 mg | ORAL_CAPSULE | Freq: Three times a day (TID) | ORAL | 0 refills | Status: DC | PRN

## 2016-12-22 MED ORDER — AMOXICILLIN-POT CLAVULANATE 875-125 MG PO TABS: 1.0000 | ORAL_TABLET | Freq: Two times a day (BID) | ORAL | 0 refills | Status: AC

## 2016-12-22 MED ORDER — GUAIFENESIN ER 1200 MG PO TB12: 1.0000 | ORAL_TABLET | Freq: Two times a day (BID) | ORAL | 1 refills | Status: DC | PRN

## 2016-12-22 NOTE — Progress Notes (Signed)
[facility] [address], [place] [address] [phone]  Date:  12/22/2016   Name:  Jocelyn Ford   DOB:  08-03-1951   MRN:  [id number]  PCP:  Renato Shin, MD    History of Present Illness:  Jocelyn Ford is a 66 y.o. female patient who presents to [facility] for cc of sore throat, congestion, and cough.  --sore throat, coughing up green phlegm for 4 days.  Cough is worse at night.  She feels chest tightness.  No fever.  She took excedrin this morning recently.  There is nasal congestion.  Work has sick contacts, with flu and pneumonia.  She takes corticetin which helps.    Patient Active Problem List   Diagnosis Date Noted  . Asymptomatic menopausal state 12/11/2016  . Hyperparathyroidism ([place]) 11/29/2016  . Dyspnea 08/18/2016  . Chronic tension-type headache, not intractable 01/30/2016  . Dizziness and giddiness 01/07/2016  . Headache 01/07/2016  . Fatigue 01/07/2016  . Renal insufficiency 08/20/2015  . Abnormal stress test   . Chest pain 09/27/2014  . Menopausal state 09/27/2014  . Gout 08/08/2014  . Routine general medical examination at a health care facility 10/03/2013  . Hypokalemia 10/03/2013  . Encounter for long-term (current) use of other medications 08/21/2013  . ACUTE GOUTY ARTHROPATHY 07/18/2009  . Iron deficiency anemia 11/07/2008  . HYPERCHOLESTEROLEMIA 11/01/2008  . ELECTROCARDIOGRAM, ABNORMAL 11/01/2008  . Essential hypertension 07/28/2007  . ALLERGIC RHINITIS 07/28/2007  . GERD 07/28/2007  . Osteoporosis 07/28/2007    Past Medical History:  Diagnosis Date  . Acute gouty arthropathy   . Allergic rhinitis, cause unspecified   . Arthritis   . Dizziness and giddiness   . Dysuria   . Esophageal reflux   . Iron deficiency anemia, unspecified   . Nonspecific abnormal electrocardiogram (ECG) (EKG)   . Osteoporosis, unspecified   . Pure hypercholesterolemia   . Routine general medical examination at a health care facility    . Unspecified essential hypertension     Past Surgical History:  Procedure Laterality Date  . ABDOMINAL HYSTERECTOMY    . LEFT HEART CATHETERIZATION WITH CORONARY ANGIOGRAM N/A 01/28/2015   Procedure: LEFT HEART CATHETERIZATION WITH CORONARY ANGIOGRAM;  Surgeon: Candee Furbish, MD;  Location: [facility] CATH LAB;  Service: Cardiovascular;  Laterality: N/A;    Social History  Substance Use Topics  . Smoking status: Former Research scientist (life sciences)  . Smokeless tobacco: Never Used  . Alcohol use No    Family History  Problem Relation Age of Onset  . Cancer Father     Prostate  . Alzheimer's disease Mother     Allergies  Allergen Reactions  . Doxycycline   . Nifedipine     REACTION: Nausea,weakness    Medication list has been reviewed and updated.  Current Outpatient Prescriptions on File Prior to Visit  Medication Sig Dispense Refill  . allopurinol (ZYLOPRIM) 300 MG tablet TAKE 1 TABLET DAILY 90 tablet 1  . amLODipine (NORVASC) 2.5 MG tablet Take 1 tablet (2.5 mg total) by mouth daily. 90 tablet 3  . aspirin EC 81 MG tablet Take 1 tablet (81 mg total) by mouth daily. 90 tablet 3  . atenolol (TENORMIN) 25 MG tablet TAKE 1 TABLET TWICE A DAY 180 tablet 2  . atorvastatin (LIPITOR) 80 MG tablet Take 1 tablet (80 mg total) by mouth daily. 30 tablet 11  . furosemide (LASIX) 40 MG tablet TAKE 1 TABLET (40 MG TOTAL) BY MOUTH DAILY. 30 tablet 3  .  ipratropium (ATROVENT) 0.03 % nasal spray Place 2 sprays into both nostrils 2 (two) times daily. 30 mL 0  . Multiple Vitamin (MULTIVITAMIN) capsule Take 2 capsules by mouth daily.    Marland Kitchen omeprazole (PRILOSEC) 40 MG capsule TAKE 1 CAPSULE DAILY 90 capsule 2  . potassium chloride SA (KLOR-CON M20) 20 MEQ tablet Take 3 tablets (60 mEq total) by mouth 3 (three) times daily. 270 tablet 11  . tiZANidine (ZANAFLEX) 2 MG tablet Take 1tab at bedtime for 7 days, then 1 tab twice daily for 7 days, then 1 tab three times daily 90 tablet 0  . triamterene-hydrochlorothiazide  (MAXZIDE-25) 37.5-25 MG tablet TAKE 1/2 TABLET BY MOUTH ONCE A DAY 30 tablet 3  . Vitamin D, Ergocalciferol, (DRISDOL) 50000 units CAPS capsule Take 1 capsule (50,000 Units total) by mouth 3 (three) times a week. 12 capsule 0   No current facility-administered medications on file prior to visit.     ROS ROS otherwise unremarkable unless listed above.   Physical Examination: BP 130/80 (BP Location: Right Arm, Patient Position: Sitting, Cuff Size: Normal)   Pulse 89   Temp 98.7 F (37.1 C) (Oral)   Resp 17   Ht 5\' 7"  (1.702 m)   Wt 220 lb (99.8 kg)   SpO2 97%   BMI 34.46 kg/m  Ideal Body Weight: Weight in (lb) to have BMI = 25: 159.3  Physical Exam  Constitutional: She is oriented to person, place, and time. She appears well-developed and well-nourished. No distress.  HENT:  Head: Normocephalic and atraumatic.  Right Ear: Tympanic membrane, external ear and ear canal normal.  Left Ear: Tympanic membrane, external ear and ear canal normal.  Nose: Mucosal edema and rhinorrhea present. Right sinus exhibits no maxillary sinus tenderness and no frontal sinus tenderness. Left sinus exhibits no maxillary sinus tenderness and no frontal sinus tenderness.  Mouth/Throat: No uvula swelling. No oropharyngeal exudate, posterior oropharyngeal edema or posterior oropharyngeal erythema.  Eyes: Conjunctivae and EOM are normal. Pupils are equal, round, and reactive to light.  Cardiovascular: Normal rate and regular rhythm.  Exam reveals no gallop, no distant heart sounds and no friction rub.   No murmur heard. Pulmonary/Chest: Effort normal. No apnea. No respiratory distress. She has no decreased breath sounds. She has no wheezes. She has rhonchi.  Lymphadenopathy:       Head (right side): No submandibular, no tonsillar, no preauricular and no posterior auricular adenopathy present.       Head (left side): No submandibular, no tonsillar, no preauricular and no posterior auricular adenopathy present.   Neurological: She is alert and oriented to person, place, and time.  Skin: She is not diaphoretic.  Psychiatric: She has a normal mood and affect. Her behavior is normal.     Assessment and Plan: GEORGA ROUTLEDGE is a 66 y.o. female who is here today for cc of cough and congestion.   --will treat supportively at this time.  Given printed prescription of the augmentin.    Lower respiratory infection (e.g., bronchitis, pneumonia, pneumonitis, pulmonitis) - Plan: Guaifenesin (MUCINEX MAXIMUM STRENGTH) 1200 MG TB12, amoxicillin-clavulanate (AUGMENTIN) 875-125 MG tablet, benzonatate (TESSALON) 100 MG capsule  Ivar Drape, PA-C [facility] 1/3/20188:41 AM

## 2016-12-22 NOTE — Patient Instructions (Addendum)
Please hydrate well with 64 oz of water at this time Take medication as prescribed.   If your symptoms do not improve or worsen, please take antibiotic, and to completion.      IF you received an x-ray today, you will receive an invoice from [facility]. Please contact [facility] at [phone] with questions or concerns regarding your invoice.   IF you received labwork today, you will receive an invoice from [facility]. Please contact [facility] at [phone] with questions or concerns regarding your invoice.   Our billing staff will not be able to assist you with questions regarding bills from these companies.  You will be contacted with the lab results as soon as they are available. The fastest way to get your results is to activate your My Chart account. Instructions are located on the last page of this paperwork. If you have not heard from [place] regarding the results in 2 weeks, please contact this office.

## 2016-12-31 ENCOUNTER — Other Ambulatory Visit: Payer: Self-pay | Admitting: Endocrinology

## 2017-02-15 ENCOUNTER — Ambulatory Visit (INDEPENDENT_AMBULATORY_CARE_PROVIDER_SITE_OTHER): Payer: BLUE CROSS/BLUE SHIELD | Admitting: Endocrinology

## 2017-02-15 ENCOUNTER — Other Ambulatory Visit: Payer: Self-pay | Admitting: Endocrinology

## 2017-02-15 ENCOUNTER — Encounter: Payer: Self-pay | Admitting: Endocrinology

## 2017-02-15 VITALS — BP 126/78 | HR 82 | Ht 67.0 in | Wt 221.0 lb

## 2017-02-15 DIAGNOSIS — M810 Age-related osteoporosis without current pathological fracture: Secondary | ICD-10-CM | POA: Diagnosis not present

## 2017-02-15 DIAGNOSIS — N289 Disorder of kidney and ureter, unspecified: Secondary | ICD-10-CM | POA: Diagnosis not present

## 2017-02-15 DIAGNOSIS — R519 Headache, unspecified: Secondary | ICD-10-CM

## 2017-02-15 DIAGNOSIS — E7800 Pure hypercholesterolemia, unspecified: Secondary | ICD-10-CM

## 2017-02-15 DIAGNOSIS — D509 Iron deficiency anemia, unspecified: Secondary | ICD-10-CM | POA: Diagnosis not present

## 2017-02-15 DIAGNOSIS — R51 Headache: Secondary | ICD-10-CM | POA: Diagnosis not present

## 2017-02-15 LAB — BASIC METABOLIC PANEL
BUN: 20 mg/dL (ref 6–23)
CO2: 28 mEq/L (ref 19–32)
CREATININE: 1.35 mg/dL — AB (ref 0.40–1.20)
Calcium: 9.9 mg/dL (ref 8.4–10.5)
Chloride: 102 mEq/L (ref 96–112)
GFR: 50.77 mL/min — AB (ref >60.00)
Glucose, Bld: 101 mg/dL — ABNORMAL HIGH (ref 70–99)
POTASSIUM: 3.5 meq/L (ref 3.5–5.1)
Sodium: 141 mEq/L (ref 135–145)

## 2017-02-15 LAB — VITAMIN D 25 HYDROXY (VIT D DEFICIENCY, FRACTURES): VITD: 30.74 ng/mL (ref 30.00–100.00)

## 2017-02-15 LAB — LIPID PANEL
Cholesterol: 227 mg/dL — ABNORMAL HIGH (ref 0–200)
HDL: 40.5 mg/dL (ref >39.00)
LDL Cholesterol: 156 mg/dL — ABNORMAL HIGH (ref 0–99)
NONHDL: 186.52
TRIGLYCERIDES: 155 mg/dL — AB (ref 0.0–149.0)
Total CHOL/HDL Ratio: 6
VLDL: 31 mg/dL (ref 0.0–40.0)

## 2017-02-15 LAB — CBC WITH DIFFERENTIAL/PLATELET
BASOS PCT: 0.6 % (ref 0.0–3.0)
Basophils Absolute: 0 10*3/uL (ref 0.0–0.1)
EOS PCT: 6.3 % — AB (ref 0.0–5.0)
Eosinophils Absolute: 0.3 10*3/uL (ref 0.0–0.7)
HCT: 35.1 % — ABNORMAL LOW (ref 36.0–46.0)
HEMOGLOBIN: 11.5 g/dL — AB (ref 12.0–15.0)
LYMPHS ABS: 1.6 10*3/uL (ref 0.7–4.0)
Lymphocytes Relative: 37.1 % (ref 12.0–46.0)
MCHC: 32.8 g/dL (ref 30.0–36.0)
MCV: 85.5 fl (ref 78.0–100.0)
MONO ABS: 0.4 10*3/uL (ref 0.1–1.0)
Monocytes Relative: 8.7 % (ref 3.0–12.0)
NEUTROS ABS: 2 10*3/uL (ref 1.4–7.7)
Neutrophils Relative %: 47.3 % (ref 43.0–77.0)
Platelets: 397 10*3/uL (ref 150.0–400.0)
RBC: 4.11 Mil/uL (ref 3.87–5.11)
RDW: 14.7 % (ref 11.5–15.5)
WBC: 4.2 10*3/uL (ref 4.0–10.5)

## 2017-02-15 LAB — SEDIMENTATION RATE: Sed Rate: 85 mm/hr — ABNORMAL HIGH (ref 0–30)

## 2017-02-15 LAB — IBC PANEL
IRON: 46 ug/dL (ref 42–145)
Saturation Ratios: 14.3 % — ABNORMAL LOW (ref 20.0–50.0)
Transferrin: 229 mg/dL (ref 212.0–360.0)

## 2017-02-15 MED ORDER — ROSUVASTATIN CALCIUM 10 MG PO TABS: 10.0000 mg | ORAL_TABLET | Freq: Every day | ORAL | 3 refills | Status: DC

## 2017-02-15 NOTE — Progress Notes (Signed)
Subjective:    Patient ID: Jocelyn Ford, female    DOB: [birth year], 66 y.o.   MRN: [id number]  HPI  Pt states 1 month of intermittent mild headache, worst at the left parietal area.  She has assoc twitching of the left eyelid.  She also has intermittent left facial numbness. Past Medical History:  Diagnosis Date  . Acute gouty arthropathy   . Allergic rhinitis, cause unspecified   . Arthritis   . Dizziness and giddiness   . Dysuria   . Esophageal reflux   . Iron deficiency anemia, unspecified   . Nonspecific abnormal electrocardiogram (ECG) (EKG)   . Osteoporosis, unspecified   . Pure hypercholesterolemia   . Routine general medical examination at a health care facility   . Unspecified essential hypertension     Past Surgical History:  Procedure Laterality Date  . ABDOMINAL HYSTERECTOMY    . LEFT HEART CATHETERIZATION WITH CORONARY ANGIOGRAM N/A 01/28/2015   Procedure: LEFT HEART CATHETERIZATION WITH CORONARY ANGIOGRAM;  Surgeon: Candee Furbish, MD;  [facility] C[facility] LAB;  Service: Cardiovascular;  Laterality: N/A;    Social History   Social History  . Marital status: Divorced    Spouse name: n/a  . Number of children: 1  . Years of education: 59   Occupational History  . Magazine features editor   Social History Main Topics  . Smoking status: Former Research scientist (life sciences)  . Smokeless tobacco: Never Used  . Alcohol use No  . Drug use: No  . Sexual activity: No   Other Topics Concern  . Not on file   Social History Narrative   Lives alone.  Her daughter lives nearby.    Current Outpatient Prescriptions on File Prior to Visit  Medication Sig Dispense Refill  . allopurinol (ZYLOPRIM) 300 MG tablet TAKE 1 TABLET DAILY 90 tablet 1  . amLODipine (NORVASC) 2.5 MG tablet Take 1 tablet (2.5 mg total) by mouth daily. 90 tablet 3  . aspirin EC 81 MG tablet Take 1 tablet (81 mg total) by mouth daily. 90 tablet 3  . atenolol (TENORMIN) 25 MG tablet TAKE 1 TABLET TWICE A  DAY 180 tablet 2  . furosemide (LASIX) 40 MG tablet TAKE 1 TABLET (40 MG TOTAL) BY MOUTH DAILY. 30 tablet 3  . ipratropium (ATROVENT) 0.03 % nasal spray Place 2 sprays into both nostrils 2 (two) times daily. 30 mL 0  . Multiple Vitamin (MULTIVITAMIN) capsule Take 2 capsules by mouth daily.    Marland Kitchen omeprazole (PRILOSEC) 40 MG capsule TAKE 1 CAPSULE DAILY 90 capsule 2  . potassium chloride SA (KLOR-CON M20) 20 MEQ tablet Take 3 tablets (60 mEq total) by mouth 3 (three) times daily. 270 tablet 11  . tiZANidine (ZANAFLEX) 2 MG tablet Take 1tab at bedtime for 7 days, then 1 tab twice daily for 7 days, then 1 tab three times daily 90 tablet 0  . triamterene-hydrochlorothiazide (MAXZIDE-25) 37.5-25 MG tablet TAKE 1/2 TABLET BY MOUTH ONCE A DAY 30 tablet 3  . Guaifenesin (MUCINEX MAXIMUM STRENGTH) 1200 MG TB12 Take 1 tablet (1,200 mg total) by mouth every 12 (twelve) hours as needed. (Patient not taking: Reported on 02/15/2017) 14 tablet 1   No current facility-administered medications on file prior to visit.     Allergies  Allergen Reactions  . Doxycycline   . Nifedipine     REACTION: Nausea,weakness    Family History  Problem Relation Age of Onset  . Cancer Father     Prostate  .  Alzheimer's disease Mother     BP 126/78   Pulse 82   Ht _0  (1.702 m)   Wt 221 lb (100.2 kg)   SpO2 96%   BMI 34.61 kg/m   Review of Systems Denies muscle cramps and earache.     Objective:   Physical Exam VITAL SIGNS:  See vs page GENERAL: no distress. head: no deformity  eyes: no periorbital swelling, no proptosis  external nose and ears are normal.  mouth: no lesion seen CN 2-12 intact bilaterally Both eac's and tm's are normal.  Gait: normal and steady.      Assessment & Plan:  Headache, new, uncertain etiology.  Check esr.  Ref neurol.  fe-deficiency anemia: due for recheck. Dyslipidemia: due for recheck Hypokalemia: due for recheck.  Patient is advised the following: Patient  Instructions  Please see a neurology specialist.  you will receive a phone call, about a day and time for an appointment. blood tests are requested for you today.  We'll let you know about the results.

## 2017-02-15 NOTE — Patient Instructions (Signed)
Please see a neurology specialist.  you will receive a phone call, about a day and time for an appointment. blood tests are requested for you today.  We'll let you know about the results.

## 2017-02-16 ENCOUNTER — Ambulatory Visit: Payer: BLUE CROSS/BLUE SHIELD | Admitting: Endocrinology

## 2017-02-16 LAB — PTH, INTACT AND CALCIUM
Calcium: 10 mg/dL (ref 8.6–10.4)
PTH: 85 pg/mL — AB (ref 14–64)

## 2017-02-21 ENCOUNTER — Other Ambulatory Visit: Payer: Self-pay | Admitting: Endocrinology

## 2017-03-25 ENCOUNTER — Other Ambulatory Visit: Payer: Self-pay | Admitting: Endocrinology

## 2017-03-25 ENCOUNTER — Telehealth: Payer: Self-pay | Admitting: Endocrinology

## 2017-03-25 NOTE — Telephone Encounter (Signed)
Pt called in stating that the pharmacy advised pt that she has not more refills for her furosemide (LASIX) 40 MG tablet.  Pt needs a refill please sent [facility] on [address].

## 2017-03-28 MED ORDER — FUROSEMIDE 40 MG PO TABS: 40.0000 mg | ORAL_TABLET | Freq: Every day | ORAL | 3 refills | Status: DC

## 2017-03-28 NOTE — Telephone Encounter (Signed)
Refill submitted. 

## 2017-04-04 ENCOUNTER — Encounter: Payer: Self-pay | Admitting: Neurology

## 2017-04-04 ENCOUNTER — Ambulatory Visit (INDEPENDENT_AMBULATORY_CARE_PROVIDER_SITE_OTHER): Payer: BLUE CROSS/BLUE SHIELD | Admitting: Neurology

## 2017-04-04 VITALS — BP 140/84 | HR 76 | Ht 67.0 in | Wt 222.4 lb

## 2017-04-04 DIAGNOSIS — R29810 Facial weakness: Secondary | ICD-10-CM

## 2017-04-04 DIAGNOSIS — F419 Anxiety disorder, unspecified: Secondary | ICD-10-CM | POA: Diagnosis not present

## 2017-04-04 DIAGNOSIS — R519 Headache, unspecified: Secondary | ICD-10-CM

## 2017-04-04 DIAGNOSIS — R200 Anesthesia of skin: Secondary | ICD-10-CM

## 2017-04-04 DIAGNOSIS — F329 Major depressive disorder, single episode, unspecified: Secondary | ICD-10-CM | POA: Diagnosis not present

## 2017-04-04 DIAGNOSIS — R51 Headache: Secondary | ICD-10-CM | POA: Diagnosis not present

## 2017-04-04 MED ORDER — NORTRIPTYLINE HCL 25 MG PO CAPS: 25.0000 mg | ORAL_CAPSULE | Freq: Every day | ORAL | 3 refills | Status: DC

## 2017-04-04 NOTE — Progress Notes (Signed)
NEUROLOGY FOLLOW UP OFFICE NOTE  Jocelyn Ford [id number]  HISTORY OF PRESENT ILLNESS: Jocelyn Ford is a 66 year old right-handed female with hypercholesterolemia, chronic fatigue, and iron-deficiency anemia who follows up for headache as well as episode of left facial numbness/weakness  UPDATE: She has history of chronic tension type headaches.  She continues to have her habitual headache, 8/10 intensity, lasting 5 minutes but occurring intermittently for 1 to 2 days with headache free days anywhere from a week to a month in between episodes.  She takes Excedrin daily, which she also takes for daily "sinus" headache.  About a month ago, she began experiencing intermittent left facial numbness with twitching of the left eyelid.  It would occur off and on for about 3 weeks and then resolved.  It occurred with headache but also independently from the headache.  She noticed that her left eye and left side of her mouth looked slightly drooped during the episodes.  She has a remote history of left Bell's palsy from over 20 years ago.  Sed rate from 02/15/17 was 85.    She does endorse depression and anxiety.  HISTORY: In 2016, she started having headache.  It is frontal/top and back of head.  It is a non-throbbing 6/10 ache.  It occurs off and on daily but lasts about 30 minutes at a time with Excedrin.  There is no associated nausea, photophobia, phonophobia or visual disturbance.  She notes some neck tightness.  She takes Excedrin daily.  She denies any triggers.  Excedrin helps.  She denies prior history of headaches, except for "sinus headaches".  Very rarely, she has a sharp pain in the left parietal region, 8/10 and lasting about 1 to 2 hours.    PAST MEDICAL HISTORY: Past Medical History:  Diagnosis Date  . Acute gouty arthropathy   . Allergic rhinitis, cause unspecified   . Arthritis   . Dizziness and giddiness   . Dysuria   . Esophageal reflux   . Iron deficiency anemia,  unspecified   . Nonspecific abnormal electrocardiogram (ECG) (EKG)   . Osteoporosis, unspecified   . Pure hypercholesterolemia   . Routine general medical examination at a health care facility   . Unspecified essential hypertension     MEDICATIONS: Current Outpatient Prescriptions on File Prior to Visit  Medication Sig Dispense Refill  . allopurinol (ZYLOPRIM) 300 MG tablet TAKE 1 TABLET DAILY 90 tablet 1  . amLODipine (NORVASC) 2.5 MG tablet TAKE 1 TABLET (2.5 MG TOTAL) BY MOUTH DAILY. 90 tablet 2  . aspirin EC 81 MG tablet Take 1 tablet (81 mg total) by mouth daily. 90 tablet 3  . atenolol (TENORMIN) 25 MG tablet TAKE 1 TABLET TWICE A DAY 180 tablet 2  . furosemide (LASIX) 40 MG tablet Take 1 tablet (40 mg total) by mouth daily. 30 tablet 3  . Guaifenesin (MUCINEX MAXIMUM STRENGTH) 1200 MG TB12 Take 1 tablet (1,200 mg total) by mouth every 12 (twelve) hours as needed. 14 tablet 1  . ipratropium (ATROVENT) 0.03 % nasal spray Place 2 sprays into both nostrils 2 (two) times daily. 30 mL 0  . Multiple Vitamin (MULTIVITAMIN) capsule Take 2 capsules by mouth daily.    Marland Kitchen omeprazole (PRILOSEC) 40 MG capsule TAKE 1 CAPSULE DAILY 90 capsule 2  . potassium chloride SA (KLOR-CON M20) 20 MEQ tablet Take 3 tablets (60 mEq total) by mouth 3 (three) times daily. 270 tablet 11  . rosuvastatin (CRESTOR) 10 MG tablet Take 1 tablet (  10 mg total) by mouth daily. 90 tablet 3  . tiZANidine (ZANAFLEX) 2 MG tablet Take 1tab at bedtime for 7 days, then 1 tab twice daily for 7 days, then 1 tab three times daily 90 tablet 0  . triamterene-hydrochlorothiazide (MAXZIDE-25) 37.5-25 MG tablet TAKE 1/2 TABLET BY MOUTH ONCE A DAY 30 tablet 3   No current facility-administered medications on file prior to visit.     ALLERGIES: Allergies  Allergen Reactions  . Doxycycline   . Nifedipine     REACTION: Nausea,weakness    FAMILY HISTORY: Family History  Problem Relation Age of Onset  . Cancer Father     Prostate    . Alzheimer's disease Mother     SOCIAL HISTORY: Social History   Social History  . Marital status: Divorced    Spouse name: n/a  . Number of children: 1  . Years of education: 17   Occupational History  . Magazine features editor   Social History Main Topics  . Smoking status: Former Research scientist (life sciences)  . Smokeless tobacco: Never Used  . Alcohol use No  . Drug use: No  . Sexual activity: No   Other Topics Concern  . Not on file   Social History Narrative   Lives alone.  Her daughter lives nearby.    REVIEW OF SYSTEMS: Constitutional: No fevers, chills, or sweats, no generalized fatigue, change in appetite Eyes: No visual changes, double vision, eye pain Ear, nose and throat: No hearing loss, ear pain, nasal congestion, sore throat Cardiovascular: No chest pain, palpitations Respiratory:  No shortness of breath at rest or with exertion, wheezes GastrointestinaI: No nausea, vomiting, diarrhea, abdominal pain, fecal incontinence Genitourinary:  No dysuria, urinary retention or frequency Musculoskeletal:  No neck pain, back pain Integumentary: No rash, pruritus, skin lesions Neurological: as above Psychiatric: depression, anxiety Endocrine: No palpitations, fatigue, diaphoresis, mood swings, change in appetite, change in weight, increased thirst Hematologic/Lymphatic:  No purpura, petechiae. Allergic/Immunologic: no itchy/runny eyes, nasal congestion, recent allergic reactions, rashes  PHYSICAL EXAM: Vitals:   04/04/17 0748  BP: 140/84  Pulse: 76   General: No acute distress.  Patient appears well-groomed.  normal body habitus. Head:  Normocephalic/atraumatic Eyes:  Fundi examined but not visualized Neck: supple, no paraspinal tenderness, full range of motion Heart:  Regular rate and rhythm Lungs:  Clear to auscultation bilaterally Back: No paraspinal tenderness Neurological Exam: alert and oriented to person, place, and time. Attention span and concentration  intact, recent and remote memory intact, fund of knowledge intact.  Speech fluent and not dysarthric, language intact.  CN II-XII intact. Bulk and tone normal, muscle strength 5/5 throughout.  Sensation to light touch, temperature and vibration intact.  Deep tendon reflexes 2+ throughout, toes downgoing.  Finger to nose and heel to shin testing intact.  Gait normal, Romberg negative.  IMPRESSION: 1.  Episodic headache, which sound like tension type headache.  Sed Rate elevated but my suspicion for temporal arteritis is low (these are chronic headaches and does not exhibit any other symptoms consistent with it). 2.  Episodic left facial numbness/weakness.  Unclear etiology.  A cerebrovascular event possible or possibly related to headache, which may be migraine. 3.  Depression/anxiety.  PLAN: 1.  Will check MRI of brain. 2.  She already takes ASA 81mg  daily 3.  Will start nortriptyline 25mg  at bedtime (addresses headache and depression) 4.  Limit Excedrin to no more than 2 days out of the week 5.  Discussed lifestyle modification:  Exercise, hydration, sleep  hygiene 6.  Follow up in 3 months but contact us in 4 weeks with update.  [name], DO  CC:  Renato Shin, MD

## 2017-04-04 NOTE — Patient Instructions (Signed)
  1.  Start nortriptyline 25mg  at bedtime.  Call in 4 weeks with update and we can adjust dose if needed. 2.  Take Excedrin but limit to no more than 2 days out of the week 3.  Limit use of pain relievers to no more than 2 days out of the week.  These medications include acetaminophen, ibuprofen, triptans and narcotics.  This will help reduce risk of rebound headaches. 4.  Be aware of common food triggers such as processed sweets, processed foods with nitrites (such as deli meat, hot dogs, sausages), foods with MSG, alcohol (such as wine), chocolate, certain cheeses, certain fruits (dried fruits, some citrus fruit), vinegar, diet soda. 4.  Avoid caffeine 5.  Routine exercise 6.  Proper sleep hygiene 7.  Stay adequately hydrated with water 8.  Keep a headache diary. 9.  Maintain proper stress management. 10.  Do not skip meals. 11.  Consider supplements:  Magnesium citrate 400mg  to 600mg  daily, riboflavin 400mg , Coenzyme Q 10 100mg  three times daily 12.  We will check MRI of brain 13.  Follow up in 3 months.

## 2017-04-16 ENCOUNTER — Ambulatory Visit
Admission: RE | Admit: 2017-04-16 | Discharge: 2017-04-16 | Disposition: A | Discharge status: Home / Self Care | Payer: BLUE CROSS/BLUE SHIELD | Source: Ambulatory Visit | Attending: Neurology | Admitting: Neurology

## 2017-04-16 DIAGNOSIS — F419 Anxiety disorder, unspecified: Secondary | ICD-10-CM

## 2017-04-16 DIAGNOSIS — F329 Major depressive disorder, single episode, unspecified: Secondary | ICD-10-CM

## 2017-04-16 DIAGNOSIS — R29810 Facial weakness: Secondary | ICD-10-CM

## 2017-04-16 DIAGNOSIS — R519 Headache, unspecified: Secondary | ICD-10-CM

## 2017-04-16 DIAGNOSIS — R51 Headache: Principal | ICD-10-CM

## 2017-04-16 DIAGNOSIS — R200 Anesthesia of skin: Secondary | ICD-10-CM

## 2017-04-18 ENCOUNTER — Telehealth: Payer: Self-pay | Admitting: *Deleted

## 2017-04-18 NOTE — Telephone Encounter (Signed)
-----   Message from Pieter Partridge, DO sent at 04/18/2017 10:51 AM EDT ----- MRI looks okay

## 2017-04-18 NOTE — Telephone Encounter (Signed)
Left message giving patient results.  

## 2017-05-16 ENCOUNTER — Other Ambulatory Visit: Payer: Self-pay | Admitting: Endocrinology

## 2017-05-27 ENCOUNTER — Other Ambulatory Visit: Payer: Self-pay | Admitting: Endocrinology

## 2017-06-03 ENCOUNTER — Other Ambulatory Visit: Payer: Self-pay

## 2017-06-03 MED ORDER — TRIAMTERENE-HCTZ 37.5-25 MG PO TABS: ORAL_TABLET | ORAL | 2 refills | Status: DC

## 2017-07-08 ENCOUNTER — Ambulatory Visit: Payer: BLUE CROSS/BLUE SHIELD | Admitting: Neurology

## 2017-07-25 ENCOUNTER — Other Ambulatory Visit: Payer: Self-pay | Admitting: Endocrinology

## 2017-07-27 ENCOUNTER — Ambulatory Visit: Payer: BLUE CROSS/BLUE SHIELD | Admitting: Neurology

## 2017-08-23 ENCOUNTER — Other Ambulatory Visit: Payer: Self-pay | Admitting: Endocrinology

## 2017-08-24 ENCOUNTER — Other Ambulatory Visit: Payer: Self-pay | Admitting: Endocrinology

## 2017-09-20 ENCOUNTER — Other Ambulatory Visit: Payer: Self-pay | Admitting: Endocrinology

## 2017-11-12 ENCOUNTER — Other Ambulatory Visit: Payer: Self-pay | Admitting: Endocrinology

## 2017-11-16 ENCOUNTER — Other Ambulatory Visit: Payer: Self-pay | Admitting: Endocrinology

## 2017-11-29 ENCOUNTER — Ambulatory Visit: Payer: BLUE CROSS/BLUE SHIELD | Admitting: Endocrinology

## 2017-12-05 ENCOUNTER — Encounter: Payer: Self-pay | Admitting: Endocrinology

## 2017-12-05 ENCOUNTER — Ambulatory Visit (INDEPENDENT_AMBULATORY_CARE_PROVIDER_SITE_OTHER): Payer: BLUE CROSS/BLUE SHIELD | Admitting: Endocrinology

## 2017-12-05 VITALS — BP 146/78 | HR 80 | Ht 67.0 in | Wt 225.0 lb

## 2017-12-05 DIAGNOSIS — D509 Iron deficiency anemia, unspecified: Secondary | ICD-10-CM | POA: Diagnosis not present

## 2017-12-05 DIAGNOSIS — N289 Disorder of kidney and ureter, unspecified: Secondary | ICD-10-CM | POA: Diagnosis not present

## 2017-12-05 DIAGNOSIS — Z0000 Encounter for general adult medical examination without abnormal findings: Secondary | ICD-10-CM

## 2017-12-05 DIAGNOSIS — I10 Essential (primary) hypertension: Secondary | ICD-10-CM

## 2017-12-05 LAB — IBC PANEL
Iron: 90 ug/dL (ref 42–145)
SATURATION RATIOS: 29.8 % (ref 20.0–50.0)
TRANSFERRIN: 216 mg/dL (ref 212.0–360.0)

## 2017-12-05 LAB — BASIC METABOLIC PANEL
BUN: 15 mg/dL (ref 6–23)
CHLORIDE: 100 meq/L (ref 96–112)
CO2: 32 meq/L (ref 19–32)
CREATININE: 1.45 mg/dL — AB (ref 0.40–1.20)
Calcium: 9.3 mg/dL (ref 8.4–10.5)
GFR: 46.63 mL/min — ABNORMAL LOW (ref >60.00)
Glucose, Bld: 89 mg/dL (ref 70–99)
POTASSIUM: 3.4 meq/L — AB (ref 3.5–5.1)
Sodium: 141 mEq/L (ref 135–145)

## 2017-12-05 LAB — CBC WITH DIFFERENTIAL/PLATELET
BASOS ABS: 0 10*3/uL (ref 0.0–0.1)
Basophils Relative: 0.8 % (ref 0.0–3.0)
EOS ABS: 0.2 10*3/uL (ref 0.0–0.7)
Eosinophils Relative: 3.8 % (ref 0.0–5.0)
HCT: 37.5 % (ref 36.0–46.0)
Hemoglobin: 12 g/dL (ref 12.0–15.0)
LYMPHS ABS: 1.7 10*3/uL (ref 0.7–4.0)
LYMPHS PCT: 42.8 % (ref 12.0–46.0)
MCHC: 32.1 g/dL (ref 30.0–36.0)
MCV: 85 fl (ref 78.0–100.0)
MONOS PCT: 6.9 % (ref 3.0–12.0)
Monocytes Absolute: 0.3 10*3/uL (ref 0.1–1.0)
NEUTROS PCT: 45.7 % (ref 43.0–77.0)
Neutro Abs: 1.9 10*3/uL (ref 1.4–7.7)
Platelets: 341 10*3/uL (ref 150.0–400.0)
RBC: 4.41 Mil/uL (ref 3.87–5.11)
RDW: 14.2 % (ref 11.5–15.5)
WBC: 4.1 10*3/uL (ref 4.0–10.5)

## 2017-12-05 MED ORDER — LOSARTAN POTASSIUM 25 MG PO TABS: 25.0000 mg | ORAL_TABLET | Freq: Every day | ORAL | 3 refills | Status: DC

## 2017-12-05 NOTE — Patient Instructions (Addendum)
Here are some tests for blood in the bowels.  please follow the instructions, and return to the lab.  blood tests are requested for you today.  We'll let you know about the results.  Based on the results, we can increase your blood pressure medication.   Please consider these measures for your health:  minimize alcohol.  Do not use tobacco products.  Have a colonoscopy at least every 10 years from age 64.  Women should have an annual mammogram from age 44.  Keep firearms safely stored.  Always use seat belts.  have working smoke alarms in your home.  See an eye doctor and dentist regularly.  Never drive under the influence of alcohol or drugs (including prescription drugs).   Please come back for a follow-up appointment in 1 year.

## 2017-12-05 NOTE — Progress Notes (Signed)
Subjective:    Patient ID: Jocelyn Ford, female    DOB: [birth year], 66 y.o.   MRN: [id number]  HPI Pt is here for regular wellness examination, and is feeling pretty well in general, and says chronic med probs are stable, except as noted below Past Medical History:  Diagnosis Date  . Acute gouty arthropathy   . Allergic rhinitis, cause unspecified   . Arthritis   . Dizziness and giddiness   . Dysuria   . Esophageal reflux   . Iron deficiency anemia, unspecified   . Nonspecific abnormal electrocardiogram (ECG) (EKG)   . Osteoporosis, unspecified   . Pure hypercholesterolemia   . Routine general medical examination at a health care facility   . Unspecified essential hypertension     Past Surgical History:  Procedure Laterality Date  . ABDOMINAL HYSTERECTOMY    . LEFT HEART CATHETERIZATION WITH CORONARY ANGIOGRAM N/A 01/28/2015   Procedure: LEFT HEART CATHETERIZATION WITH CORONARY ANGIOGRAM;  Surgeon: Candee Furbish, MD;  Location: [facility] CATH LAB;  Service: Cardiovascular;  Laterality: N/A;    Social History   Socioeconomic History  . Marital status: Divorced    Spouse name: n/a  . Number of children: 1  . Years of education: 35  . Highest education level: Not on file  Social Needs  . Financial resource strain: Not on file  . Food insecurity - worry: Not on file  . Food insecurity - inability: Not on file  . Transportation needs - medical: Not on file  . Transportation needs - non-medical: Not on file  Occupational History  . Occupation: IT sales professional: [employer]  Tobacco Use  . Smoking status: Former Research scientist (life sciences)  . Smokeless tobacco: Never Used  Substance and Sexual Activity  . Alcohol use: No    Alcohol/week: 0.0 oz  . Drug use: No  . Sexual activity: No  Other Topics Concern  . Not on file  Social History Narrative   Lives alone.  Her daughter lives nearby.    Current Outpatient Medications on File Prior to Visit  Medication Sig Dispense  Refill  . amLODipine (NORVASC) 2.5 MG tablet TAKE 1 TABLET (2.5 MG TOTAL) BY MOUTH DAILY. 90 tablet 1  . aspirin EC 81 MG tablet Take 1 tablet (81 mg total) by mouth daily. 90 tablet 3  . atenolol (TENORMIN) 25 MG tablet TAKE 1 TABLET TWICE A DAY 180 tablet 2  . furosemide (LASIX) 40 MG tablet TAKE 1 TABLET BY MOUTH EVERY DAY 30 tablet 3  . Guaifenesin (MUCINEX MAXIMUM STRENGTH) 1200 MG TB12 Take 1 tablet (1,200 mg total) by mouth every 12 (twelve) hours as needed. 14 tablet 1  . Multiple Vitamin (MULTIVITAMIN) capsule Take 2 capsules by mouth daily.    Marland Kitchen omeprazole (PRILOSEC) 40 MG capsule TAKE 1 CAPSULE DAILY 90 capsule 2  . potassium chloride SA (KLOR-CON M20) 20 MEQ tablet Take 3 tablets (60 mEq total) by mouth 3 (three) times daily. 270 tablet 11  . rosuvastatin (CRESTOR) 10 MG tablet Take 1 tablet (10 mg total) by mouth daily. 90 tablet 3  . triamterene-hydrochlorothiazide (MAXZIDE-25) 37.5-25 MG tablet TAKE 1/2 TABLET BY MOUTH ONCE A DAY 30 tablet 2  . allopurinol (ZYLOPRIM) 300 MG tablet TAKE 1 TABLET DAILY (Patient not taking: Reported on 12/05/2017) 90 tablet 1  . ipratropium (ATROVENT) 0.03 % nasal spray Place 2 sprays into both nostrils 2 (two) times daily. (Patient not taking: Reported on 12/05/2017) 30 mL 0   No current  facility-administered medications on file prior to visit.     Allergies  Allergen Reactions  . Doxycycline   . Nifedipine     REACTION: Nausea,weakness    Family History  Problem Relation Age of Onset  . Cancer Father        Prostate  . Alzheimer's disease Mother     BP (!) 146/78   Pulse 80   Ht 5\' 7"  (1.702 m)   Wt 225 lb (102.1 kg)   SpO2 94%   BMI 35.24 kg/m   Review of Systems Denies fever, visual loss, hearing loss, chest pain, back pain, depression, cold intolerance, BRBPR, hematuria, syncope, numbness, easy bruising, and rash.  She has fatigue and rhinorrhea.       Objective:   Physical Exam VS: see vs page GEN: no distress HEAD:  head: no deformity eyes: no periorbital swelling, no proptosis.  external nose and ears are normal mouth: no lesion seen NECK: supple, thyroid is not enlarged.  CHEST WALL: no deformity.  LUNGS:  Clear to auscultation CV: reg rate and rhythm, no murmur.  ABD: abdomen is soft, nontender.  no hepatosplenomegaly.  not distended.  no hernia.  MUSCULOSKELETAL: muscle bulk and strength are grossly normal.  no obvious joint swelling.  gait is normal and steady.  EXTEMITIES: no deformity.  no ulcer on the feet.  feet are of normal color and temp.   There is bilateral onychomycosis of the toenails PULSES: dorsalis pedis intact bilat.  no carotid bruit.  NEURO:  cn 2-12 grossly intact.   readily moves all 4's.  sensation is intact to touch on the feet SKIN:  Normal texture and temperature.  No rash or suspicious lesion is visible.   NODES:  None palpable at the neck.  PSYCH: alert, well-oriented.  Does not appear anxious nor depressed.   I personally reviewed electrocardiogram tracing (today):  Indication: HTN Impression: NSR.  No MI.  No hypertrophy. Compared to 2017: no significant change     Assessment & Plan:  Wellness visit today, with problems stable, except as noted.   Patient Instructions  Here are some tests for blood in the bowels.  please follow the instructions, and return to the lab.  blood tests are requested for you today.  We'll let you know about the results.  Based on the results, we can increase your blood pressure medication.   Please consider these measures for your health:  minimize alcohol.  Do not use tobacco products.  Have a colonoscopy at least every 10 years from age 52.  Women should have an annual mammogram from age 39.  Keep firearms safely stored.  Always use seat belts.  have working smoke alarms in your home.  See an eye doctor and dentist regularly.  Never drive under the influence of alcohol or drugs (including prescription drugs).   Please come back for a  follow-up appointment in 1 year.     SEPARATE EVALUATION FOLLOWS--EACH PROBLEM HERE IS NEW, NOT RESPONDING TO TREATMENT, OR POSES SIGNIFICANT RISK TO THE PATIENT'S HEALTH: HISTORY OF THE PRESENT ILLNESS: She takes BP meds as rx'ed.  She has slight diffuse edema.   PAST MEDICAL HISTORY Past Medical History:  Diagnosis Date  . Acute gouty arthropathy   . Allergic rhinitis, cause unspecified   . Arthritis   . Dizziness and giddiness   . Dysuria   . Esophageal reflux   . Iron deficiency anemia, unspecified   . Nonspecific abnormal electrocardiogram (ECG) (EKG)   . Osteoporosis,  unspecified   . Pure hypercholesterolemia   . Routine general medical examination at a health care facility   . Unspecified essential hypertension     Past Surgical History:  Procedure Laterality Date  . ABDOMINAL HYSTERECTOMY    . LEFT HEART CATHETERIZATION WITH CORONARY ANGIOGRAM N/A 01/28/2015   Procedure: LEFT HEART CATHETERIZATION WITH CORONARY ANGIOGRAM;  Surgeon: Candee Furbish, MD;  Location: [facility] CATH LAB;  Service: Cardiovascular;  Laterality: N/A;    Social History   Socioeconomic History  . Marital status: Divorced    Spouse name: n/a  . Number of children: 1  . Years of education: 38  . Highest education level: Not on file  Social Needs  . Financial resource strain: Not on file  . Food insecurity - worry: Not on file  . Food insecurity - inability: Not on file  . Transportation needs - medical: Not on file  . Transportation needs - non-medical: Not on file  Occupational History  . Occupation: IT sales professional: [employer]  Tobacco Use  . Smoking status: Former Research scientist (life sciences)  . Smokeless tobacco: Never Used  Substance and Sexual Activity  . Alcohol use: No    Alcohol/week: 0.0 oz  . Drug use: No  . Sexual activity: No  Other Topics Concern  . Not on file  Social History Narrative   Lives alone.  Her daughter lives nearby.    Current Outpatient Medications on File Prior  to Visit  Medication Sig Dispense Refill  . amLODipine (NORVASC) 2.5 MG tablet TAKE 1 TABLET (2.5 MG TOTAL) BY MOUTH DAILY. 90 tablet 1  . aspirin EC 81 MG tablet Take 1 tablet (81 mg total) by mouth daily. 90 tablet 3  . atenolol (TENORMIN) 25 MG tablet TAKE 1 TABLET TWICE A DAY 180 tablet 2  . furosemide (LASIX) 40 MG tablet TAKE 1 TABLET BY MOUTH EVERY DAY 30 tablet 3  . Guaifenesin (MUCINEX MAXIMUM STRENGTH) 1200 MG TB12 Take 1 tablet (1,200 mg total) by mouth every 12 (twelve) hours as needed. 14 tablet 1  . Multiple Vitamin (MULTIVITAMIN) capsule Take 2 capsules by mouth daily.    Marland Kitchen omeprazole (PRILOSEC) 40 MG capsule TAKE 1 CAPSULE DAILY 90 capsule 2  . potassium chloride SA (KLOR-CON M20) 20 MEQ tablet Take 3 tablets (60 mEq total) by mouth 3 (three) times daily. 270 tablet 11  . rosuvastatin (CRESTOR) 10 MG tablet Take 1 tablet (10 mg total) by mouth daily. 90 tablet 3  . triamterene-hydrochlorothiazide (MAXZIDE-25) 37.5-25 MG tablet TAKE 1/2 TABLET BY MOUTH ONCE A DAY 30 tablet 2  . allopurinol (ZYLOPRIM) 300 MG tablet TAKE 1 TABLET DAILY (Patient not taking: Reported on 12/05/2017) 90 tablet 1  . ipratropium (ATROVENT) 0.03 % nasal spray Place 2 sprays into both nostrils 2 (two) times daily. (Patient not taking: Reported on 12/05/2017) 30 mL 0   No current facility-administered medications on file prior to visit.     Allergies  Allergen Reactions  . Doxycycline   . Nifedipine     REACTION: Nausea,weakness    Family History  Problem Relation Age of Onset  . Cancer Father        Prostate  . Alzheimer's disease Mother     BP (!) 146/78   Pulse 80   Ht 5\' 7"  (1.702 m)   Wt 225 lb (102.1 kg)   SpO2 94%   BMI 35.24 kg/m   REVIEW OF SYSTEMS: Denies sob PHYSICAL EXAMINATION: VITAL SIGNS:  See vs page  GENERAL: no distress Ext: trace bilat leg edema IMPRESSION: Renal failure: recheck today Hypokalemia: recheck today HTN: she needs increased rx Anemia: due for  recheck Edema: despite lasix.  PLAN:  Check labs We'll add losartan if possible, then recheck

## 2017-12-14 ENCOUNTER — Other Ambulatory Visit: Payer: Self-pay | Admitting: Endocrinology

## 2017-12-14 NOTE — Telephone Encounter (Signed)
Ok to refill 

## 2017-12-16 ENCOUNTER — Other Ambulatory Visit: Payer: Self-pay

## 2017-12-21 ENCOUNTER — Other Ambulatory Visit: Payer: Self-pay

## 2017-12-21 ENCOUNTER — Other Ambulatory Visit: Payer: BLUE CROSS/BLUE SHIELD

## 2018-01-06 ENCOUNTER — Telehealth: Payer: Self-pay | Admitting: Endocrinology

## 2018-01-06 NOTE — Telephone Encounter (Signed)
Patient wants Judson Roch to call her at ph# [phone] re: Losartan Potassium

## 2018-01-09 ENCOUNTER — Other Ambulatory Visit (INDEPENDENT_AMBULATORY_CARE_PROVIDER_SITE_OTHER): Payer: BLUE CROSS/BLUE SHIELD

## 2018-01-09 DIAGNOSIS — N289 Disorder of kidney and ureter, unspecified: Secondary | ICD-10-CM | POA: Diagnosis not present

## 2018-01-09 LAB — BASIC METABOLIC PANEL
BUN: 14 mg/dL (ref 6–23)
CALCIUM: 9.6 mg/dL (ref 8.4–10.5)
CO2: 30 mEq/L (ref 19–32)
CREATININE: 1.27 mg/dL — AB (ref 0.40–1.20)
Chloride: 100 mEq/L (ref 96–112)
GFR: 54.32 mL/min — AB (ref >60.00)
Glucose, Bld: 90 mg/dL (ref 70–99)
Potassium: 3.4 mEq/L — ABNORMAL LOW (ref 3.5–5.1)
Sodium: 140 mEq/L (ref 135–145)

## 2018-01-11 NOTE — Telephone Encounter (Signed)
I called patient & left VM for her to call back.

## 2018-01-20 ENCOUNTER — Other Ambulatory Visit: Payer: Self-pay

## 2018-01-20 ENCOUNTER — Encounter: Payer: Self-pay | Admitting: Family Medicine

## 2018-01-20 ENCOUNTER — Ambulatory Visit (INDEPENDENT_AMBULATORY_CARE_PROVIDER_SITE_OTHER): Payer: BLUE CROSS/BLUE SHIELD | Admitting: Family Medicine

## 2018-01-20 VITALS — BP 148/74 | HR 77 | Temp 98.2°F | Ht 67.0 in | Wt 221.0 lb

## 2018-01-20 DIAGNOSIS — M545 Low back pain, unspecified: Secondary | ICD-10-CM

## 2018-01-20 DIAGNOSIS — J069 Acute upper respiratory infection, unspecified: Secondary | ICD-10-CM

## 2018-01-20 LAB — POCT URINALYSIS DIP (MANUAL ENTRY)
Bilirubin, UA: NEGATIVE
Blood, UA: NEGATIVE
Glucose, UA: NEGATIVE mg/dL
Ketones, POC UA: NEGATIVE mg/dL
Leukocytes, UA: NEGATIVE
Nitrite, UA: NEGATIVE
Protein Ur, POC: NEGATIVE mg/dL
Spec Grav, UA: 1.015 (ref 1.010–1.025)
Urobilinogen, UA: 0.2 E.U./dL
pH, UA: 7 (ref 5.0–8.0)

## 2018-01-20 MED ORDER — AZELASTINE HCL 0.1 % NA SOLN: 1.0000 | Freq: Two times a day (BID) | NASAL | 0 refills | Status: DC

## 2018-01-20 NOTE — Progress Notes (Signed)
2/1/20199:25 AM  Jocelyn Ford [birth year], 67 y.o. female [id number]  Chief Complaint  Patient presents with  . Nasal Congestion    with sharp pain to the left ear.   . Back Pain    left side for 2 wks    HPI:   Patient is a 67 y.o. female with past medical history significant for HTN who presents today for 3 days of nasal congestion, leaf ear pain, PND, cough, dizziness, no fever or chills, no SOB, mild nausea but no vomiting, no diarrhea or rashes. She has been taking OTC mucinex without much relief.   She is also c/o Left back pain, denies any inciting events, trauma, radiation of pain, dysuria or hematuria, any changes in bowel or bladder function.   Depression screen [facility] 2/9 01/20/2018 12/22/2016 02/27/2016  Decreased Interest 0 0 0  Down, Depressed, Hopeless 0 0 0  PHQ - 2 Score 0 0 0    Allergies  Allergen Reactions  . Doxycycline   . Nifedipine     REACTION: Nausea,weakness    Prior to Admission medications   Medication Sig Start Date End Date Taking? Authorizing Provider  allopurinol (ZYLOPRIM) 300 MG tablet TAKE 1 TABLET DAILY 11/12/17  Yes Renato Shin, MD  amLODipine (NORVASC) 2.5 MG tablet TAKE 1 TABLET (2.5 MG TOTAL) BY MOUTH DAILY. 09/20/17  Yes Renato Shin, MD  aspirin EC 81 MG tablet Take 1 tablet (81 mg total) by mouth daily. 12/25/14  Yes Jerline Pain, MD  atenolol (TENORMIN) 25 MG tablet TAKE 1 TABLET TWICE A DAY 11/12/17  Yes Renato Shin, MD  furosemide (LASIX) 40 MG tablet TAKE 1 TABLET BY MOUTH EVERY DAY 11/16/17  Yes Renato Shin, MD  Guaifenesin [facility] MAXIMUM STRENGTH) 1200 MG TB12 Take 1 tablet (1,200 mg total) by mouth every 12 (twelve) hours as needed. 12/22/16  Yes English, Colletta [place] D, PA  ipratropium (ATROVENT) 0.03 % nasal spray Place 2 sprays into both nostrils 2 (two) times daily. 02/27/16  Yes Jeffery, Chelle, PA-C  KLOR-CON M20 20 MEQ tablet (INS ONLY PAYS FOR 5 PER DAY)TAKE 3 TABLETS (60 MEQ TOTAL) BY MOUTH 3 (THREE) TIMES DAILY.  12/14/17  Yes Renato Shin, MD  losartan (COZAAR) 25 MG tablet Take 1 tablet (25 mg total) by mouth daily. 12/05/17  Yes Renato Shin, MD  Multiple Vitamin (MULTIVITAMIN) capsule Take 2 capsules by mouth daily.   Yes [provider]  omeprazole (PRILOSEC) 40 MG capsule TAKE 1 CAPSULE DAILY 11/12/17  Yes Renato Shin, MD  rosuvastatin (CRESTOR) 10 MG tablet Take 1 tablet (10 mg total) by mouth daily. 02/15/17  Yes Renato Shin, MD  triamterene-hydrochlorothiazide [facility]) 37.5-25 MG tablet TAKE 1/2 TABLET BY MOUTH ONCE A DAY 06/03/17  Yes Renato Shin, MD    Past Medical History:  Diagnosis Date  . Acute gouty arthropathy   . Allergic rhinitis, cause unspecified   . Arthritis   . Dizziness and giddiness   . Dysuria   . Esophageal reflux   . Iron deficiency anemia, unspecified   . Nonspecific abnormal electrocardiogram (ECG) (EKG)   . Osteoporosis, unspecified   . Pure hypercholesterolemia   . Routine general medical examination at a health care facility   . Unspecified essential hypertension     Past Surgical History:  Procedure Laterality Date  . ABDOMINAL HYSTERECTOMY    . LEFT HEART CATHETERIZATION WITH CORONARY ANGIOGRAM N/A 01/28/2015   Procedure: LEFT HEART CATHETERIZATION WITH CORONARY ANGIOGRAM;  Surgeon: Candee Furbish, MD;  Location: [facility]  CATH LAB;  Service: Cardiovascular;  Laterality: N/A;    Social History   Tobacco Use  . Smoking status: Former Research scientist (life sciences)  . Smokeless tobacco: Never Used  Substance Use Topics  . Alcohol use: No    Alcohol/week: 0.0 oz    Family History  Problem Relation Age of Onset  . Cancer Father        Prostate  . Alzheimer's disease Mother     ROS Per hpi  OBJECTIVE:  Blood pressure (!) 148/74, pulse 77, temperature 98.2 F (36.8 C), temperature source Oral, height 5\' 7"  (1.702 m), weight 221 lb (100.2 kg), SpO2 98 %.  Physical Exam  Constitutional: She is oriented to person, place, and time and well-developed,  well-nourished, and in no distress.  HENT:  Head: Normocephalic and atraumatic.  Right Ear: Hearing, tympanic membrane, external ear and ear canal normal.  Left Ear: Hearing, tympanic membrane, external ear and ear canal normal.  Mouth/Throat: Oropharynx is clear and moist.  Eyes: EOM are normal. Pupils are equal, round, and reactive to light.  Neck: Neck supple.  Cardiovascular: Normal rate, regular rhythm and normal heart sounds. Exam reveals no gallop and no friction rub.  No murmur heard. Pulmonary/Chest: Effort normal and breath sounds normal. She has no wheezes. She has no rales.  Musculoskeletal:       Thoracic back: She exhibits tenderness. She exhibits normal range of motion, no bony tenderness and no spasm.  Lymphadenopathy:    She has no cervical adenopathy.  Neurological: She is alert and oriented to person, place, and time. Gait normal.  Skin: Skin is warm and dry.    Results for orders placed or performed in visit on 01/20/18 (from the past 24 hour(s))  POCT urinalysis dipstick     Status: None   Collection Time: 01/20/18  8:50 AM  Result Value Ref Range   Color, UA yellow yellow   Clarity, UA clear clear   Glucose, UA negative negative mg/dL   Bilirubin, UA negative negative   Ketones, POC UA negative negative mg/dL   Spec Grav, UA 1.015 1.010 - 1.025   Blood, UA negative negative   pH, UA 7.0 5.0 - 8.0   Protein Ur, POC negative negative mg/dL   Urobilinogen, UA 0.2 0.2 or 1.0 E.U./dL   Nitrite, UA Negative Negative   Leukocytes, UA Negative Negative    ASSESSMENT and PLAN 1. URI with cough and congestion Discussed supportive measures for URI: increase hydration, rest, OTC medications, etc. RTC precautions discussed.  2. Low back pain without sciatica, unspecified back pain laterality, unspecified chronicity Discussed supportive measures, NSAIDS prn, gentle stretching, heat. RTC precautions. - POCT urinalysis dipstick  Other orders - azelastine (ASTELIN)  0.1 % nasal spray; Place 1 spray into both nostrils 2 (two) times daily. Use in each nostril as directed  Return if symptoms worsen or fail to improve.    Rutherford Guys, MD [facility] at [facility] [place], [place] [address] Ph.  [phone] Fax [phone]

## 2018-01-20 NOTE — Patient Instructions (Addendum)
1. Over the counter oral decongestant, phenylephrine    IF you received an x-ray today, you will receive an invoice from [facility]. Please contact [facility] at [phone] with questions or concerns regarding your invoice.   IF you received labwork today, you will receive an invoice from [facility]. Please contact [facility] at [phone] with questions or concerns regarding your invoice.   Our billing staff will not be able to assist you with questions regarding bills from these companies.  You will be contacted with the lab results as soon as they are available. The fastest way to get your results is to activate your My Chart account. Instructions are located on the last page of this paperwork. If you have not heard from [place] regarding the results in 2 weeks, please contact this office.     Back Pain, Adult Many adults have back pain from time to time. Common causes of back pain include:  A strained muscle or ligament.  Wear and tear (degeneration) of the spinal disks.  Arthritis.  A hit to the back.  Back pain can be short-lived (acute) or last a long time (chronic). A physical exam, lab tests, and imaging studies may be done to find the cause of your pain. Follow these instructions at home: Managing pain and stiffness  Take over-the-counter and prescription medicines only as told by your health care provider.  If directed, apply heat to the affected area as often as told by your health care provider. Use the heat source that your health care provider recommends, such as a moist heat pack or a heating pad. ? Place a towel between your skin and the heat source. ? Leave the heat on for 20-30 minutes. ? Remove the heat if your skin turns bright red. This is especially important if you are unable to feel pain, heat, or cold. You have a greater risk of getting burned.  If directed, apply ice to the injured area: ? Put ice in a plastic bag. ? Place a towel  between your skin and the bag. ? Leave the ice on for 20 minutes, 2-3 times a day for the first 2-3 days. Activity  Do not stay in bed. Resting more than 1-2 days can delay your recovery.  Take short walks on even surfaces as soon as you are able. Try to increase the length of time you walk each day.  Do not sit, drive, or stand in one place for more than 30 minutes at a time. Sitting or standing for long periods of time can put stress on your back.  Use proper lifting techniques. When you bend and lift, use positions that put less stress on your back: ? [place] your knees. ? Keep the load close to your body. ? Avoid twisting.  Exercise regularly as told by your health care provider. Exercising will help your back heal faster. This also helps prevent back injuries by keeping muscles strong and flexible.  Your health care provider may recommend that you see a physical therapist. This person can help you come up with a safe exercise program. Do any exercises as told by your physical therapist. Lifestyle  Maintain a healthy weight. Extra weight puts stress on your back and makes it difficult to have good posture.  Avoid activities or situations that make you feel anxious or stressed. Learn ways to manage anxiety and stress. One way to manage stress is through exercise. Stress and anxiety increase muscle tension and can make back pain worse. General  instructions  Sleep on a firm mattress in a comfortable position. Try lying on your side with your knees slightly bent. If you lie on your back, put a pillow under your knees.  Follow your treatment plan as told by your health care provider. This may include: ? Cognitive or behavioral therapy. ? Acupuncture or massage therapy. ? Meditation or yoga. Contact a health care provider if:  You have pain that is not relieved with rest or medicine.  You have increasing pain going down into your legs or buttocks.  Your pain does not improve in 2  weeks.  You have pain at night.  You lose weight.  You have a fever or chills. Get help right away if:  You develop new bowel or bladder control problems.  You have unusual weakness or numbness in your arms or legs.  You develop nausea or vomiting.  You develop abdominal pain.  You feel faint. Summary  Many adults have back pain from time to time. A physical exam, lab tests, and imaging studies may be done to find the cause of your pain.  Use proper lifting techniques. When you bend and lift, use positions that put less stress on your back.  Take over-the-counter and prescription medicines and apply heat or ice as directed by your health care provider. This information is not intended to replace advice given to you by your health care provider. Make sure you discuss any questions you have with your health care provider. Document Released: 12/06/2005 Document Revised: 01/10/2017 Document Reviewed: 01/10/2017 Elsevier Interactive Patient Education  [employer].

## 2018-02-18 ENCOUNTER — Other Ambulatory Visit: Payer: Self-pay | Admitting: Family Medicine

## 2018-02-18 DIAGNOSIS — Z760 Encounter for issue of repeat prescription: Secondary | ICD-10-CM

## 2018-03-16 ENCOUNTER — Other Ambulatory Visit: Payer: Self-pay | Admitting: Endocrinology

## 2018-04-24 ENCOUNTER — Ambulatory Visit
Admission: RE | Admit: 2018-04-24 | Discharge: 2018-04-24 | Disposition: A | Discharge status: Home / Self Care | Payer: BLUE CROSS/BLUE SHIELD | Source: Ambulatory Visit | Attending: Endocrinology | Admitting: Endocrinology

## 2018-04-24 ENCOUNTER — Encounter: Payer: Self-pay | Admitting: Endocrinology

## 2018-04-24 ENCOUNTER — Ambulatory Visit (INDEPENDENT_AMBULATORY_CARE_PROVIDER_SITE_OTHER): Payer: BLUE CROSS/BLUE SHIELD | Admitting: Endocrinology

## 2018-04-24 VITALS — BP 158/78 | HR 82 | Temp 98.5°F | Wt 222.2 lb

## 2018-04-24 DIAGNOSIS — N289 Disorder of kidney and ureter, unspecified: Secondary | ICD-10-CM

## 2018-04-24 DIAGNOSIS — E213 Hyperparathyroidism, unspecified: Secondary | ICD-10-CM

## 2018-04-24 DIAGNOSIS — R0600 Dyspnea, unspecified: Secondary | ICD-10-CM

## 2018-04-24 DIAGNOSIS — R202 Paresthesia of skin: Secondary | ICD-10-CM | POA: Diagnosis not present

## 2018-04-24 LAB — BASIC METABOLIC PANEL
BUN: 15 mg/dL (ref 6–23)
CO2: 30 meq/L (ref 19–32)
Calcium: 9.9 mg/dL (ref 8.4–10.5)
Chloride: 100 mEq/L (ref 96–112)
Creatinine, Ser: 1.36 mg/dL — ABNORMAL HIGH (ref 0.40–1.20)
GFR: 50.15 mL/min — AB (ref >60.00)
GLUCOSE: 93 mg/dL (ref 70–99)
POTASSIUM: 3.4 meq/L — AB (ref 3.5–5.1)
SODIUM: 140 meq/L (ref 135–145)

## 2018-04-24 LAB — VITAMIN B12: Vitamin B-12: 269 pg/mL (ref 211–911)

## 2018-04-24 LAB — VITAMIN D 25 HYDROXY (VIT D DEFICIENCY, FRACTURES): VITD: 20.53 ng/mL — ABNORMAL LOW (ref 30.00–100.00)

## 2018-04-24 LAB — BRAIN NATRIURETIC PEPTIDE: PRO B NATRI PEPTIDE: 20 pg/mL (ref 0.0–100.0)

## 2018-04-24 MED ORDER — LOSARTAN POTASSIUM 50 MG PO TABS: 50.0000 mg | ORAL_TABLET | Freq: Every day | ORAL | 3 refills | Status: DC

## 2018-04-24 NOTE — Patient Instructions (Addendum)
blood tests and x-rays are requested for you today.  We'll let you know about the results.   Based on the blood tests, we adjust your meds to help the blood pressure.  The preferred option is to increase the losartan.  We'll do that if we can.  We may have to address the swelling after that.   Also, if the blood tests don't tell us a cause for the tingling, you should consider seeing a neurologist for that.   Please come back for a follow-up appointment in 2 weeks.

## 2018-04-24 NOTE — Progress Notes (Signed)
Subjective:    Patient ID: Jocelyn Ford, female    DOB: October 14, 1951, 67 y.o.   MRN: [id number]  HPI 2 weeks of slight swelling of the legs, and assoc doe.  She does not take vit-D supplement.  Past Medical History:  Diagnosis Date  . Acute gouty arthropathy   . Allergic rhinitis, cause unspecified   . Arthritis   . Dizziness and giddiness   . Dysuria   . Esophageal reflux   . Iron deficiency anemia, unspecified   . Nonspecific abnormal electrocardiogram (ECG) (EKG)   . Osteoporosis, unspecified   . Pure hypercholesterolemia   . Routine general medical examination at a health care facility   . Unspecified essential hypertension     Past Surgical History:  Procedure Laterality Date  . ABDOMINAL HYSTERECTOMY    . LEFT HEART CATHETERIZATION WITH CORONARY ANGIOGRAM N/A 01/28/2015   Procedure: LEFT HEART CATHETERIZATION WITH CORONARY ANGIOGRAM;  Surgeon: Candee Furbish, MD;  Location: [facility] CATH LAB;  Service: Cardiovascular;  Laterality: N/A;    Social History   Socioeconomic History  . Marital status: Divorced    Spouse name: n/a  . Number of children: 1  . Years of education: 10  . Highest education level: Not on file  Occupational History  . Occupation: IT sales professional: [employer]  . Financial resource strain: Not on file  . Food insecurity:    Worry: Not on file    Inability: Not on file  . Transportation needs:    Medical: Not on file    Non-medical: Not on file  Tobacco Use  . Smoking status: Former Research scientist (life sciences)  . Smokeless tobacco: Never Used  Substance and Sexual Activity  . Alcohol use: No    Alcohol/week: 0.0 oz  . Drug use: No  . Sexual activity: Never  Lifestyle  . Physical activity:    Days per week: Not on file    Minutes per session: Not on file  . Stress: Not on file  Relationships  . Social connections:    Talks on phone: Not on file    Gets together: Not on file    Attends religious service: Not on file   Active member of club or organization: Not on file    Attends meetings of clubs or organizations: Not on file    Relationship status: Not on file  . Intimate partner violence:    Fear of current or ex partner: Not on file    Emotionally abused: Not on file    Physically abused: Not on file    Forced sexual activity: Not on file  Other Topics Concern  . Not on file  Social History Narrative   Lives alone.  Her daughter lives nearby.    Current Outpatient Medications on File Prior to Visit  Medication Sig Dispense Refill  . allopurinol (ZYLOPRIM) 300 MG tablet TAKE 1 TABLET DAILY 90 tablet 1  . amLODipine (NORVASC) 2.5 MG tablet TAKE 1 TABLET BY MOUTH EVERY DAY 90 tablet 1  . aspirin EC 81 MG tablet Take 1 tablet (81 mg total) by mouth daily. 90 tablet 3  . atenolol (TENORMIN) 25 MG tablet TAKE 1 TABLET TWICE A DAY 180 tablet 2  . azelastine (ASTELIN) 0.1 % nasal spray PLACE 1 SPRAY INTO BOTH NOSTRILS 2 TIMES DAILY. USE IN EACH NOSTRIL AS DIRECTED 30 mL 0  . furosemide (LASIX) 40 MG tablet TAKE 1 TABLET BY MOUTH EVERY DAY 30 tablet 3  .  Guaifenesin (MUCINEX MAXIMUM STRENGTH) 1200 MG TB12 Take 1 tablet (1,200 mg total) by mouth every 12 (twelve) hours as needed. 14 tablet 1  . KLOR-CON M20 20 MEQ tablet (INS ONLY PAYS FOR 5 PER DAY)TAKE 3 TABLETS (60 MEQ TOTAL) BY MOUTH 3 (THREE) TIMES DAILY. 270 tablet 0  . omeprazole (PRILOSEC) 40 MG capsule TAKE 1 CAPSULE DAILY 90 capsule 2  . triamterene-hydrochlorothiazide (MAXZIDE-25) 37.5-25 MG tablet TAKE 1/2 TABLET BY MOUTH ONCE A DAY 30 tablet 2  . ipratropium (ATROVENT) 0.03 % nasal spray Place 2 sprays into both nostrils 2 (two) times daily. (Patient not taking: Reported on 04/24/2018) 30 mL 0  . Multiple Vitamin (MULTIVITAMIN) capsule Take 2 capsules by mouth daily.    . rosuvastatin (CRESTOR) 10 MG tablet Take 1 tablet (10 mg total) by mouth daily. (Patient not taking: Reported on 04/24/2018) 90 tablet 3   No current facility-administered  medications on file prior to visit.     Allergies  Allergen Reactions  . Doxycycline   . Nifedipine     REACTION: Nausea,weakness    Family History  Problem Relation Age of Onset  . Cancer Father        Prostate  . Alzheimer's disease Mother     BP (!) 158/78   Pulse 82   Temp 98.5 F (36.9 C)   Wt 222 lb 3.2 oz (100.8 kg)   SpO2 98%   BMI 34.80 kg/m   Review of Systems She also has left lower back pain.  She has intermitt mild tingling of the left forearm, but no pain there.      Objective:   Physical Exam VITAL SIGNS:  See vs page GENERAL: no distress LUNGS:  Clear to auscultation.  Ext: trace bilat leg edema.   Lower back: nontender. Neuro: sensation is intact to touch on the LUE.     Lab Results  Component Value Date   CREATININE 1.27 (H) 01/09/2018   BUN 14 01/09/2018   NA 140 01/09/2018   K 3.4 (L) 01/09/2018   CL 100 01/09/2018   CO2 30 01/09/2018       Assessment & Plan:  Vit-D def: recheck today Edema: mild HTN: she needs increased rx Renal failure: recheck today Hypokalemia: recheck today Paresthesias: check B-12.  Will ref neurol if persists.  Chronic dyspnea: unchanged.  Patient Instructions  blood tests and x-rays are requested for you today.  We'll let you know about the results.   Based on the blood tests, we adjust your meds to help the blood pressure.  The preferred option is to increase the losartan.  We'll do that if we can.  We may have to address the swelling after that.   Also, if the blood tests don't tell us a cause for the tingling, you should consider seeing a neurologist for that.   Please come back for a follow-up appointment in 2 weeks.

## 2018-04-25 LAB — PTH, INTACT AND CALCIUM
CALCIUM: 9.8 mg/dL (ref 8.6–10.4)
PTH: 80 pg/mL — ABNORMAL HIGH (ref 14–64)

## 2018-05-11 ENCOUNTER — Other Ambulatory Visit: Payer: Self-pay | Admitting: Endocrinology

## 2018-05-19 ENCOUNTER — Encounter: Payer: Self-pay | Admitting: Endocrinology

## 2018-05-19 ENCOUNTER — Ambulatory Visit (INDEPENDENT_AMBULATORY_CARE_PROVIDER_SITE_OTHER): Payer: BLUE CROSS/BLUE SHIELD | Admitting: Endocrinology

## 2018-05-19 VITALS — BP 138/72 | HR 84 | Ht 67.0 in | Wt 222.0 lb

## 2018-05-19 DIAGNOSIS — M109 Gout, unspecified: Secondary | ICD-10-CM | POA: Diagnosis not present

## 2018-05-19 MED ORDER — COLCHICINE 0.6 MG PO CAPS: ORAL_CAPSULE | ORAL | 2 refills | Status: DC

## 2018-05-19 NOTE — Progress Notes (Signed)
Subjective:    Patient ID: Jocelyn Ford, female    DOB: January 09, 1951, 67 y.o.   MRN: [id number]  HPI 6 days of moderate pain at the right great toe MTP, and assoc swelling.  She has not recently taken the allopurinol.   Past Medical History:  Diagnosis Date  . Acute gouty arthropathy   . Allergic rhinitis, cause unspecified   . Arthritis   . Dizziness and giddiness   . Dysuria   . Esophageal reflux   . Iron deficiency anemia, unspecified   . Nonspecific abnormal electrocardiogram (ECG) (EKG)   . Osteoporosis, unspecified   . Pure hypercholesterolemia   . Routine general medical examination at a health care facility   . Unspecified essential hypertension     Past Surgical History:  Procedure Laterality Date  . ABDOMINAL HYSTERECTOMY    . LEFT HEART CATHETERIZATION WITH CORONARY ANGIOGRAM N/A 01/28/2015   Procedure: LEFT HEART CATHETERIZATION WITH CORONARY ANGIOGRAM;  Surgeon: Candee Furbish, MD;  Location: [facility] CATH LAB;  Service: Cardiovascular;  Laterality: N/A;    Social History   Socioeconomic History  . Marital status: Divorced    Spouse name: n/a  . Number of children: 1  . Years of education: 34  . Highest education level: Not on file  Occupational History  . Occupation: IT sales professional: [employer]  . Financial resource strain: Not on file  . Food insecurity:    Worry: Not on file    Inability: Not on file  . Transportation needs:    Medical: Not on file    Non-medical: Not on file  Tobacco Use  . Smoking status: Former Research scientist (life sciences)  . Smokeless tobacco: Never Used  Substance and Sexual Activity  . Alcohol use: No    Alcohol/week: 0.0 oz  . Drug use: No  . Sexual activity: Never  Lifestyle  . Physical activity:    Days per week: Not on file    Minutes per session: Not on file  . Stress: Not on file  Relationships  . Social connections:    Talks on phone: Not on file    Gets together: Not on file    Attends religious  service: Not on file    Active member of club or organization: Not on file    Attends meetings of clubs or organizations: Not on file    Relationship status: Not on file  . Intimate partner violence:    Fear of current or ex partner: Not on file    Emotionally abused: Not on file    Physically abused: Not on file    Forced sexual activity: Not on file  Other Topics Concern  . Not on file  Social History Narrative   Lives alone.  Her daughter lives nearby.    Current Outpatient Medications on File Prior to Visit  Medication Sig Dispense Refill  . allopurinol (ZYLOPRIM) 300 MG tablet TAKE 1 TABLET DAILY 90 tablet 1  . amLODipine (NORVASC) 2.5 MG tablet TAKE 1 TABLET BY MOUTH EVERY DAY 90 tablet 1  . aspirin EC 81 MG tablet Take 1 tablet (81 mg total) by mouth daily. 90 tablet 3  . atenolol (TENORMIN) 25 MG tablet TAKE 1 TABLET TWICE A DAY 180 tablet 2  . azelastine (ASTELIN) 0.1 % nasal spray PLACE 1 SPRAY INTO BOTH NOSTRILS 2 TIMES DAILY. USE IN EACH NOSTRIL AS DIRECTED 30 mL 0  . furosemide (LASIX) 40 MG tablet TAKE 1 TABLET BY  MOUTH EVERY DAY 30 tablet 3  . Guaifenesin (MUCINEX MAXIMUM STRENGTH) 1200 MG TB12 Take 1 tablet (1,200 mg total) by mouth every 12 (twelve) hours as needed. 14 tablet 1  . ipratropium (ATROVENT) 0.03 % nasal spray Place 2 sprays into both nostrils 2 (two) times daily. 30 mL 0  . KLOR-CON M20 20 MEQ tablet (INS ONLY PAYS FOR 5 PER DAY)TAKE 3 TABLETS (60 MEQ TOTAL) BY MOUTH 3 (THREE) TIMES DAILY. 270 tablet 0  . losartan (COZAAR) 50 MG tablet Take 1 tablet (50 mg total) by mouth daily. 90 tablet 3  . Multiple Vitamin (MULTIVITAMIN) capsule Take 2 capsules by mouth daily.    Marland Kitchen omeprazole (PRILOSEC) 40 MG capsule TAKE 1 CAPSULE DAILY 90 capsule 2  . rosuvastatin (CRESTOR) 10 MG tablet Take 1 tablet (10 mg total) by mouth daily. 90 tablet 3  . triamterene-hydrochlorothiazide (MAXZIDE-25) 37.5-25 MG tablet TAKE 1/2 TABLET BY MOUTH ONCE A DAY 30 tablet 2   No current  facility-administered medications on file prior to visit.     Allergies  Allergen Reactions  . Doxycycline   . Nifedipine     REACTION: Nausea,weakness    Family History  Problem Relation Age of Onset  . Cancer Father        Prostate  . Alzheimer's disease Mother     BP 138/72 (BP Location: Left Arm, Patient Position: Sitting, Cuff Size: Normal)   Pulse 84   Ht 5\' 7"  (1.702 m)   Wt 222 lb (100.7 kg)   SpO2 96%   BMI 34.77 kg/m   Review of Systems Denies fever.  Leg swelling is much better.  Tingling is much better.      Objective:   Physical Exam Right MTP: slight swell/tend/erythema/warmth Ext: trace bilat leg edema      Assessment & Plan:  Gout: worse HTN: well-controlled Dyslipidemia: there is an interaction with colchicine Paresthesias: improved.  Re-eval if worsens  Patient Instructions  I have sent a prescription to your pharmacy, for colchicine, to help the gout now. When the gout is better, you can resume the allopurinol. Due to an interaction, skip the rosuvastatin on any day you take the colchicine Please continue the same medications for blood pressure.  Please come back for a regular physical appointment in 7 months (after 12/05/18)

## 2018-05-19 NOTE — Patient Instructions (Addendum)
I have sent a prescription to your pharmacy, for colchicine, to help the gout now. When the gout is better, you can resume the allopurinol. Due to an interaction, skip the rosuvastatin on any day you take the colchicine Please continue the same medications for blood pressure.  Please come back for a regular physical appointment in 7 months (after 12/05/18)

## 2018-06-06 ENCOUNTER — Other Ambulatory Visit: Payer: Self-pay | Admitting: Endocrinology

## 2018-06-10 ENCOUNTER — Other Ambulatory Visit: Payer: Self-pay | Admitting: Endocrinology

## 2018-07-11 ENCOUNTER — Other Ambulatory Visit: Payer: Self-pay | Admitting: Endocrinology

## 2018-08-01 ENCOUNTER — Encounter: Payer: Self-pay | Admitting: Endocrinology

## 2018-08-01 ENCOUNTER — Ambulatory Visit (INDEPENDENT_AMBULATORY_CARE_PROVIDER_SITE_OTHER): Payer: BLUE CROSS/BLUE SHIELD | Admitting: Endocrinology

## 2018-08-01 ENCOUNTER — Telehealth: Payer: Self-pay | Admitting: Endocrinology

## 2018-08-01 ENCOUNTER — Other Ambulatory Visit (INDEPENDENT_AMBULATORY_CARE_PROVIDER_SITE_OTHER): Payer: BLUE CROSS/BLUE SHIELD

## 2018-08-01 VITALS — BP 137/70 | HR 84 | Ht 67.0 in | Wt 218.0 lb

## 2018-08-01 DIAGNOSIS — M810 Age-related osteoporosis without current pathological fracture: Secondary | ICD-10-CM

## 2018-08-01 DIAGNOSIS — M109 Gout, unspecified: Secondary | ICD-10-CM

## 2018-08-01 DIAGNOSIS — M7989 Other specified soft tissue disorders: Secondary | ICD-10-CM | POA: Diagnosis not present

## 2018-08-01 DIAGNOSIS — D509 Iron deficiency anemia, unspecified: Secondary | ICD-10-CM

## 2018-08-01 DIAGNOSIS — Z0000 Encounter for general adult medical examination without abnormal findings: Secondary | ICD-10-CM

## 2018-08-01 DIAGNOSIS — E213 Hyperparathyroidism, unspecified: Secondary | ICD-10-CM | POA: Diagnosis not present

## 2018-08-01 LAB — CBC WITH DIFFERENTIAL/PLATELET
BASOS PCT: 1 % (ref 0.0–3.0)
Basophils Absolute: 0 10*3/uL (ref 0.0–0.1)
EOS ABS: 0.2 10*3/uL (ref 0.0–0.7)
Eosinophils Relative: 4.9 % (ref 0.0–5.0)
HCT: 35.1 % — ABNORMAL LOW (ref 36.0–46.0)
HEMOGLOBIN: 11.6 g/dL — AB (ref 12.0–15.0)
LYMPHS ABS: 1.9 10*3/uL (ref 0.7–4.0)
Lymphocytes Relative: 40.3 % (ref 12.0–46.0)
MCHC: 33.1 g/dL (ref 30.0–36.0)
MCV: 83.4 fl (ref 78.0–100.0)
Monocytes Absolute: 0.4 10*3/uL (ref 0.1–1.0)
Monocytes Relative: 8 % (ref 3.0–12.0)
NEUTROS PCT: 45.8 % (ref 43.0–77.0)
Neutro Abs: 2.1 10*3/uL (ref 1.4–7.7)
Platelets: 374 10*3/uL (ref 150.0–400.0)
RBC: 4.21 Mil/uL (ref 3.87–5.11)
RDW: 16.1 % — AB (ref 11.5–15.5)
WBC: 4.6 10*3/uL (ref 4.0–10.5)

## 2018-08-01 LAB — HEPATIC FUNCTION PANEL
ALT: 9 U/L (ref 0–35)
AST: 12 U/L (ref 0–37)
Albumin: 4.3 g/dL (ref 3.5–5.2)
Alkaline Phosphatase: 70 U/L (ref 39–117)
BILIRUBIN DIRECT: 0 mg/dL (ref 0.0–0.3)
TOTAL PROTEIN: 8.3 g/dL (ref 6.0–8.3)
Total Bilirubin: 0.4 mg/dL (ref 0.2–1.2)

## 2018-08-01 LAB — LIPID PANEL
CHOLESTEROL: 185 mg/dL (ref 0–200)
HDL: 36.9 mg/dL — ABNORMAL LOW (ref >39.00)
LDL Cholesterol: 120 mg/dL — ABNORMAL HIGH (ref 0–99)
NonHDL: 148.53
Total CHOL/HDL Ratio: 5
Triglycerides: 142 mg/dL (ref 0.0–149.0)
VLDL: 28.4 mg/dL (ref 0.0–40.0)

## 2018-08-01 LAB — BASIC METABOLIC PANEL
BUN: 19 mg/dL (ref 6–23)
CALCIUM: 10.1 mg/dL (ref 8.4–10.5)
CO2: 33 mEq/L — ABNORMAL HIGH (ref 19–32)
CREATININE: 1.43 mg/dL — AB (ref 0.40–1.20)
Chloride: 97 mEq/L (ref 96–112)
GFR: 47.29 mL/min — AB (ref >60.00)
Glucose, Bld: 94 mg/dL (ref 70–99)
POTASSIUM: 3.2 meq/L — AB (ref 3.5–5.1)
Sodium: 137 mEq/L (ref 135–145)

## 2018-08-01 LAB — URIC ACID: URIC ACID, SERUM: 5.2 mg/dL (ref 2.4–7.0)

## 2018-08-01 LAB — IBC PANEL
IRON: 43 ug/dL (ref 42–145)
Saturation Ratios: 13.5 % — ABNORMAL LOW (ref 20.0–50.0)
TRANSFERRIN: 228 mg/dL (ref 212.0–360.0)

## 2018-08-01 LAB — SEDIMENTATION RATE: SED RATE: 99 mm/h — AB (ref 0–30)

## 2018-08-01 MED ORDER — COLCHICINE 0.6 MG PO TABS: ORAL_TABLET | ORAL | 1 refills | Status: DC

## 2018-08-01 NOTE — Progress Notes (Signed)
Subjective:    Patient ID: Jocelyn Ford, female    DOB: 1951-09-27, 67 y.o.   MRN: [id number]  HPI 3 days of moderate pain at the right foot/leg, and assoc swelling.   Past Medical History:  Diagnosis Date  . Acute gouty arthropathy   . Allergic rhinitis, cause unspecified   . Arthritis   . Dizziness and giddiness   . Dysuria   . Esophageal reflux   . Iron deficiency anemia, unspecified   . Nonspecific abnormal electrocardiogram (ECG) (EKG)   . Osteoporosis, unspecified   . Pure hypercholesterolemia   . Routine general medical examination at a health care facility   . Unspecified essential hypertension     Past Surgical History:  Procedure Laterality Date  . ABDOMINAL HYSTERECTOMY    . LEFT HEART CATHETERIZATION WITH CORONARY ANGIOGRAM N/A 01/28/2015   Procedure: LEFT HEART CATHETERIZATION WITH CORONARY ANGIOGRAM;  Surgeon: Candee Furbish, MD;  Location: [facility] CATH LAB;  Service: Cardiovascular;  Laterality: N/A;    Social History   Socioeconomic History  . Marital status: Divorced    Spouse name: n/a  . Number of children: 1  . Years of education: 35  . Highest education level: Not on file  Occupational History  . Occupation: IT sales professional: [employer]  . Financial resource strain: Not on file  . Food insecurity:    Worry: Not on file    Inability: Not on file  . Transportation needs:    Medical: Not on file    Non-medical: Not on file  Tobacco Use  . Smoking status: Former Research scientist (life sciences)  . Smokeless tobacco: Never Used  Substance and Sexual Activity  . Alcohol use: No    Alcohol/week: 0.0 standard drinks  . Drug use: No  . Sexual activity: Never  Lifestyle  . Physical activity:    Days per week: Not on file    Minutes per session: Not on file  . Stress: Not on file  Relationships  . Social connections:    Talks on phone: Not on file    Gets together: Not on file    Attends religious service: Not on file    Active member  of club or organization: Not on file    Attends meetings of clubs or organizations: Not on file    Relationship status: Not on file  . Intimate partner violence:    Fear of current or ex partner: Not on file    Emotionally abused: Not on file    Physically abused: Not on file    Forced sexual activity: Not on file  Other Topics Concern  . Not on file  Social History Narrative   Lives alone.  Her daughter lives nearby.    Current Outpatient Medications on File Prior to Visit  Medication Sig Dispense Refill  . allopurinol (ZYLOPRIM) 300 MG tablet TAKE 1 TABLET DAILY 90 tablet 1  . amLODipine (NORVASC) 2.5 MG tablet TAKE 1 TABLET BY MOUTH EVERY DAY 90 tablet 1  . aspirin EC 81 MG tablet Take 1 tablet (81 mg total) by mouth daily. 90 tablet 3  . atenolol (TENORMIN) 25 MG tablet TAKE 1 TABLET TWICE A DAY 180 tablet 2  . azelastine (ASTELIN) 0.1 % nasal spray PLACE 1 SPRAY INTO BOTH NOSTRILS 2 TIMES DAILY. USE IN EACH NOSTRIL AS DIRECTED 30 mL 0  . furosemide (LASIX) 40 MG tablet TAKE 1 TABLET BY MOUTH EVERY DAY 90 tablet 1  . Guaifenesin (  MUCINEX MAXIMUM STRENGTH) 1200 MG TB12 Take 1 tablet (1,200 mg total) by mouth every 12 (twelve) hours as needed. 14 tablet 1  . ipratropium (ATROVENT) 0.03 % nasal spray Place 2 sprays into both nostrils 2 (two) times daily. 30 mL 0  . KLOR-CON M20 20 MEQ tablet TAKE 3 TABLETS BY MOUTH 3 TIMES DAILY. -INS WILL ONLY PAY FOR 5 A DAY- 150 tablet 1  . losartan (COZAAR) 50 MG tablet Take 1 tablet (50 mg total) by mouth daily. 90 tablet 3  . Multiple Vitamin (MULTIVITAMIN) capsule Take 2 capsules by mouth daily.    Marland Kitchen omeprazole (PRILOSEC) 40 MG capsule TAKE 1 CAPSULE DAILY 90 capsule 2  . triamterene-hydrochlorothiazide (MAXZIDE-25) 37.5-25 MG tablet TAKE 1/2 TABLET BY MOUTH ONCE A DAY 15 tablet 5   No current facility-administered medications on file prior to visit.     Allergies  Allergen Reactions  . Doxycycline   . Nifedipine     REACTION:  Nausea,weakness    Family History  Problem Relation Age of Onset  . Cancer Father        Prostate  . Alzheimer's disease Mother     BP 137/70   Pulse 84   Ht 5\' 7"  (1.702 m)   Wt 218 lb (98.9 kg)   SpO2 95%   BMI 34.14 kg/m    Review of Systems Denies sob and numbness.      Objective:   Physical Exam VITAL SIGNS:  See vs page GENERAL: no distress Right ankle and foot: slight swelling, but no tend/erythema/warmth.   Right calf: no swell/tend/erythema/warmth.        Assessment & Plan:  Foot pain, worse.  prob due to gout.   Hypokalemia: recheck today  Patient Instructions  I have sent a prescription to your pharmacy, for gout. blood tests are requested for you today.  We'll let you know about the results. You will get better much faster if you elevate your foot above the rest of your body. I hope you feel better soon.  If you don't feel better by next week, please call back.  Please call sooner if you get worse.

## 2018-08-01 NOTE — Telephone Encounter (Signed)
745 tomorrow morning

## 2018-08-01 NOTE — Telephone Encounter (Signed)
[facility] called about pt having Pain in right foot and right leg need to be seen in the next 24 hour per access nurse/ [facility]/no sob and no chest pain. Pt right foot is swollen and no trama.  Call pt @ [phone]. Thank you!

## 2018-08-01 NOTE — Patient Instructions (Signed)
I have sent a prescription to your pharmacy, for gout. blood tests are requested for you today.  We'll let you know about the results. You will get better much faster if you elevate your foot above the rest of your body. I hope you feel better soon.  If you don't feel better by next week, please call back.  Please call sooner if you get worse.

## 2018-08-01 NOTE — Telephone Encounter (Signed)
Can she be worked in today please advise

## 2018-08-01 NOTE — Telephone Encounter (Signed)
Pt called back about this. The patient asked if there was a way for her to be worked in today. She was hoping to get a note to be out of work today and tomorrow. Without being seen then she will not be able to have a note for work. Please advise thanks.

## 2018-08-01 NOTE — Telephone Encounter (Signed)
Please schedule

## 2018-08-01 NOTE — Telephone Encounter (Signed)
Ok, 4:15 PM today

## 2018-08-02 LAB — URINALYSIS, ROUTINE W REFLEX MICROSCOPIC
Bilirubin Urine: NEGATIVE
HGB URINE DIPSTICK: NEGATIVE
KETONES UR: NEGATIVE
LEUKOCYTES UA: NEGATIVE
NITRITE: NEGATIVE
RBC / HPF: NONE SEEN (ref 0–?)
Specific Gravity, Urine: 1.01 (ref 1.000–1.030)
TOTAL PROTEIN, URINE-UPE24: NEGATIVE
UROBILINOGEN UA: 0.2 (ref 0.0–1.0)
Urine Glucose: NEGATIVE
pH: 7 (ref 5.0–8.0)

## 2018-08-02 LAB — D-DIMER, QUANTITATIVE (NOT AT ARMC): D DIMER QUANT: 0.47 ug{FEU}/mL (ref <0.50)

## 2018-08-02 LAB — PTH, INTACT AND CALCIUM
CALCIUM: 9.7 mg/dL (ref 8.6–10.4)
PTH: 86 pg/mL — AB (ref 14–64)

## 2018-08-02 LAB — VITAMIN D 25 HYDROXY (VIT D DEFICIENCY, FRACTURES): VITD: 41.54 ng/mL (ref 30.00–100.00)

## 2018-08-02 LAB — TSH: TSH: 2.27 u[IU]/mL (ref 0.35–4.50)

## 2018-08-03 ENCOUNTER — Telehealth: Payer: Self-pay

## 2018-08-03 MED ORDER — PITAVASTATIN CALCIUM 1 MG PO TABS: 1.0000 mg | ORAL_TABLET | Freq: Every day | ORAL | 11 refills | Status: DC

## 2018-08-03 NOTE — Telephone Encounter (Signed)
Patient called today to report she is taking potassium 2 times daily and rosuvastatin she is not taking pharmacy stated she is supposed to be taking potassium 5 times daily- patient requesting this go to the nurse and that the nurse call her back to discuss

## 2018-08-03 NOTE — Telephone Encounter (Signed)
I have sent a prescription to your pharmacy, to change rosuvastatin to livalo.   Due to an interaction, do not take this along with colchicine Also, I need to know how many potassium pills you are actually taking

## 2018-08-03 NOTE — Telephone Encounter (Signed)
I have called patient & explained prescription for potassium. Patient wanted to switch from rosuvastatin because she feels it's making her bones ache. Please advise on alternative?

## 2018-08-03 NOTE — Addendum Note (Signed)
Addended by: Renato Shin on: 08/03/2018 05:56 PM   Modules accepted: Orders

## 2018-08-07 ENCOUNTER — Telehealth: Payer: Self-pay | Admitting: Endocrinology

## 2018-08-07 NOTE — Telephone Encounter (Signed)
LVM for patient to call back & let us know how many potassium pills she is taking. We also needed to make sure she didn't continue to take colchicine.

## 2018-08-07 NOTE — Telephone Encounter (Signed)
Patient stated that she needed Dr Loanne Drilling to know that she is taking 3 pills per day of Potasium   Just FYI

## 2018-08-07 NOTE — Telephone Encounter (Signed)
Ok, please continue 3/day

## 2018-08-07 NOTE — Telephone Encounter (Signed)
I LVM that 3 pills per day were fine.

## 2018-08-09 ENCOUNTER — Other Ambulatory Visit: Payer: Self-pay | Admitting: Endocrinology

## 2018-08-09 ENCOUNTER — Other Ambulatory Visit: Payer: Self-pay

## 2018-08-09 MED ORDER — PITAVASTATIN CALCIUM 1 MG PO TABS: 1.0000 mg | ORAL_TABLET | Freq: Every day | ORAL | 11 refills | Status: DC

## 2018-08-10 NOTE — Progress Notes (Signed)
Office Visit Note  Patient: Jocelyn Ford             Date of Birth: January 01, 1951           MRN: [id number]             PCP: Renato Shin, MD Referring: Renato Shin, MD Visit Date: 08/11/2018 Occupation: Lab inspector  Subjective:  Gout flare  History of Present Illness: Jocelyn Ford is a 67 y.o. female with history of gout.  According to patient she was diagnosed with gout about 20 years ago when she is developed pain and swelling in her right first toe.  She states she was placed on allopurinol at the time and did well for several years.  She did not have any flares she stopped allopurinol about a year ago.  She states 2 months ago she developed pain and swelling in her right great toe and about 2 weeks later it moved into her left great toe as well.  She had old prescription of allopurinol which she started herself.  And went to see her PCP who added colchicine.  She states she took about 4 tablets of colchicine and the symptoms resolved.  She is doing better now and continue with the allopurinol.  None of the other joints are painful. Activities of Daily Living:  Patient reports morning stiffness for 0  minutes.   Patient Denies nocturnal pain.  Difficulty dressing/grooming: Denies Difficulty climbing stairs: Denies Difficulty getting out of chair: Denies Difficulty using hands for taps, buttons, cutlery, and/or writing: Denies  Review of Systems  Constitutional: Positive for fatigue. Negative for night sweats, weight gain and weight loss.  HENT: Negative for mouth sores, trouble swallowing, trouble swallowing, mouth dryness and nose dryness.   Eyes: Negative for pain, redness, visual disturbance and dryness.  Respiratory: Negative for cough, hemoptysis, shortness of breath and difficulty breathing.   Cardiovascular: Negative for chest pain, palpitations, hypertension, irregular heartbeat and swelling in legs/feet.  Gastrointestinal: Negative for blood in stool,  constipation and diarrhea.  Endocrine: Negative for increased urination.  Genitourinary: Negative for painful urination and vaginal dryness.  Musculoskeletal: Positive for arthralgias, joint pain and joint swelling. Negative for myalgias, muscle weakness, morning stiffness, muscle tenderness and myalgias.  Skin: Negative for color change, pallor, rash, hair loss, nodules/bumps, skin tightness, ulcers and sensitivity to sunlight.  Allergic/Immunologic: Negative for susceptible to infections.  Neurological: Positive for headaches (Tension headaches). Negative for dizziness, numbness, memory loss, night sweats and weakness.  Hematological: Negative for swollen glands.  Psychiatric/Behavioral: Negative for depressed mood and sleep disturbance. The patient is not nervous/anxious.     PMFS History:  Patient Active Problem List   Diagnosis Date Noted  . Leg swelling 08/01/2018  . Paresthesia 04/24/2018  . Asymptomatic menopausal state 12/11/2016  . Hyperparathyroidism ([place]) 11/29/2016  . Dyspnea 08/18/2016  . Chronic tension-type headache, not intractable 01/30/2016  . Dizziness and giddiness 01/07/2016  . Headache 01/07/2016  . Fatigue 01/07/2016  . Renal insufficiency 08/20/2015  . Abnormal stress test   . Chest pain 09/27/2014  . Menopausal state 09/27/2014  . Gout 08/08/2014  . Routine general medical examination at a health care facility 10/03/2013  . Hypokalemia 10/03/2013  . Encounter for long-term (current) use of other medications 08/21/2013  . ACUTE GOUTY ARTHROPATHY 07/18/2009  . Anemia, iron deficiency 11/07/2008  . HYPERCHOLESTEROLEMIA 11/01/2008  . ELECTROCARDIOGRAM, ABNORMAL 11/01/2008  . Essential hypertension 07/28/2007  . ALLERGIC RHINITIS 07/28/2007  . GERD 07/28/2007  . Osteoporosis  07/28/2007    Past Medical History:  Diagnosis Date  . Acute gouty arthropathy   . Allergic rhinitis, cause unspecified   . Arthritis   . Dizziness and giddiness   . Dysuria   .  Esophageal reflux   . Iron deficiency anemia, unspecified   . Nonspecific abnormal electrocardiogram (ECG) (EKG)   . Osteoporosis, unspecified   . Pure hypercholesterolemia   . Routine general medical examination at a health care facility   . Unspecified essential hypertension     Family History  Problem Relation Age of Onset  . Cancer Father        Prostate  . Alzheimer's disease Mother   . Diabetes Sister   . Heart Problems Brother        stent  . Healthy Daughter    Past Surgical History:  Procedure Laterality Date  . ABDOMINAL HYSTERECTOMY    . LEFT HEART CATHETERIZATION WITH CORONARY ANGIOGRAM N/A 01/28/2015   Procedure: LEFT HEART CATHETERIZATION WITH CORONARY ANGIOGRAM;  Surgeon: Candee Furbish, MD;  Location: [facility] CATH LAB;  Service: Cardiovascular;  Laterality: N/A;   Social History   Social History Narrative   Lives alone.  Her daughter lives nearby.    Objective: Vital Signs: BP (!) 159/86 (BP Location: Right Arm, Patient Position: Sitting, Cuff Size: Normal)   Pulse 72   Resp 14   Ht 5' 6.54" (1.69 m)   Wt 218 lb 12.8 oz (99.2 kg)   BMI 34.75 kg/m    Physical Exam  Constitutional: She is oriented to person, place, and time. She appears well-developed and well-nourished.  HENT:  Head: Normocephalic and atraumatic.  Eyes: Conjunctivae and EOM are normal.  Neck: Normal range of motion.  Cardiovascular: Normal rate, regular rhythm, normal heart sounds and intact distal pulses.  Pulmonary/Chest: Effort normal and breath sounds normal.  Abdominal: Soft. Bowel sounds are normal.  Lymphadenopathy:    She has no cervical adenopathy.  Neurological: She is alert and oriented to person, place, and time.  Skin: Skin is warm and dry. Capillary refill takes less than 2 seconds.  Psychiatric: She has a normal mood and affect. Her behavior is normal.  Nursing note and vitals reviewed.    Musculoskeletal Exam: C-spine thoracic lumbar spine good range of motion.  Shoulder  joints elbow joints wrist joint MCPs PIPs DIPs been good range of motion.  She has some DIP thickening consistent with osteoarthritis.  Hip joints knee joints ankles MTPs PIPs were in good range of motion.  She has PIP and MTP thickening in her feet consistent with osteoarthritis.  CDAI Exam: CDAI Score: Not documented Patient Global Assessment: Not documented; Provider Global Assessment: Not documented Swollen: Not documented; Tender: Not documented Joint Exam   Not documented   There is currently no information documented on the homunculus. Go to the Rheumatology activity and complete the homunculus joint exam.  Investigation: Findings:  08/01/18: Vitamin D 41.54, Uric acid 5.2, Sed rate 99, TSH 2.27  Component     Latest Ref Rng & Units 08/01/2018  VITD     30.00 - 100.00 ng/mL 41.54  Uric Acid, Serum     2.4 - 7.0 mg/dL 5.2  Sed Rate     0 - 30 mm/hr 99 (H)  TSH     0.35 - 4.50 uIU/mL 2.27   Imaging: Xr Foot 2 Views Left  Result Date: 08/11/2018 First MTP, all PIP and DIP narrowing was noted.  No erosive changes were noted.  No intertarsal or  tibiotalar joint space narrowing was noted.  A calcaneal spur was noted. Impression: These findings are consistent with osteoarthritis of the foot.  Xr Foot 2 Views Right  Result Date: 08/11/2018 First MTP, all PIP and DIP narrowing was noted.  No erosive changes were noted.  No intertarsal or tibiotalar joint space narrowing was noted.  A calcaneal spur was noted. Impression: These findings are consistent with osteoarthritis of the foot.   Recent Labs: Lab Results  Component Value Date   WBC 4.6 08/01/2018   HGB 11.6 (L) 08/01/2018   PLT 374.0 08/01/2018   NA 137 08/01/2018   K 3.2 (L) 08/01/2018   CL 97 08/01/2018   CO2 33 (H) 08/01/2018   GLUCOSE 94 08/01/2018   BUN 19 08/01/2018   CREATININE 1.43 (H) 08/01/2018   BILITOT 0.4 08/01/2018   ALKPHOS 70 08/01/2018   AST 12 08/01/2018   ALT 9 08/01/2018   PROT 8.3 08/01/2018    ALBUMIN 4.3 08/01/2018   CALCIUM 10.1 08/01/2018   GFRAA 50 (L) 01/06/2013    Speciality Comments: No specialty comments available.  Procedures:  No procedures performed Allergies: Doxycycline and Nifedipine   Assessment / Plan:     Visit Diagnoses: Idiopathic gout of multiple sites, unspecified chronicity - Allopurinol 300 mg po 1 tablet daily 08/01/18: Uric acid 5.2, Sed rate 99.  Patient had recent flare lasting for almost 2 months in her bilateral feet.  She is doing much better after colchicine.  She has no tenderness on palpation over MTPs today.  She does have thickening of bilateral first MTPs.  Detailed counseling regarding gout was provided.  Have advised her to stay on allopurinol 300 mg p.o. daily.  I will also keep her on colchicine 0.6 mg p.o. daily for 1 month and after that she can take it on PRN basis.  Side effects of both medications were discussed.  Dietary modification and pharmacological therapy was discussed at length.  Pain in both feet -she has some osteoarthritic changes in her feet.  Plan: XR Foot 2 Views Right, XR Foot 2 Views Left.  The x-ray of the bilateral feet were consistent with osteoarthritis.  No erosive changes were noted.  Osteoarthritis bilateral hands-she has DIP thickening and mucinous cyst formation.  Joint protection muscle strengthening was discussed.  Essential hypertension-despite multiple medications her blood pressure is a still elevated.  Weight loss diet and exercise was discussed.  Association of gout with heart disease was also discussed.  Hyperparathyroidism ([place])  Osteoporosis, unspecified osteoporosis type, unspecified pathological fracture presence-I have advised her to have a repeat bone density with her PCP.  Renal insufficiency-we will refer to nephrology for evaluation.  Chronic fatigue  History of gastroesophageal reflux (GERD)  History of anemia    Orders: Orders Placed This Encounter  Procedures  . XR Foot 2 Views  Right  . XR Foot 2 Views Left  . Ambulatory referral to Nephrology   Meds ordered this encounter  Medications  . allopurinol (ZYLOPRIM) 300 MG tablet    Sig: Take 1 tablet (300 mg total) by mouth daily.    Dispense:  30 tablet    Refill:  5  . Colchicine (MITIGARE) 0.6 MG CAPS    Sig: Take 0.6 mg by mouth 2 (two) times daily as needed.    Dispense:  60 capsule    Refill:  2    Face-to-face time spent with patient was 50 minutes. Greater than 50% of time was spent in counseling and coordination of  care.  Follow-Up Instructions: Return in about 3 months (around 11/11/2018) for Gout.   Bo Merino, MD  Note - This record has been created using Editor, commissioning.  Chart creation errors have been sought, but may not always  have been located. Such creation errors do not reflect on  the standard of medical care.

## 2018-08-11 ENCOUNTER — Ambulatory Visit (INDEPENDENT_AMBULATORY_CARE_PROVIDER_SITE_OTHER): Payer: Self-pay

## 2018-08-11 ENCOUNTER — Telehealth: Payer: Self-pay | Admitting: *Deleted

## 2018-08-11 ENCOUNTER — Telehealth: Payer: Self-pay | Admitting: Emergency Medicine

## 2018-08-11 ENCOUNTER — Ambulatory Visit (INDEPENDENT_AMBULATORY_CARE_PROVIDER_SITE_OTHER): Payer: BLUE CROSS/BLUE SHIELD | Admitting: Rheumatology

## 2018-08-11 ENCOUNTER — Encounter: Payer: Self-pay | Admitting: Rheumatology

## 2018-08-11 VITALS — BP 159/86 | HR 72 | Resp 14 | Ht 66.54 in | Wt 218.8 lb

## 2018-08-11 DIAGNOSIS — M79671 Pain in right foot: Secondary | ICD-10-CM

## 2018-08-11 DIAGNOSIS — Z862 Personal history of diseases of the blood and blood-forming organs and certain disorders involving the immune mechanism: Secondary | ICD-10-CM

## 2018-08-11 DIAGNOSIS — M79672 Pain in left foot: Secondary | ICD-10-CM

## 2018-08-11 DIAGNOSIS — I10 Essential (primary) hypertension: Secondary | ICD-10-CM

## 2018-08-11 DIAGNOSIS — M1009 Idiopathic gout, multiple sites: Secondary | ICD-10-CM

## 2018-08-11 DIAGNOSIS — R5382 Chronic fatigue, unspecified: Secondary | ICD-10-CM

## 2018-08-11 DIAGNOSIS — N289 Disorder of kidney and ureter, unspecified: Secondary | ICD-10-CM

## 2018-08-11 DIAGNOSIS — Z8719 Personal history of other diseases of the digestive system: Secondary | ICD-10-CM

## 2018-08-11 DIAGNOSIS — M810 Age-related osteoporosis without current pathological fracture: Secondary | ICD-10-CM | POA: Diagnosis not present

## 2018-08-11 DIAGNOSIS — E213 Hyperparathyroidism, unspecified: Secondary | ICD-10-CM

## 2018-08-11 MED ORDER — ALLOPURINOL 300 MG PO TABS: 300.0000 mg | ORAL_TABLET | Freq: Every day | ORAL | 5 refills | Status: DC

## 2018-08-11 MED ORDER — COLCHICINE 0.6 MG PO CAPS: 0.6000 mg | ORAL_CAPSULE | Freq: Two times a day (BID) | ORAL | 2 refills | Status: DC | PRN

## 2018-08-11 NOTE — Telephone Encounter (Signed)
Called and spoke to patient informing her that ROI is not in chart. Informed her that labs have been printed and placed up front for pick up. Along with Release of information form to be completed and signed. Patient states that she will come in Monday around 4pm to complete form. Upon completion, forms are able to be faxed to [facility].

## 2018-08-11 NOTE — Telephone Encounter (Signed)
Pt called stating she needs he labs faxed over to [facility]. Thanks.

## 2018-08-11 NOTE — Telephone Encounter (Signed)
Attempted to contact patient and left message to advise patient X-ray of feet show OA and no damage from gout.

## 2018-08-30 ENCOUNTER — Other Ambulatory Visit: Payer: Self-pay | Admitting: Nephrology

## 2018-08-30 DIAGNOSIS — N189 Chronic kidney disease, unspecified: Secondary | ICD-10-CM

## 2018-09-04 ENCOUNTER — Ambulatory Visit
Admission: RE | Admit: 2018-09-04 | Discharge: 2018-09-04 | Disposition: A | Discharge status: Home / Self Care | Payer: BLUE CROSS/BLUE SHIELD | Source: Ambulatory Visit | Attending: Nephrology | Admitting: Nephrology

## 2018-09-04 DIAGNOSIS — N189 Chronic kidney disease, unspecified: Secondary | ICD-10-CM

## 2018-09-08 ENCOUNTER — Other Ambulatory Visit: Payer: Self-pay | Admitting: Endocrinology

## 2018-09-13 ENCOUNTER — Ambulatory Visit: Payer: BLUE CROSS/BLUE SHIELD | Admitting: Rheumatology

## 2018-10-18 ENCOUNTER — Other Ambulatory Visit: Payer: Self-pay

## 2018-10-18 MED ORDER — ATENOLOL 25 MG PO TABS: 25.0000 mg | ORAL_TABLET | Freq: Two times a day (BID) | ORAL | 4 refills | Status: DC

## 2018-10-31 DIAGNOSIS — M19072 Primary osteoarthritis, left ankle and foot: Secondary | ICD-10-CM

## 2018-10-31 DIAGNOSIS — M19041 Primary osteoarthritis, right hand: Secondary | ICD-10-CM | POA: Insufficient documentation

## 2018-10-31 DIAGNOSIS — M19071 Primary osteoarthritis, right ankle and foot: Secondary | ICD-10-CM | POA: Insufficient documentation

## 2018-10-31 DIAGNOSIS — M19042 Primary osteoarthritis, left hand: Secondary | ICD-10-CM

## 2018-10-31 NOTE — Progress Notes (Deleted)
Office Visit Note  Patient: Jocelyn Ford             Date of Birth: 1951-11-02           MRN: [id number]             PCP: Renato Shin, MD Referring: Renato Shin, MD Visit Date: 11/10/2018 Occupation: @GUAROCC @  Subjective:  No chief complaint on file.   History of Present Illness: Jocelyn Ford is a 67 y.o. female ***   Activities of Daily Living:  Patient reports morning stiffness for *** {minute/hour:19697}.   Patient {ACTIONS;DENIES/REPORTS:21021675::"Denies"} nocturnal pain.  Difficulty dressing/grooming: {ACTIONS;DENIES/REPORTS:21021675::"Denies"} Difficulty climbing stairs: {ACTIONS;DENIES/REPORTS:21021675::"Denies"} Difficulty getting out of chair: {ACTIONS;DENIES/REPORTS:21021675::"Denies"} Difficulty using hands for taps, buttons, cutlery, and/or writing: {ACTIONS;DENIES/REPORTS:21021675::"Denies"}  No Rheumatology ROS completed.   PMFS History:  Patient Active Problem List   Diagnosis Date Noted  . Leg swelling 08/01/2018  . Paresthesia 04/24/2018  . Asymptomatic menopausal state 12/11/2016  . Hyperparathyroidism ([place]) 11/29/2016  . Dyspnea 08/18/2016  . Chronic tension-type headache, not intractable 01/30/2016  . Dizziness and giddiness 01/07/2016  . Headache 01/07/2016  . Fatigue 01/07/2016  . Renal insufficiency 08/20/2015  . Abnormal stress test   . Chest pain 09/27/2014  . Menopausal state 09/27/2014  . Gout 08/08/2014  . Routine general medical examination at a health care facility 10/03/2013  . Hypokalemia 10/03/2013  . Encounter for long-term (current) use of other medications 08/21/2013  . ACUTE GOUTY ARTHROPATHY 07/18/2009  . Anemia, iron deficiency 11/07/2008  . HYPERCHOLESTEROLEMIA 11/01/2008  . ELECTROCARDIOGRAM, ABNORMAL 11/01/2008  . Essential hypertension 07/28/2007  . ALLERGIC RHINITIS 07/28/2007  . GERD 07/28/2007  . Osteoporosis 07/28/2007    Past Medical History:  Diagnosis Date  . Acute gouty arthropathy   .  Allergic rhinitis, cause unspecified   . Arthritis   . Dizziness and giddiness   . Dysuria   . Esophageal reflux   . Iron deficiency anemia, unspecified   . Nonspecific abnormal electrocardiogram (ECG) (EKG)   . Osteoporosis, unspecified   . Pure hypercholesterolemia   . Routine general medical examination at a health care facility   . Unspecified essential hypertension     Family History  Problem Relation Age of Onset  . Cancer Father        Prostate  . Alzheimer's disease Mother   . Diabetes Sister   . Heart Problems Brother        stent  . Healthy Daughter    Past Surgical History:  Procedure Laterality Date  . ABDOMINAL HYSTERECTOMY    . LEFT HEART CATHETERIZATION WITH CORONARY ANGIOGRAM N/A 01/28/2015   Procedure: LEFT HEART CATHETERIZATION WITH CORONARY ANGIOGRAM;  Surgeon: Candee Furbish, MD;  Location: [facility] CATH LAB;  Service: Cardiovascular;  Laterality: N/A;   Social History   Social History Narrative   Lives alone.  Her daughter lives nearby.    Objective: Vital Signs: There were no vitals taken for this visit.   Physical Exam   Musculoskeletal Exam: ***  CDAI Exam: CDAI Score: Not documented Patient Global Assessment: Not documented; Provider Global Assessment: Not documented Swollen: Not documented; Tender: Not documented Joint Exam   Not documented   There is currently no information documented on the homunculus. Go to the Rheumatology activity and complete the homunculus joint exam.  Investigation: No additional findings.  Imaging: No results found.  Recent Labs: Lab Results  Component Value Date   WBC 4.6 08/01/2018   HGB 11.6 (L) 08/01/2018   PLT 374.0 08/01/2018  NA 137 08/01/2018   K 3.2 (L) 08/01/2018   CL 97 08/01/2018   CO2 33 (H) 08/01/2018   GLUCOSE 94 08/01/2018   BUN 19 08/01/2018   CREATININE 1.43 (H) 08/01/2018   BILITOT 0.4 08/01/2018   ALKPHOS 70 08/01/2018   AST 12 08/01/2018   ALT 9 08/01/2018   PROT 8.3 08/01/2018    ALBUMIN 4.3 08/01/2018   CALCIUM 9.7 08/01/2018   CALCIUM 10.1 08/01/2018   GFRAA 50 (L) 01/06/2013    Speciality Comments: No specialty comments available.  Procedures:  No procedures performed Allergies: Doxycycline and Nifedipine   Assessment / Plan:     Visit Diagnoses: No diagnosis found.   Orders: No orders of the defined types were placed in this encounter.  No orders of the defined types were placed in this encounter.   Face-to-face time spent with patient was *** minutes. Greater than 50% of time was spent in counseling and coordination of care.  Follow-Up Instructions: No follow-ups on file.   Bo Merino, MD  Note - This record has been created using Editor, commissioning.  Chart creation errors have been sought, but may not always  have been located. Such creation errors do not reflect on  the standard of medical care.

## 2018-11-02 ENCOUNTER — Ambulatory Visit: Payer: BLUE CROSS/BLUE SHIELD | Admitting: Family Medicine

## 2018-11-02 ENCOUNTER — Encounter: Payer: Self-pay | Admitting: Family Medicine

## 2018-11-02 VITALS — BP 112/60 | HR 74 | Temp 97.9°F | Ht 66.0 in | Wt 210.0 lb

## 2018-11-02 DIAGNOSIS — M109 Gout, unspecified: Secondary | ICD-10-CM | POA: Diagnosis not present

## 2018-11-02 DIAGNOSIS — I10 Essential (primary) hypertension: Secondary | ICD-10-CM | POA: Diagnosis not present

## 2018-11-02 DIAGNOSIS — Z23 Encounter for immunization: Secondary | ICD-10-CM

## 2018-11-02 NOTE — Progress Notes (Signed)
Subjective:    Patient ID: Florette Thai, female    DOB: Feb 09, 1951, 67 y.o.   MRN: [id number]  No chief complaint on file.   HPI Patient was seen today for follow-up of chronic conditions and TOC, previously seen by Dr. Loanne Drilling.  Requesting influenza vaccine.  HTN: -checking BP at home -bp 124/60 this morning. -Monitoring sodium intake. -Not eating sugar.  Increasing intake of fruits, vegetables, nuts. -Drinking 3 bottles of water/day -Taking atenolol 25 mg twice daily, Norvasc 2.5 mg, Lasix 40 mg daily, triamterene-hydrochlorothiazide 37.5-25 mg take one half tab daily, losartan 50 mg.  Gout: -Taking allopurinol -Also with history of arthritis.  Seen by Dr. Dora Sims  Allergies:  Doxycyclin nifedipine-nausea and weakness  Social history: Patient is divorced.  She is employed as a Quarry manager.  Pt endorses former tobacco use.  Patient has a history of hysterectomy 2/2 endometriosis.  Patient has 1 child.  LMP 1977.  Last eye exam 09/2018.  Last CPE 2018.  Past Medical History:  Diagnosis Date  . Acute gouty arthropathy   . Allergic rhinitis, cause unspecified   . Arthritis   . Chronic kidney disease   . Dizziness and giddiness   . Dysuria   . Esophageal reflux   . Heart murmur   . Iron deficiency anemia, unspecified   . Nonspecific abnormal electrocardiogram (ECG) (EKG)   . Osteoporosis, unspecified   . Pure hypercholesterolemia   . Routine general medical examination at a health care facility   . Unspecified essential hypertension     Allergies  Allergen Reactions  . Doxycycline   . Nifedipine     REACTION: Nausea,weakness    ROS General: Denies fever, chills, night sweats, changes in weight, changes in appetite HEENT: Denies headaches, ear pain, changes in vision, rhinorrhea, sore throat CV: Denies CP, palpitations, SOB, orthopnea Pulm: Denies SOB, cough, wheezing GI: Denies abdominal pain, nausea, vomiting, diarrhea, constipation GU: Denies dysuria,  hematuria, frequency, vaginal discharge Msk: Denies muscle cramps, joint pains Neuro: Denies weakness, numbness, tingling Skin: Denies rashes, bruising Psych: Denies depression, anxiety, hallucinations    Objective:    Blood pressure 112/60, pulse 74, temperature 97.9 F (36.6 C), temperature source Oral, height 5\' 6"  (1.676 m), weight 210 lb (95.3 kg), SpO2 96 %.  Gen. Pleasant, well-nourished, in no distress, normal affect   HEENT: [place]/AT, face symmetric, no scleral icterus, PERRLA,  nares patent without drainage, pharynx without erythema or exudate. Lungs: no accessory muscle use, CTAB, no wheezes or rales Cardiovascular: RRR, no m/r/g, no peripheral edema Neuro:  A&Ox3, CN II-XII intact, normal gait Skin:  Warm, no lesions/ rash  Wt Readings from Last 3 Encounters:  11/02/18 210 lb (95.3 kg)  08/11/18 218 lb 12.8 oz (99.2 kg)  08/01/18 218 lb (98.9 kg)    Lab Results  Component Value Date   WBC 4.6 08/01/2018   HGB 11.6 (L) 08/01/2018   HCT 35.1 (L) 08/01/2018   PLT 374.0 08/01/2018   GLUCOSE 94 08/01/2018   CHOL 185 08/01/2018   TRIG 142.0 08/01/2018   HDL 36.90 (L) 08/01/2018   LDLDIRECT 194.6 08/21/2013   LDLCALC 120 (H) 08/01/2018   ALT 9 08/01/2018   AST 12 08/01/2018   NA 137 08/01/2018   K 3.2 (L) 08/01/2018   CL 97 08/01/2018   CREATININE 1.43 (H) 08/01/2018   BUN 19 08/01/2018   CO2 33 (H) 08/01/2018   TSH 2.27 08/01/2018   INR 1.1 (H) 01/22/2015    Assessment/Plan:  Essential hypertension -  Controlled -Continue current medications including atenolol 25 mg, Norvasc 2.5 mg, Lasix 40 mg, Triameterene-hydrochlorothiazide 37.5-25 mg take half a tab per day, losartan 50 mg daily -Continue Klor-Con -Continue checking BP at home -Continue lifestyle modifications  Gout, unspecified cause, unspecified chronicity, unspecified site -Continue allopurinol 300 mg daily -Lifestyle modifications also encouraged  Need for immunization against influenza  - Plan:  Flu vaccine HIGH DOSE PF  Follow-up in the next 3 to 6 months, sooner if needed  Grier Mitts, MD

## 2018-11-02 NOTE — Patient Instructions (Signed)
Managing Your Hypertension Hypertension is commonly called high blood pressure. This is when the force of your blood pressing against the walls of your arteries is too strong. Arteries are blood vessels that carry blood from your heart throughout your body. Hypertension forces the heart to work harder to pump blood, and may cause the arteries to become narrow or stiff. Having untreated or uncontrolled hypertension can cause heart attack, stroke, kidney disease, and other problems. What are blood pressure readings? A blood pressure reading consists of a higher number over a lower number. Ideally, your blood pressure should be below 120/80. The first ("top") number is called the systolic pressure. It is a measure of the pressure in your arteries as your heart beats. The second ("bottom") number is called the diastolic pressure. It is a measure of the pressure in your arteries as the heart relaxes. What does my blood pressure reading mean? Blood pressure is classified into four stages. Based on your blood pressure reading, your health care provider may use the following stages to determine what type of treatment you need, if any. Systolic pressure and diastolic pressure are measured in a unit called mm Hg. Normal  Systolic pressure: below 120.  Diastolic pressure: below 80. Elevated  Systolic pressure: 120-129.  Diastolic pressure: below 80. Hypertension stage 1  Systolic pressure: 130-139.  Diastolic pressure: 80-89. Hypertension stage 2  Systolic pressure: 140 or above.  Diastolic pressure: 90 or above. What health risks are associated with hypertension? Managing your hypertension is an important responsibility. Uncontrolled hypertension can lead to:  A heart attack.  A stroke.  A weakened blood vessel (aneurysm).  Heart failure.  Kidney damage.  Eye damage.  Metabolic syndrome.  Memory and concentration problems.  What changes can I make to manage my  hypertension? Hypertension can be managed by making lifestyle changes and possibly by taking medicines. Your health care provider will help you make a plan to bring your blood pressure within a normal range. Eating and drinking  Eat a diet that is high in fiber and potassium, and low in salt (sodium), added sugar, and fat. An example eating plan is called the DASH (Dietary Approaches to Stop Hypertension) diet. To eat this way: ? Eat plenty of fresh fruits and vegetables. Try to fill half of your plate at each meal with fruits and vegetables. ? Eat whole grains, such as whole wheat pasta, brown rice, or whole grain bread. Fill about one quarter of your plate with whole grains. ? Eat low-fat diary products. ? Avoid fatty cuts of meat, processed or cured meats, and poultry with skin. Fill about one quarter of your plate with lean proteins such as fish, chicken without skin, beans, eggs, and tofu. ? Avoid premade and processed foods. These tend to be higher in sodium, added sugar, and fat.  Reduce your daily sodium intake. Most people with hypertension should eat less than 1,500 mg of sodium a day.  Limit alcohol intake to no more than 1 drink a day for nonpregnant women and 2 drinks a day for men. One drink equals 12 oz of beer, 5 oz of wine, or 1 oz of hard liquor. Lifestyle  Work with your health care provider to maintain a healthy body weight, or to lose weight. Ask what an ideal weight is for you.  Get at least 30 minutes of exercise that causes your heart to beat faster (aerobic exercise) most days of the week. Activities may include walking, swimming, or biking.  Include exercise   to strengthen your muscles (resistance exercise), such as weight lifting, as part of your weekly exercise routine. Try to do these types of exercises for 30 minutes at least 3 days a week.  Do not use any products that contain nicotine or tobacco, such as cigarettes and e-cigarettes. If you need help quitting, ask  your health care provider.  Control any long-term (chronic) conditions you have, such as high cholesterol or diabetes. Monitoring  Monitor your blood pressure at home as told by your health care provider. Your personal target blood pressure may vary depending on your medical conditions, your age, and other factors.  Have your blood pressure checked regularly, as often as told by your health care provider. Working with your health care provider  Review all the medicines you take with your health care provider because there may be side effects or interactions.  Talk with your health care provider about your diet, exercise habits, and other lifestyle factors that may be contributing to hypertension.  Visit your health care provider regularly. Your health care provider can help you create and adjust your plan for managing hypertension. Will I need medicine to control my blood pressure? Your health care provider may prescribe medicine if lifestyle changes are not enough to get your blood pressure under control, and if:  Your systolic blood pressure is 130 or higher.  Your diastolic blood pressure is 80 or higher.  Take medicines only as told by your health care provider. Follow the directions carefully. Blood pressure medicines must be taken as prescribed. The medicine does not work as well when you skip doses. Skipping doses also puts you at risk for problems. Contact a health care provider if:  You think you are having a reaction to medicines you have taken.  You have repeated (recurrent) headaches.  You feel dizzy.  You have swelling in your ankles.  You have trouble with your vision. Get help right away if:  You develop a severe headache or confusion.  You have unusual weakness or numbness, or you feel faint.  You have severe pain in your chest or abdomen.  You vomit repeatedly.  You have trouble breathing. Summary  Hypertension is when the force of blood pumping through  your arteries is too strong. If this condition is not controlled, it may put you at risk for serious complications.  Your personal target blood pressure may vary depending on your medical conditions, your age, and other factors. For most people, a normal blood pressure is less than 120/80.  Hypertension is managed by lifestyle changes, medicines, or both. Lifestyle changes include weight loss, eating a healthy, low-sodium diet, exercising more, and limiting alcohol. This information is not intended to replace advice given to you by your health care provider. Make sure you discuss any questions you have with your health care provider. Document Released: 08/30/2012 Document Revised: 11/03/2016 Document Reviewed: 11/03/2016 Elsevier Interactive Patient Education  2018 Elsevier Inc.  

## 2018-11-03 ENCOUNTER — Other Ambulatory Visit: Payer: Self-pay | Admitting: Endocrinology

## 2018-11-06 ENCOUNTER — Encounter: Payer: Self-pay | Admitting: Family Medicine

## 2018-11-09 ENCOUNTER — Ambulatory Visit: Payer: BLUE CROSS/BLUE SHIELD | Admitting: Physician Assistant

## 2018-11-10 ENCOUNTER — Ambulatory Visit: Payer: BLUE CROSS/BLUE SHIELD | Admitting: Rheumatology

## 2018-12-05 ENCOUNTER — Ambulatory Visit: Payer: BLUE CROSS/BLUE SHIELD | Admitting: Endocrinology

## 2018-12-11 ENCOUNTER — Other Ambulatory Visit: Payer: Self-pay | Admitting: Endocrinology

## 2018-12-25 ENCOUNTER — Ambulatory Visit: Payer: BLUE CROSS/BLUE SHIELD | Admitting: Endocrinology

## 2019-01-07 ENCOUNTER — Other Ambulatory Visit: Payer: Self-pay | Admitting: Endocrinology

## 2019-01-07 NOTE — Telephone Encounter (Signed)
Please forward refill request to pt's new primary care provider.  

## 2019-01-20 ENCOUNTER — Encounter: Payer: Self-pay | Admitting: Adult Health

## 2019-01-20 ENCOUNTER — Other Ambulatory Visit: Payer: Self-pay | Admitting: Endocrinology

## 2019-01-20 ENCOUNTER — Ambulatory Visit (INDEPENDENT_AMBULATORY_CARE_PROVIDER_SITE_OTHER): Payer: Commercial Managed Care - HMO | Admitting: Adult Health

## 2019-01-20 VITALS — BP 158/84 | HR 67 | Temp 98.4°F | Resp 16 | Ht 67.0 in | Wt 219.0 lb

## 2019-01-20 DIAGNOSIS — Z760 Encounter for issue of repeat prescription: Secondary | ICD-10-CM

## 2019-01-20 DIAGNOSIS — J0110 Acute frontal sinusitis, unspecified: Secondary | ICD-10-CM | POA: Diagnosis not present

## 2019-01-20 MED ORDER — POTASSIUM CHLORIDE CRYS ER 20 MEQ PO TBCR: EXTENDED_RELEASE_TABLET | ORAL | 0 refills | Status: DC

## 2019-01-20 MED ORDER — AMOXICILLIN-POT CLAVULANATE 875-125 MG PO TABS: 1.0000 | ORAL_TABLET | Freq: Two times a day (BID) | ORAL | 0 refills | Status: DC

## 2019-01-20 NOTE — Telephone Encounter (Signed)
Please forward refill request to pt's new primary care provider.  

## 2019-01-20 NOTE — Progress Notes (Signed)
Subjective:    Patient ID: Jocelyn Ford, female    DOB: 09/13/1951, 68 y.o.   MRN: [id number]  URI   This is a new problem. The current episode started 1 to 4 weeks ago. There has been no fever. Associated symptoms include congestion, coughing, headaches, rhinorrhea and sinus pain. Pertinent negatives include no ear pain, plugged ear sensation, sore throat, swollen glands, vomiting or wheezing. Treatments tried: mucinex.   She is also out of potassium supplement and would like a short course until she can follow up with PCP  Review of Systems  Constitutional: Negative.   HENT: Positive for congestion, postnasal drip, rhinorrhea, sinus pressure and sinus pain. Negative for ear pain and sore throat.   Respiratory: Positive for cough. Negative for chest tightness and wheezing.   Gastrointestinal: Negative for vomiting.  Musculoskeletal: Negative.   Skin: Negative.   Neurological: Positive for headaches.     Past Medical History:  Diagnosis Date  . Acute gouty arthropathy   . Allergic rhinitis, cause unspecified   . Arthritis   . Chronic kidney disease   . Dizziness and giddiness   . Dysuria   . Esophageal reflux   . Heart murmur   . Iron deficiency anemia, unspecified   . Nonspecific abnormal electrocardiogram (ECG) (EKG)   . Osteoporosis, unspecified   . Pure hypercholesterolemia   . Routine general medical examination at a health care facility   . Unspecified essential hypertension     Social History   Socioeconomic History  . Marital status: Divorced    Spouse name: n/a  . Number of children: 1  . Years of education: 84  . Highest education level: Not on file  Occupational History  . Occupation: IT sales professional: [employer]  . Financial resource strain: Not on file  . Food insecurity:    Worry: Not on file    Inability: Not on file  . Transportation needs:    Medical: Not on file    Non-medical: Not on file  Tobacco  Use  . Smoking status: Former Research scientist (life sciences)  . Smokeless tobacco: Never Used  Substance and Sexual Activity  . Alcohol use: No    Alcohol/week: 0.0 standard drinks  . Drug use: Never  . Sexual activity: Never  Lifestyle  . Physical activity:    Days per week: Not on file    Minutes per session: Not on file  . Stress: Not on file  Relationships  . Social connections:    Talks on phone: Not on file    Gets together: Not on file    Attends religious service: Not on file    Active member of club or organization: Not on file    Attends meetings of clubs or organizations: Not on file    Relationship status: Not on file  . Intimate partner violence:    Fear of current or ex partner: Not on file    Emotionally abused: Not on file    Physically abused: Not on file    Forced sexual activity: Not on file  Other Topics Concern  . Not on file  Social History Narrative   Lives alone.  Her daughter lives nearby.    Past Surgical History:  Procedure Laterality Date  . ABDOMINAL HYSTERECTOMY    . LEFT HEART CATHETERIZATION WITH CORONARY ANGIOGRAM N/A 01/28/2015   Procedure: LEFT HEART CATHETERIZATION WITH CORONARY ANGIOGRAM;  Surgeon: Candee Furbish, MD;  Location: [facility] CATH LAB;  Service:  Cardiovascular;  Laterality: N/A;    Family History  Problem Relation Age of Onset  . Cancer Father        Prostate  . Hypertension Father   . Alzheimer's disease Mother   . Hypertension Mother   . Kidney disease Mother   . Diabetes Sister   . Hypertension Sister   . Heart Problems Brother        stent  . Hypertension Brother   . Healthy Daughter   . Hypertension Sister     Allergies  Allergen Reactions  . Doxycycline   . Nifedipine     REACTION: Nausea,weakness    Current Outpatient Medications on File Prior to Visit  Medication Sig Dispense Refill  . allopurinol (ZYLOPRIM) 300 MG tablet Take 1 tablet (300 mg total) by mouth daily. 30 tablet 5  . amLODipine (NORVASC) 2.5 MG tablet TAKE 1 TABLET  BY MOUTH EVERY DAY 90 tablet 1  . aspirin EC 81 MG tablet Take 1 tablet (81 mg total) by mouth daily. 90 tablet 3  . atenolol (TENORMIN) 25 MG tablet Take 1 tablet (25 mg total) by mouth 2 (two) times daily. 180 tablet 4  . furosemide (LASIX) 40 MG tablet TAKE 1 TABLET BY MOUTH EVERY DAY 90 tablet 1  . Guaifenesin (MUCINEX MAXIMUM STRENGTH) 1200 MG TB12 Take 1 tablet (1,200 mg total) by mouth every 12 (twelve) hours as needed. 14 tablet 1  . KLOR-CON M20 20 MEQ tablet TAKE 3 TABLETS BY MOUTH 3 TIMES DAILY. -INS WILL ONLY PAY FOR 5 A DAY- 150 tablet 1  . losartan (COZAAR) 50 MG tablet Take 1 tablet (50 mg total) by mouth daily. 90 tablet 3  . triamterene-hydrochlorothiazide (MAXZIDE-25) 37.5-25 MG tablet TAKE 1/2 TABLET BY MOUTH ONCE A DAY 15 tablet 5   No current facility-administered medications on file prior to visit.     BP (!) 158/84 (BP Location: Left Arm, Patient Position: Sitting, Cuff Size: Large)   Pulse 67   Temp 98.4 F (36.9 C) (Oral)   Resp 16   Ht 5\' 7"  (1.702 m)   Wt 219 lb (99.3 kg)   SpO2 99%   BMI 34.30 kg/m       Objective:   Physical Exam Vitals signs and nursing note reviewed.  Constitutional:      Appearance: Normal appearance. She is normal weight.  HENT:     Head: Normocephalic and atraumatic.     Right Ear: Tympanic membrane, ear canal and external ear normal. There is no impacted cerumen.     Left Ear: Ear canal and external ear normal. There is no impacted cerumen.     Nose: Congestion and rhinorrhea present.     Right Turbinates: Enlarged and swollen.     Left Turbinates: Enlarged and swollen.     Left Sinus: Frontal sinus tenderness present.     Mouth/Throat:     Mouth: Mucous membranes are dry.     Pharynx: Oropharynx is clear.  Neck:     Musculoskeletal: Normal range of motion.  Cardiovascular:     Rate and Rhythm: Normal rate and regular rhythm.     Pulses: Normal pulses.     Heart sounds: Normal heart sounds.  Pulmonary:     Effort:  Pulmonary effort is normal.     Breath sounds: Normal breath sounds.  Neurological:     General: No focal deficit present.     Mental Status: She is alert and oriented to person, place, and time. Mental  status is at baseline.  Psychiatric:        Mood and Affect: Mood normal.        Behavior: Behavior normal.        Thought Content: Thought content normal.        Judgment: Judgment normal.       Assessment & Plan:  1. Acute non-recurrent frontal sinusitis - Will treat due to time duration and symptoms.  - Follow up with PCP if no improvement  - amoxicillin-clavulanate (AUGMENTIN) 875-125 MG tablet; Take 1 tablet by mouth 2 (two) times daily.  Dispense: 20 tablet; Refill: 0  2. Medication refill - potassium chloride SA (KLOR-CON M20) 20 MEQ tablet; TAKE 3 TABLETS BY MOUTH 3 TIMES DAILY. -INS WILL ONLY PAY FOR 5 A DAY-  Dispense: 90 tablet; Refill: 0   Dorothyann Peng, NP

## 2019-01-23 ENCOUNTER — Other Ambulatory Visit: Payer: Self-pay | Admitting: Family Medicine

## 2019-01-25 ENCOUNTER — Encounter: Payer: Commercial Managed Care - HMO | Admitting: Family Medicine

## 2019-01-26 ENCOUNTER — Ambulatory Visit (INDEPENDENT_AMBULATORY_CARE_PROVIDER_SITE_OTHER): Payer: Medicare Other | Admitting: Family Medicine

## 2019-01-26 ENCOUNTER — Encounter: Payer: Self-pay | Admitting: Family Medicine

## 2019-01-26 VITALS — BP 128/64 | HR 78 | Temp 98.3°F | Wt 221.0 lb

## 2019-01-26 DIAGNOSIS — I10 Essential (primary) hypertension: Secondary | ICD-10-CM

## 2019-01-26 DIAGNOSIS — Z131 Encounter for screening for diabetes mellitus: Secondary | ICD-10-CM

## 2019-01-26 DIAGNOSIS — E213 Hyperparathyroidism, unspecified: Secondary | ICD-10-CM

## 2019-01-26 DIAGNOSIS — Z13220 Encounter for screening for lipoid disorders: Secondary | ICD-10-CM

## 2019-01-26 DIAGNOSIS — Z0000 Encounter for general adult medical examination without abnormal findings: Secondary | ICD-10-CM | POA: Diagnosis not present

## 2019-01-26 DIAGNOSIS — M109 Gout, unspecified: Secondary | ICD-10-CM

## 2019-01-26 LAB — LIPID PANEL
CHOL/HDL RATIO: 6
Cholesterol: 216 mg/dL — ABNORMAL HIGH (ref 0–200)
HDL: 36 mg/dL — ABNORMAL LOW (ref >39.00)
LDL Cholesterol: 159 mg/dL — ABNORMAL HIGH (ref 0–99)
NONHDL: 179.77
TRIGLYCERIDES: 105 mg/dL (ref 0.0–149.0)
VLDL: 21 mg/dL (ref 0.0–40.0)

## 2019-01-26 LAB — CBC WITH DIFFERENTIAL/PLATELET
BASOS PCT: 0.8 % (ref 0.0–3.0)
Basophils Absolute: 0 10*3/uL (ref 0.0–0.1)
EOS ABS: 0.2 10*3/uL (ref 0.0–0.7)
EOS PCT: 4.4 % (ref 0.0–5.0)
HCT: 36.7 % (ref 36.0–46.0)
HEMOGLOBIN: 11.9 g/dL — AB (ref 12.0–15.0)
LYMPHS ABS: 1.8 10*3/uL (ref 0.7–4.0)
Lymphocytes Relative: 38.1 % (ref 12.0–46.0)
MCHC: 32.5 g/dL (ref 30.0–36.0)
MCV: 84.9 fl (ref 78.0–100.0)
MONO ABS: 0.4 10*3/uL (ref 0.1–1.0)
Monocytes Relative: 8.1 % (ref 3.0–12.0)
NEUTROS ABS: 2.3 10*3/uL (ref 1.4–7.7)
Neutrophils Relative %: 48.6 % (ref 43.0–77.0)
PLATELETS: 374 10*3/uL (ref 150.0–400.0)
RBC: 4.32 Mil/uL (ref 3.87–5.11)
RDW: 14.3 % (ref 11.5–15.5)
WBC: 4.8 10*3/uL (ref 4.0–10.5)

## 2019-01-26 LAB — BASIC METABOLIC PANEL
BUN: 24 mg/dL — ABNORMAL HIGH (ref 6–23)
CALCIUM: 10 mg/dL (ref 8.4–10.5)
CO2: 30 mEq/L (ref 19–32)
Chloride: 100 mEq/L (ref 96–112)
Creatinine, Ser: 1.54 mg/dL — ABNORMAL HIGH (ref 0.40–1.20)
GFR: 40.78 mL/min — AB (ref >60.00)
GLUCOSE: 81 mg/dL (ref 70–99)
Potassium: 3.7 mEq/L (ref 3.5–5.1)
SODIUM: 141 meq/L (ref 135–145)

## 2019-01-26 LAB — TSH: TSH: 1.06 u[IU]/mL (ref 0.35–4.50)

## 2019-01-26 LAB — HEMOGLOBIN A1C: HEMOGLOBIN A1C: 6.2 % (ref 4.6–6.5)

## 2019-01-26 NOTE — Progress Notes (Signed)
Subjective:    Jocelyn Ford is a 68 y.o. female who presents for a welcome to Medicare exam.   Pt seen in Saturday clinic for sinusitis.  Given Augmentin.  Bone density scan completed Needs to schedule mammogram. Pap- s/p hysterectomy.  Followed by OB/Gyn.  -Gout: stable.  Taking allopurinol 300 mg daily -HTN: controlled.  Taking norvasc 2.5 mg, atenolol 25 mg BID, lasix 40 mg daily, losartan 50 mg daily, triamterene-HCTZ 37.5-25 mg 1/2 tab daily, and Kdur 20 mEq. -Hyperparathyroidism: Stable.  Last TSH and PTH checked August 2019.  Patient asymptomatic   Cardiac risk factors: advanced age (older than 21 for men, 43 for women), dyslipidemia and hypertension.  Activities of Daily Living  In your present state of health, do you have any difficulty performing the following activities?:  Preparing food and eating?: No Bathing yourself: No Getting dressed: No Using the toilet:No Moving around from place to place: No In the past year have you fallen or had a near fall?:No  Current exercise habits: Planning to start exercising at [facility] in the next wk   Dietary issues discussed: increasing intake of vegetables and water.  Getting plenty of calcium and vitamin d from foods.   Depression Screen (Note: if answer to either of the following is "Yes", then a more complete depression screening is indicated)  Q1: Over the past two weeks, have you felt down, depressed or hopeless?no Q2: Over the past two weeks, have you felt little interest or pleasure in doing things? no   The following portions of the patient's history were reviewed and updated as appropriate: allergies, current medications, past family history, past medical history, past social history, past surgical history and problem list. Review of Systems A comprehensive review of systems was negative.    Objective:     Vision by Snellen chart: right [id number] glasses, pt declines. Blood pressure 128/64, pulse 78,  temperature 98.3 F (36.8 C), temperature source Oral, weight 221 lb (100.2 kg), SpO2 98 %. Body mass index is 34.61 kg/m. BP 128/64 (BP Location: Left Arm, Patient Position: Sitting, Cuff Size: Large)   Pulse 78   Temp 98.3 F (36.8 C) (Oral)   Wt 221 lb (100.2 kg)   SpO2 98%   BMI 34.61 kg/m   General Appearance:    Alert, cooperative, no distress, appears stated age  Head:    Normocephalic, without obvious abnormality, atraumatic  Eyes:    PERRL, conjunctiva/corneas clear, EOM's intact, fundi    benign, both eyes  Ears:    Normal TM's and external ear canals, both ears  Nose:   Nares normal, septum midline, mucosa normal, no drainage    or sinus tenderness  Throat:   Lips, mucosa, and tongue normal; teeth and gums normal  Neck:   Supple, symmetrical, trachea midline, no adenopathy;    thyroid:  no enlargement/tenderness/nodules; no carotid   bruit or JVD  Back:     Symmetric, no curvature, ROM normal, no CVA tenderness  Lungs:     Clear to auscultation bilaterally, respirations unlabored  Chest Wall:    No tenderness or deformity   Heart:    Regular rate and rhythm, S1 and S2 normal, no murmur, rub   or gallop  Abdomen:     Soft, non-tender, bowel sounds active all four quadrants,    no masses, no organomegaly  Extremities:   Extremities normal, atraumatic, no cyanosis or edema  Pulses:   2+ and symmetric all extremities  Skin:  Skin color, texture, turgor normal, no rashes or lesions.  Nevi on neck  Lymph nodes:   Cervical, supraclavicular, and axillary nodes normal  Neurologic:   CNII-XII intact, normal strength, sensation and reflexes    throughout      Assessment:    Pt is a healthy 68 yo female with stable chronic conditions.     Plan:     During the course of the visit the patient was educated and counseled about appropriate screening and preventive services including:   Screening mammography  Diabetes screening  Nutrition counseling   Advanced  directives: states her possessions will go to her daughter.  Pt encouraged to complete HCPOA , POA, and create a will.   Patient Instructions (the written plan) was given to the patient.    Patient's healthcare team members reviewed and updated.  Chronic conditions stable.  Continue current medications.  F/u prn  Grier Mitts, MD

## 2019-01-26 NOTE — Patient Instructions (Addendum)
Preventive Care 42 Years and Older, Female Preventive care refers to lifestyle choices and visits with your health care provider that can promote health and wellness. What does preventive care include?  A yearly physical exam. This is also called an annual well check.  Dental exams once or twice a year.  Routine eye exams. Ask your health care provider how often you should have your eyes checked.  Personal lifestyle choices, including: ? Daily care of your teeth and gums. ? Regular physical activity. ? Eating a healthy diet. ? Avoiding tobacco and drug use. ? Limiting alcohol use. ? Practicing safe sex. ? Taking low-dose aspirin every day. ? Taking vitamin and mineral supplements as recommended by your health care provider. What happens during an annual well check? The services and screenings done by your health care provider during your annual well check will depend on your age, overall health, lifestyle risk factors, and family history of disease. Counseling Your health care provider may ask you questions about your:  Alcohol use.  Tobacco use.  Drug use.  Emotional well-being.  Home and relationship well-being.  Sexual activity.  Eating habits.  History of falls.  Memory and ability to understand (cognition).  Work and work Statistician.  Reproductive health.  Screening You may have the following tests or measurements:  Height, weight, and BMI.  Blood pressure.  Lipid and cholesterol levels. These may be checked every 5 years, or more frequently if you are over 30 years old.  Skin check.  Lung cancer screening. You may have this screening every year starting at age 27 if you have a 30-pack-year history of smoking and currently smoke or have quit within the past 15 years.  Colorectal cancer screening. All adults should have this screening starting at age 33 and continuing until age 46. You will have tests every 1-10 years, depending on your results and the  type of screening test. People at increased risk should start screening at an earlier age. Screening tests may include: ? Guaiac-based fecal occult blood testing. ? Fecal immunochemical test (FIT). ? Stool DNA test. ? Virtual colonoscopy. ? Sigmoidoscopy. During this test, a flexible tube with a tiny camera (sigmoidoscope) is used to examine your rectum and lower colon. The sigmoidoscope is inserted through your anus into your rectum and lower colon. ? Colonoscopy. During this test, a long, thin, flexible tube with a tiny camera (colonoscope) is used to examine your entire colon and rectum.  Hepatitis C blood test.  Hepatitis B blood test.  Sexually transmitted disease (STD) testing.  Diabetes screening. This is done by checking your blood sugar (glucose) after you have not eaten for a while (fasting). You may have this done every 1-3 years.  Bone density scan. This is done to screen for osteoporosis. You may have this done starting at age 37.  Mammogram. This may be done every 1-2 years. Talk to your health care provider about how often you should have regular mammograms. Talk with your health care provider about your test results, treatment options, and if necessary, the need for more tests. Vaccines Your health care provider may recommend certain vaccines, such as:  Influenza vaccine. This is recommended every year.  Tetanus, diphtheria, and acellular pertussis (Tdap, Td) vaccine. You may need a Td booster every 10 years.  Varicella vaccine. You may need this if you have not been vaccinated.  Zoster vaccine. You may need this after age 38.  Measles, mumps, and rubella (MMR) vaccine. You may need at least  one dose of MMR if you were born in 1957 or later. You may also need a second dose.  Pneumococcal 13-valent conjugate (PCV13) vaccine. One dose is recommended after age 32.  Pneumococcal polysaccharide (PPSV23) vaccine. One dose is recommended after age 28.  Meningococcal  vaccine. You may need this if you have certain conditions.  Hepatitis A vaccine. You may need this if you have certain conditions or if you travel or work in places where you may be exposed to hepatitis A.  Hepatitis B vaccine. You may need this if you have certain conditions or if you travel or work in places where you may be exposed to hepatitis B.  Haemophilus influenzae type b (Hib) vaccine. You may need this if you have certain conditions. Talk to your health care provider about which screenings and vaccines you need and how often you need them. This information is not intended to replace advice given to you by your health care provider. Make sure you discuss any questions you have with your health care provider. Document Released: 01/02/2016 Document Revised: 01/26/2018 Document Reviewed: 10/07/2015 Elsevier Interactive Patient Education  2019 [place] 65 Years and Older, Female Preventive care refers to lifestyle choices and visits with your health care provider that can promote health and wellness. What does preventive care include?  A yearly physical exam. This is also called an annual well check.  Dental exams once or twice a year.  Routine eye exams. Ask your health care provider how often you should have your eyes checked.  Personal lifestyle choices, including: ? Daily care of your teeth and gums. ? Regular physical activity. ? Eating a healthy diet. ? Avoiding tobacco and drug use. ? Limiting alcohol use. ? Practicing safe sex. ? Taking low-dose aspirin every day. ? Taking vitamin and mineral supplements as recommended by your health care provider. What happens during an annual well check? The services and screenings done by your health care provider during your annual well check will depend on your age, overall health, lifestyle risk factors, and family history of disease. Counseling Your health care provider may ask you questions about  your:  Alcohol use.  Tobacco use.  Drug use.  Emotional well-being.  Home and relationship well-being.  Sexual activity.  Eating habits.  History of falls.  Memory and ability to understand (cognition).  Work and work Statistician.  Reproductive health.  Screening You may have the following tests or measurements:  Height, weight, and BMI.  Blood pressure.  Lipid and cholesterol levels. These may be checked every 5 years, or more frequently if you are over 43 years old.  Skin check.  Lung cancer screening. You may have this screening every year starting at age 26 if you have a 30-pack-year history of smoking and currently smoke or have quit within the past 15 years.  Colorectal cancer screening. All adults should have this screening starting at age 46 and continuing until age 17. You will have tests every 1-10 years, depending on your results and the type of screening test. People at increased risk should start screening at an earlier age. Screening tests may include: ? Guaiac-based fecal occult blood testing. ? Fecal immunochemical test (FIT). ? Stool DNA test. ? Virtual colonoscopy. ? Sigmoidoscopy. During this test, a flexible tube with a tiny camera (sigmoidoscope) is used to examine your rectum and lower colon. The sigmoidoscope is inserted through your anus into your rectum and lower colon. ? Colonoscopy. During this test, a long,  thin, flexible tube with a tiny camera (colonoscope) is used to examine your entire colon and rectum.  Hepatitis C blood test.  Hepatitis B blood test.  Sexually transmitted disease (STD) testing.  Diabetes screening. This is done by checking your blood sugar (glucose) after you have not eaten for a while (fasting). You may have this done every 1-3 years.  Bone density scan. This is done to screen for osteoporosis. You may have this done starting at age 82.  Mammogram. This may be done every 1-2 years. Talk to your health care  provider about how often you should have regular mammograms. Talk with your health care provider about your test results, treatment options, and if necessary, the need for more tests. Vaccines Your health care provider may recommend certain vaccines, such as:  Influenza vaccine. This is recommended every year.  Tetanus, diphtheria, and acellular pertussis (Tdap, Td) vaccine. You may need a Td booster every 10 years.  Varicella vaccine. You may need this if you have not been vaccinated.  Zoster vaccine. You may need this after age 39.  Measles, mumps, and rubella (MMR) vaccine. You may need at least one dose of MMR if you were born in 1957 or later. You may also need a second dose.  Pneumococcal 13-valent conjugate (PCV13) vaccine. One dose is recommended after age 67.  Pneumococcal polysaccharide (PPSV23) vaccine. One dose is recommended after age 3.  Meningococcal vaccine. You may need this if you have certain conditions.  Hepatitis A vaccine. You may need this if you have certain conditions or if you travel or work in places where you may be exposed to hepatitis A.  Hepatitis B vaccine. You may need this if you have certain conditions or if you travel or work in places where you may be exposed to hepatitis B.  Haemophilus influenzae type b (Hib) vaccine. You may need this if you have certain conditions. Talk to your health care provider about which screenings and vaccines you need and how often you need them. This information is not intended to replace advice given to you by your health care provider. Make sure you discuss any questions you have with your health care provider. Document Released: 01/02/2016 Document Revised: 01/26/2018 Document Reviewed: 10/07/2015 Elsevier Interactive Patient Education  2019 [employer].

## 2019-01-29 LAB — PTH, INTACT AND CALCIUM
CALCIUM: 10 mg/dL (ref 8.6–10.4)
PTH: 59 pg/mL (ref 14–64)

## 2019-02-21 ENCOUNTER — Other Ambulatory Visit: Payer: Self-pay | Admitting: Rheumatology

## 2019-02-21 NOTE — Telephone Encounter (Signed)
ok 

## 2019-02-21 NOTE — Telephone Encounter (Signed)
Last visit: 08/11/18 Next Visit: was due November 2019. Message sent to the front to schedule patient. Labs: 01/26/19 Creat. 1.54 GFR 40 Hgb 11.9  Okay to refill Allopurinol?

## 2019-02-21 NOTE — Telephone Encounter (Signed)
Spoke with patient who stated "she does not want to schedule a follow-up appointment."

## 2019-02-21 NOTE — Telephone Encounter (Signed)
Please schedule patient for a follow up visit. Patient was due November 2019. Thanks!

## 2019-02-25 ENCOUNTER — Other Ambulatory Visit: Payer: Self-pay | Admitting: Rheumatology

## 2019-02-27 ENCOUNTER — Other Ambulatory Visit: Payer: Self-pay | Admitting: Rheumatology

## 2019-03-01 ENCOUNTER — Other Ambulatory Visit: Payer: Self-pay | Admitting: Family Medicine

## 2019-03-01 DIAGNOSIS — Z760 Encounter for issue of repeat prescription: Secondary | ICD-10-CM

## 2019-03-05 ENCOUNTER — Other Ambulatory Visit: Payer: Self-pay | Admitting: Endocrinology

## 2019-03-05 ENCOUNTER — Other Ambulatory Visit: Payer: Self-pay | Admitting: Family Medicine

## 2019-03-05 DIAGNOSIS — Z760 Encounter for issue of repeat prescription: Secondary | ICD-10-CM

## 2019-03-05 NOTE — Telephone Encounter (Signed)
Please forward refill request to pt's new primary care provider.  

## 2019-04-02 ENCOUNTER — Other Ambulatory Visit: Payer: Self-pay | Admitting: Family Medicine

## 2019-04-02 DIAGNOSIS — Z760 Encounter for issue of repeat prescription: Secondary | ICD-10-CM

## 2019-04-16 ENCOUNTER — Other Ambulatory Visit: Payer: Self-pay | Admitting: Family Medicine

## 2019-04-17 ENCOUNTER — Other Ambulatory Visit: Payer: Self-pay | Admitting: Endocrinology

## 2019-05-16 ENCOUNTER — Other Ambulatory Visit: Payer: Self-pay | Admitting: Family Medicine

## 2019-05-16 ENCOUNTER — Other Ambulatory Visit: Payer: Self-pay | Admitting: Endocrinology

## 2019-05-16 NOTE — Telephone Encounter (Signed)
Please forward refill request to pt's new primary care provider.  

## 2019-06-02 ENCOUNTER — Other Ambulatory Visit: Payer: Self-pay | Admitting: Endocrinology

## 2019-06-04 NOTE — Telephone Encounter (Signed)
Please forward refill request to pt's new primary care provider.  

## 2019-06-07 ENCOUNTER — Telehealth: Payer: Self-pay | Admitting: Family Medicine

## 2019-06-07 ENCOUNTER — Other Ambulatory Visit: Payer: Self-pay | Admitting: Endocrinology

## 2019-06-07 NOTE — Telephone Encounter (Signed)
Pt needing refill of triamterene-hydrochlorothiazide (MAXZIDE-25) 37.5-25 MG tablet. Pharmacy sent to the wrong doctor. Pt transferred to Dr. Volanda Napoleon. She is going out of town on Friday 6/19. Please advise.  [id number] [address], [place]. [phone] (Phone) [phone] (Fax)

## 2019-06-08 ENCOUNTER — Other Ambulatory Visit: Payer: Self-pay

## 2019-06-08 MED ORDER — TRIAMTERENE-HCTZ 37.5-25 MG PO TABS: ORAL_TABLET | ORAL | 5 refills | Status: DC

## 2019-06-08 NOTE — Telephone Encounter (Signed)
Rx sent to pharmacy   

## 2019-06-14 ENCOUNTER — Other Ambulatory Visit: Payer: Self-pay | Admitting: Family Medicine

## 2019-07-12 ENCOUNTER — Other Ambulatory Visit: Payer: Self-pay | Admitting: Family Medicine

## 2019-07-16 ENCOUNTER — Other Ambulatory Visit: Payer: Self-pay | Admitting: Endocrinology

## 2019-07-16 NOTE — Telephone Encounter (Signed)
Please forward refill request to pt's new primary care provider.  

## 2019-07-16 NOTE — Telephone Encounter (Signed)
Forwarding to PCP Dr Volanda Napoleon.

## 2019-08-10 ENCOUNTER — Other Ambulatory Visit: Payer: Self-pay | Admitting: Family Medicine

## 2019-09-03 ENCOUNTER — Other Ambulatory Visit: Payer: Self-pay | Admitting: Family Medicine

## 2019-09-03 DIAGNOSIS — Z1231 Encounter for screening mammogram for malignant neoplasm of breast: Secondary | ICD-10-CM

## 2019-09-05 ENCOUNTER — Other Ambulatory Visit: Payer: Self-pay | Admitting: Family Medicine

## 2019-10-11 ENCOUNTER — Other Ambulatory Visit: Payer: Self-pay | Admitting: Family Medicine

## 2019-10-15 NOTE — Telephone Encounter (Signed)
Patient need to schedule an ov for future refills.

## 2019-10-16 ENCOUNTER — Ambulatory Visit: Payer: Medicare Other

## 2019-11-10 ENCOUNTER — Other Ambulatory Visit: Payer: Self-pay | Admitting: Family Medicine

## 2019-12-04 ENCOUNTER — Other Ambulatory Visit: Payer: Self-pay | Admitting: Family Medicine

## 2019-12-04 NOTE — Telephone Encounter (Signed)
Pt needs appointment for further refills 

## 2019-12-05 ENCOUNTER — Ambulatory Visit
Admission: RE | Admit: 2019-12-05 | Discharge: 2019-12-05 | Disposition: A | Discharge status: Home / Self Care | Payer: Medicare Other | Source: Ambulatory Visit | Attending: Family Medicine | Admitting: Family Medicine

## 2019-12-05 ENCOUNTER — Other Ambulatory Visit: Payer: Self-pay

## 2019-12-05 DIAGNOSIS — Z1231 Encounter for screening mammogram for malignant neoplasm of breast: Secondary | ICD-10-CM | POA: Diagnosis not present

## 2019-12-06 ENCOUNTER — Other Ambulatory Visit: Payer: Self-pay | Admitting: Family Medicine

## 2019-12-06 DIAGNOSIS — R928 Other abnormal and inconclusive findings on diagnostic imaging of breast: Secondary | ICD-10-CM

## 2019-12-06 NOTE — Telephone Encounter (Signed)
Pt needs appointment for further refills 

## 2019-12-18 ENCOUNTER — Ambulatory Visit
Admission: RE | Admit: 2019-12-18 | Discharge: 2019-12-18 | Disposition: A | Discharge status: Home / Self Care | Payer: Medicare Other | Source: Ambulatory Visit | Attending: Family Medicine | Admitting: Family Medicine

## 2019-12-18 ENCOUNTER — Other Ambulatory Visit: Payer: Self-pay

## 2019-12-18 DIAGNOSIS — N6321 Unspecified lump in the left breast, upper outer quadrant: Secondary | ICD-10-CM | POA: Diagnosis not present

## 2019-12-18 DIAGNOSIS — N6312 Unspecified lump in the right breast, upper inner quadrant: Secondary | ICD-10-CM | POA: Diagnosis not present

## 2019-12-18 DIAGNOSIS — N6001 Solitary cyst of right breast: Secondary | ICD-10-CM | POA: Diagnosis not present

## 2019-12-18 DIAGNOSIS — R928 Other abnormal and inconclusive findings on diagnostic imaging of breast: Secondary | ICD-10-CM

## 2019-12-18 DIAGNOSIS — R922 Inconclusive mammogram: Secondary | ICD-10-CM | POA: Diagnosis not present

## 2019-12-18 LAB — HM MAMMOGRAPHY

## 2020-01-03 ENCOUNTER — Other Ambulatory Visit: Payer: Self-pay | Admitting: Family Medicine

## 2020-01-03 NOTE — Telephone Encounter (Signed)
Pt needs to schedule appointment for further refills 

## 2020-01-04 ENCOUNTER — Ambulatory Visit: Payer: Medicare Other | Attending: Internal Medicine

## 2020-01-04 DIAGNOSIS — Z20822 Contact with and (suspected) exposure to covid-19: Secondary | ICD-10-CM

## 2020-01-05 LAB — NOVEL CORONAVIRUS, NAA: SARS-CoV-2, NAA: NOT DETECTED

## 2020-01-10 ENCOUNTER — Other Ambulatory Visit: Payer: Self-pay | Admitting: Family Medicine

## 2020-01-13 ENCOUNTER — Other Ambulatory Visit: Payer: Self-pay | Admitting: Family Medicine

## 2020-01-13 ENCOUNTER — Other Ambulatory Visit: Payer: Self-pay | Admitting: Endocrinology

## 2020-01-13 DIAGNOSIS — Z760 Encounter for issue of repeat prescription: Secondary | ICD-10-CM

## 2020-01-14 NOTE — Telephone Encounter (Signed)
Per Dr. Ellison's request, I am forwarding this refill request. Please review and refill if appropriate.  

## 2020-01-14 NOTE — Telephone Encounter (Signed)
Please forward refill request to pt's primary care provider.   

## 2020-01-14 NOTE — Telephone Encounter (Signed)
Please advise 

## 2020-01-15 DIAGNOSIS — M109 Gout, unspecified: Secondary | ICD-10-CM | POA: Diagnosis not present

## 2020-01-15 DIAGNOSIS — D631 Anemia in chronic kidney disease: Secondary | ICD-10-CM | POA: Diagnosis not present

## 2020-01-15 DIAGNOSIS — R7303 Prediabetes: Secondary | ICD-10-CM | POA: Diagnosis not present

## 2020-01-15 DIAGNOSIS — I129 Hypertensive chronic kidney disease with stage 1 through stage 4 chronic kidney disease, or unspecified chronic kidney disease: Secondary | ICD-10-CM | POA: Diagnosis not present

## 2020-01-15 DIAGNOSIS — N189 Chronic kidney disease, unspecified: Secondary | ICD-10-CM | POA: Diagnosis not present

## 2020-01-15 DIAGNOSIS — E213 Hyperparathyroidism, unspecified: Secondary | ICD-10-CM | POA: Diagnosis not present

## 2020-01-15 DIAGNOSIS — N183 Chronic kidney disease, stage 3 unspecified: Secondary | ICD-10-CM | POA: Diagnosis not present

## 2020-01-26 ENCOUNTER — Other Ambulatory Visit: Payer: Self-pay | Admitting: Family Medicine

## 2020-02-07 ENCOUNTER — Encounter: Payer: Self-pay | Admitting: Family Medicine

## 2020-02-13 ENCOUNTER — Other Ambulatory Visit: Payer: Self-pay

## 2020-02-13 ENCOUNTER — Encounter: Payer: Self-pay | Admitting: Family Medicine

## 2020-02-13 ENCOUNTER — Ambulatory Visit (INDEPENDENT_AMBULATORY_CARE_PROVIDER_SITE_OTHER): Payer: Medicare Other | Admitting: Family Medicine

## 2020-02-13 VITALS — BP 140/84 | HR 82 | Temp 97.7°F | Wt 225.0 lb

## 2020-02-13 DIAGNOSIS — Z0000 Encounter for general adult medical examination without abnormal findings: Secondary | ICD-10-CM

## 2020-02-13 DIAGNOSIS — I10 Essential (primary) hypertension: Secondary | ICD-10-CM

## 2020-02-13 DIAGNOSIS — E213 Hyperparathyroidism, unspecified: Secondary | ICD-10-CM

## 2020-02-13 DIAGNOSIS — R635 Abnormal weight gain: Secondary | ICD-10-CM

## 2020-02-13 LAB — LIPID PANEL
Cholesterol: 205 mg/dL — ABNORMAL HIGH (ref 0–200)
HDL: 37.1 mg/dL — ABNORMAL LOW (ref >39.00)
LDL Cholesterol: 146 mg/dL — ABNORMAL HIGH (ref 0–99)
NonHDL: 167.96
Total CHOL/HDL Ratio: 6
Triglycerides: 111 mg/dL (ref 0.0–149.0)
VLDL: 22.2 mg/dL (ref 0.0–40.0)

## 2020-02-13 LAB — BASIC METABOLIC PANEL
BUN: 21 mg/dL (ref 6–23)
CO2: 29 mEq/L (ref 19–32)
Calcium: 10.6 mg/dL — ABNORMAL HIGH (ref 8.4–10.5)
Chloride: 101 mEq/L (ref 96–112)
Creatinine, Ser: 1.66 mg/dL — ABNORMAL HIGH (ref 0.40–1.20)
GFR: 37.28 mL/min — ABNORMAL LOW (ref >60.00)
Glucose, Bld: 100 mg/dL — ABNORMAL HIGH (ref 70–99)
Potassium: 3.7 mEq/L (ref 3.5–5.1)
Sodium: 140 mEq/L (ref 135–145)

## 2020-02-13 LAB — CBC
HCT: 35.8 % — ABNORMAL LOW (ref 36.0–46.0)
Hemoglobin: 11.8 g/dL — ABNORMAL LOW (ref 12.0–15.0)
MCHC: 32.9 g/dL (ref 30.0–36.0)
MCV: 83.9 fl (ref 78.0–100.0)
Platelets: 315 10*3/uL (ref 150.0–400.0)
RBC: 4.27 Mil/uL (ref 3.87–5.11)
RDW: 14.1 % (ref 11.5–15.5)
WBC: 3.7 10*3/uL — ABNORMAL LOW (ref 4.0–10.5)

## 2020-02-13 LAB — TIQ-NTM

## 2020-02-13 LAB — TSH: TSH: 1.39 u[IU]/mL (ref 0.35–4.50)

## 2020-02-13 NOTE — Patient Instructions (Signed)
Preventive Care 38 Years and Older, Female Preventive care refers to lifestyle choices and visits with your health care provider that can promote health and wellness. This includes:  A yearly physical exam. This is also called an annual well check.  Regular dental and eye exams.  Immunizations.  Screening for certain conditions.  Healthy lifestyle choices, such as diet and exercise. What can I expect for my preventive care visit? Physical exam Your health care provider will check:  Height and weight. These may be used to calculate body mass index (BMI), which is a measurement that tells if you are at a healthy weight.  Heart rate and blood pressure.  Your skin for abnormal spots. Counseling Your health care provider may ask you questions about:  Alcohol, tobacco, and drug use.  Emotional well-being.  Home and relationship well-being.  Sexual activity.  Eating habits.  History of falls.  Memory and ability to understand (cognition).  Work and work Statistician.  Pregnancy and menstrual history. What immunizations do I need?  Influenza (flu) vaccine  This is recommended every year. Tetanus, diphtheria, and pertussis (Tdap) vaccine  You may need a Td booster every 10 years. Varicella (chickenpox) vaccine  You may need this vaccine if you have not already been vaccinated. Zoster (shingles) vaccine  You may need this after age 33. Pneumococcal conjugate (PCV13) vaccine  One dose is recommended after age 33. Pneumococcal polysaccharide (PPSV23) vaccine  One dose is recommended after age 72. Measles, mumps, and rubella (MMR) vaccine  You may need at least one dose of MMR if you were born in 1957 or later. You may also need a second dose. Meningococcal conjugate (MenACWY) vaccine  You may need this if you have certain conditions. Hepatitis A vaccine  You may need this if you have certain conditions or if you travel or work in places where you may be exposed  to hepatitis A. Hepatitis B vaccine  You may need this if you have certain conditions or if you travel or work in places where you may be exposed to hepatitis B. Haemophilus influenzae type b (Hib) vaccine  You may need this if you have certain conditions. You may receive vaccines as individual doses or as more than one vaccine together in one shot (combination vaccines). Talk with your health care provider about the risks and benefits of combination vaccines. What tests do I need? Blood tests  Lipid and cholesterol levels. These may be checked every 5 years, or more frequently depending on your overall health.  Hepatitis C test.  Hepatitis B test. Screening  Lung cancer screening. You may have this screening every year starting at age 39 if you have a 30-pack-year history of smoking and currently smoke or have quit within the past 15 years.  Colorectal cancer screening. All adults should have this screening starting at age 36 and continuing until age 15. Your health care provider may recommend screening at age 23 if you are at increased risk. You will have tests every 1-10 years, depending on your results and the type of screening test.  Diabetes screening. This is done by checking your blood sugar (glucose) after you have not eaten for a while (fasting). You may have this done every 1-3 years.  Mammogram. This may be done every 1-2 years. Talk with your health care provider about how often you should have regular mammograms.  BRCA-related cancer screening. This may be done if you have a family history of breast, ovarian, tubal, or peritoneal cancers.  Other tests  Sexually transmitted disease (STD) testing.  Bone density scan. This is done to screen for osteoporosis. You may have this done starting at age 68. Follow these instructions at home: Eating and drinking  Eat a diet that includes fresh fruits and vegetables, whole grains, lean protein, and low-fat dairy products. Limit  your intake of foods with high amounts of sugar, saturated fats, and salt.  Take vitamin and mineral supplements as recommended by your health care provider.  Do not drink alcohol if your health care provider tells you not to drink.  If you drink alcohol: ? Limit how much you have to 0-1 drink a day. ? Be aware of how much alcohol is in your drink. In the [place] one drink equals one 12 oz bottle of beer (355 mL), one 5 oz glass of wine (148 mL), or one 1 oz glass of hard liquor (44 mL). Lifestyle  Take daily care of your teeth and gums.  Stay active. Exercise for at least 30 minutes on 5 or more days each week.  Do not use any products that contain nicotine or tobacco, such as cigarettes, e-cigarettes, and chewing tobacco. If you need help quitting, ask your health care provider.  If you are sexually active, practice safe sex. Use a condom or other form of protection in order to prevent STIs (sexually transmitted infections).  Talk with your health care provider about taking a low-dose aspirin or statin. What's next?  Go to your health care provider once a year for a well check visit.  Ask your health care provider how often you should have your eyes and teeth checked.  Stay up to date on all vaccines. This information is not intended to replace advice given to you by your health care provider. Make sure you discuss any questions you have with your health care provider. Document Revised: 11/30/2018 Document Reviewed: 11/30/2018 Elsevier Patient Education  2020 [employer].  Managing Your Hypertension Hypertension is commonly called high blood pressure. This is when the force of your blood pressing against the walls of your arteries is too strong. Arteries are blood vessels that carry blood from your heart throughout your body. Hypertension forces the heart to work harder to pump blood, and may cause the arteries to become narrow or stiff. Having untreated or uncontrolled  hypertension can cause heart attack, stroke, kidney disease, and other problems. What are blood pressure readings? A blood pressure reading consists of a higher number over a lower number. Ideally, your blood pressure should be below 120/80. The first ("top") number is called the systolic pressure. It is a measure of the pressure in your arteries as your heart beats. The second ("bottom") number is called the diastolic pressure. It is a measure of the pressure in your arteries as the heart relaxes. What does my blood pressure reading mean? Blood pressure is classified into four stages. Based on your blood pressure reading, your health care provider may use the following stages to determine what type of treatment you need, if any. Systolic pressure and diastolic pressure are measured in a unit called mm Hg. Normal  Systolic pressure: below 191.  Diastolic pressure: below 80. Elevated  Systolic pressure: 478-295.  Diastolic pressure: below 80. Hypertension stage 1  Systolic pressure: 621-308.  Diastolic pressure: 65-78. Hypertension stage 2  Systolic pressure: 469 or above.  Diastolic pressure: 90 or above. What health risks are associated with hypertension? Managing your hypertension is an important responsibility. Uncontrolled hypertension can lead to:  A heart attack.  A stroke.  A weakened blood vessel (aneurysm).  Heart failure.  Kidney damage.  Eye damage.  Metabolic syndrome.  Memory and concentration problems. What changes can I make to manage my hypertension? Hypertension can be managed by making lifestyle changes and possibly by taking medicines. Your health care provider will help you make a plan to bring your blood pressure within a normal range. Eating and drinking   Eat a diet that is high in fiber and potassium, and low in salt (sodium), added sugar, and fat. An example eating plan is called the DASH (Dietary Approaches to Stop Hypertension) diet. To eat  this way: ? Eat plenty of fresh fruits and vegetables. Try to fill half of your plate at each meal with fruits and vegetables. ? Eat whole grains, such as whole wheat pasta, brown rice, or whole grain bread. Fill about one quarter of your plate with whole grains. ? Eat low-fat diary products. ? Avoid fatty cuts of meat, processed or cured meats, and poultry with skin. Fill about one quarter of your plate with lean proteins such as fish, chicken without skin, beans, eggs, and tofu. ? Avoid premade and processed foods. These tend to be higher in sodium, added sugar, and fat.  Reduce your daily sodium intake. Most people with hypertension should eat less than 1,500 mg of sodium a day.  Limit alcohol intake to no more than 1 drink a day for nonpregnant women and 2 drinks a day for men. One drink equals 12 oz of beer, 5 oz of wine, or 1 oz of hard liquor. Lifestyle  Work with your health care provider to maintain a healthy body weight, or to lose weight. Ask what an ideal weight is for you.  Get at least 30 minutes of exercise that causes your heart to beat faster (aerobic exercise) most days of the week. Activities may include walking, swimming, or biking.  Include exercise to strengthen your muscles (resistance exercise), such as weight lifting, as part of your weekly exercise routine. Try to do these types of exercises for 30 minutes at least 3 days a week.  Do not use any products that contain nicotine or tobacco, such as cigarettes and e-cigarettes. If you need help quitting, ask your health care provider.  Control any long-term (chronic) conditions you have, such as high cholesterol or diabetes. Monitoring  Monitor your blood pressure at home as told by your health care provider. Your personal target blood pressure may vary depending on your medical conditions, your age, and other factors.  Have your blood pressure checked regularly, as often as told by your health care provider. Working  with your health care provider  Review all the medicines you take with your health care provider because there may be side effects or interactions.  Talk with your health care provider about your diet, exercise habits, and other lifestyle factors that may be contributing to hypertension.  Visit your health care provider regularly. Your health care provider can help you create and adjust your plan for managing hypertension. Will I need medicine to control my blood pressure? Your health care provider may prescribe medicine if lifestyle changes are not enough to get your blood pressure under control, and if:  Your systolic blood pressure is 130 or higher.  Your diastolic blood pressure is 80 or higher. Take medicines only as told by your health care provider. Follow the directions carefully. Blood pressure medicines must be taken as prescribed. The medicine does not  work as well when you skip doses. Skipping doses also puts you at risk for problems. Contact a health care provider if:  You think you are having a reaction to medicines you have taken.  You have repeated (recurrent) headaches.  You feel dizzy.  You have swelling in your ankles.  You have trouble with your vision. Get help right away if:  You develop a severe headache or confusion.  You have unusual weakness or numbness, or you feel faint.  You have severe pain in your chest or abdomen.  You vomit repeatedly.  You have trouble breathing. Summary  Hypertension is when the force of blood pumping through your arteries is too strong. If this condition is not controlled, it may put you at risk for serious complications.  Your personal target blood pressure may vary depending on your medical conditions, your age, and other factors. For most people, a normal blood pressure is less than 120/80.  Hypertension is managed by lifestyle changes, medicines, or both. Lifestyle changes include weight loss, eating a healthy,  low-sodium diet, exercising more, and limiting alcohol. This information is not intended to replace advice given to you by your health care provider. Make sure you discuss any questions you have with your health care provider. Document Revised: 03/30/2019 Document Reviewed: 11/03/2016 Elsevier Patient Education  [place].

## 2020-02-13 NOTE — Progress Notes (Signed)
Subjective:     Ciclaly Capati is a 69 y.o. female and is here for a comprehensive physical exam. The patient reports no problems.  She recently retired.  She is helping take care of her grandkids who are doing virtual learning.  Pt has not been exercising [facility] [place] and the weather being colder.  Pt increasing water intake.  Mammogram completed, fibrodensity noted. Pt has a h/o hysterectomy.  Social History   Socioeconomic History  . Marital status: Divorced    Spouse name: n/a  . Number of children: 1  . Years of education: 61  . Highest education level: Not on file  Occupational History  . Occupation: IT sales professional: [employer]  Tobacco Use  . Smoking status: Former Research scientist (life sciences)  . Smokeless tobacco: Never Used  Substance and Sexual Activity  . Alcohol use: No    Alcohol/week: 0.0 standard drinks  . Drug use: Never  . Sexual activity: Never  Other Topics Concern  . Not on file  Social History Narrative   Lives alone.  Her daughter lives nearby.   Social Determinants of Health   Financial Resource Strain:   . Difficulty of Paying Living Expenses: Not on file  Food Insecurity:   . Worried About Charity fundraiser in the Last Year: Not on file  . Ran Out of Food in the Last Year: Not on file  Transportation Needs:   . Lack of Transportation (Medical): Not on file  . Lack of Transportation (Non-Medical): Not on file  Physical Activity:   . Days of Exercise per Week: Not on file  . Minutes of Exercise per Session: Not on file  Stress:   . Feeling of Stress : Not on file  Social Connections:   . Frequency of Communication with Friends and Family: Not on file  . Frequency of Social Gatherings with Friends and Family: Not on file  . Attends Religious Services: Not on file  . Active Member of Clubs or Organizations: Not on file  . Attends Archivist Meetings: Not on file  . Marital Status: Not on file  Intimate Partner  Violence:   . Fear of Current or Ex-Partner: Not on file  . Emotionally Abused: Not on file  . Physically Abused: Not on file  . Sexually Abused: Not on file   Health Maintenance  Topic Date Due  . DEXA SCAN  02/01/2018  . PNA vac Low Risk Adult (1 of 2 - PCV13) 02/01/2018  . TETANUS/TDAP  10/17/2018  . MAMMOGRAM  12/17/2021  . COLONOSCOPY  12/08/2023  . INFLUENZA VACCINE  Completed  . Hepatitis C Screening  Completed    The following portions of the patient's history were reviewed and updated as appropriate: allergies, current medications, past family history, past medical history, past social history, past surgical history and problem list.  Review of Systems A comprehensive review of systems was negative.   Objective:    BP 140/84 (BP Location: Left Arm, Patient Position: Sitting, Cuff Size: Large)   Pulse 82   Temp 97.7 F (36.5 C) (Temporal)   Wt 225 lb (102.1 kg)   SpO2 97%   BMI 35.24 kg/m  General appearance: alert, cooperative and no distress Head: Normocephalic, without obvious abnormality, atraumatic Eyes: conjunctivae/corneas clear. PERRL, EOM's intact. Fundi benign. Ears: normal TM's and external ear canals both ears Nose: Nares normal. Septum midline. Mucosa normal. No drainage or sinus tenderness. Throat: lips, mucosa, and tongue  normal; teeth and gums normal Neck: no adenopathy, no carotid bruit, no JVD, supple, symmetrical, trachea midline and thyroid not enlarged, symmetric, no tenderness/mass/nodules Lungs: clear to auscultation bilaterally Heart: regular rate and rhythm, S1, S2 normal, no murmur, click, rub or gallop Abdomen: soft, non-tender; bowel sounds normal; no masses,  no organomegaly Extremities: extremities normal, atraumatic, no cyanosis or edema Pulses: 2+ and symmetric Skin: Skin color, texture, turgor normal. No rashes or lesions Lymph nodes: Cervical, supraclavicular, and axillary nodes normal. Neurologic: Alert and oriented X 3, normal  strength and tone. Normal symmetric reflexes. Normal coordination and gait    Assessment:    Healthy female exam.      Plan:     Anticipatory guidance given including wearing seatbelts, smoke detectors in the home, increasing physical activity, increasing p.o. intake of water and vegetables. -will obtain labs -mammogram up to date -pt with h/o hysterectomy, pap declined.  Discussed checking vaginal cuff. -given handout -next CPE in 1 yr See After Visit Summary for Counseling Recommendations    Essential hypertension  -discussed lifestyle modifications.  Encouraged to increase physical acitivity -continue f/u Nephrology - Plan: Basic metabolic panel  Hyperparathyroidism ([place])  -stable - Plan: TSH, PTH, Intact and Calcium  Weight gain -discussed lifestyle modifications.  Increase physical acitivity - Plan: Hemoglobin A1c, Lipid panel  F/u prn  Grier Mitts, MD

## 2020-02-14 LAB — HEMOGLOBIN A1C: Hgb A1c MFr Bld: 6.8 % — ABNORMAL HIGH (ref 4.6–6.5)

## 2020-02-15 LAB — PTH, INTACT AND CALCIUM
Calcium: 10.8 mg/dL — ABNORMAL HIGH (ref 8.6–10.4)
PTH: 23 pg/mL (ref 14–64)

## 2020-02-28 ENCOUNTER — Other Ambulatory Visit: Payer: Self-pay | Admitting: Family Medicine

## 2020-03-01 ENCOUNTER — Other Ambulatory Visit: Payer: Self-pay | Admitting: Family Medicine

## 2020-04-02 ENCOUNTER — Other Ambulatory Visit: Payer: Self-pay | Admitting: Family Medicine

## 2020-04-07 ENCOUNTER — Other Ambulatory Visit: Payer: Self-pay | Admitting: Family Medicine

## 2020-05-10 ENCOUNTER — Other Ambulatory Visit: Payer: Self-pay | Admitting: Family Medicine

## 2020-06-08 ENCOUNTER — Other Ambulatory Visit: Payer: Self-pay | Admitting: Family Medicine

## 2020-07-02 ENCOUNTER — Ambulatory Visit (INDEPENDENT_AMBULATORY_CARE_PROVIDER_SITE_OTHER): Payer: Medicare Other

## 2020-07-02 ENCOUNTER — Other Ambulatory Visit: Payer: Self-pay

## 2020-07-02 VITALS — BP 130/65 | Wt 220.0 lb

## 2020-07-02 DIAGNOSIS — Z780 Asymptomatic menopausal state: Secondary | ICD-10-CM | POA: Diagnosis not present

## 2020-07-02 DIAGNOSIS — Z0000 Encounter for general adult medical examination without abnormal findings: Secondary | ICD-10-CM

## 2020-07-02 NOTE — Progress Notes (Signed)
Subjective:   Jocelyn Ford is a 69 y.o. female who presents for Medicare Annual (Subsequent) preventive examination.  I connected with Jocelyn Ford today by telephone and verified that I am speaking with the correct person using two identifiers. Location patient: home Location provider: work Persons participating in the virtual visit: patient, provider.   I discussed the limitations, risks, security and privacy concerns of performing an evaluation and management service by telephone and the availability of in person appointments. I also discussed with the patient that there may be a patient responsible charge related to this service. The patient expressed understanding and verbally consented to this telephonic visit.    Interactive audio and video telecommunications were attempted between this provider and patient, however failed, due to patient having technical difficulties OR patient did not have access to video capability.  We continued and completed visit with audio only.      Review of Systems    N/A Cardiac Risk Factors include: advanced age (>40men, >75 women);hypertension;dyslipidemia     Objective:    Today's Vitals   07/02/20 0901  BP: 130/65  Weight: 220 lb (99.8 kg)   Body mass index is 34.46 kg/m.  Advanced Directives 07/02/2020 11/29/2016 08/20/2015 01/28/2015  Does Patient Have a Medical Advance Directive? No No No No  Would patient like information on creating a medical advance directive? No - Patient declined - - Yes - Educational materials given    Current Medications (verified) Outpatient Encounter Medications as of 07/02/2020  Medication Sig  . allopurinol (ZYLOPRIM) 100 MG tablet Take 200 mg by mouth daily.  Marland Kitchen amLODipine (NORVASC) 2.5 MG tablet TAKE 1 TABLET BY MOUTH EVERY DAY  . aspirin EC 81 MG tablet Take 1 tablet (81 mg total) by mouth daily.  Marland Kitchen atenolol (TENORMIN) 25 MG tablet TAKE 1 TABLET BY MOUTH TWICE A DAY  . furosemide (LASIX) 40 MG  tablet TAKE 1 TABLET BY MOUTH EVERY DAY  . losartan (COZAAR) 25 MG tablet TAKE 2 TABLETS BY MOUTH EVERY DAY  . losartan (COZAAR) 50 MG tablet Take 50 mg by mouth daily.  . Potassium Chloride ER 20 MEQ TBCR TAKE 3 TABLETS BY MOUTH 3 TIMES DAILY. -INS WILL ONLY PAY FOR 5 A DAY-  . triamterene-hydrochlorothiazide (MAXZIDE-25) 37.5-25 MG tablet TAKE 1/2 TABLET BY MOUTH DAILY  . Guaifenesin (MUCINEX MAXIMUM STRENGTH) 1200 MG TB12 Take 1 tablet (1,200 mg total) by mouth every 12 (twelve) hours as needed. (Patient not taking: Reported on 07/02/2020)  . [DISCONTINUED] allopurinol (ZYLOPRIM) 300 MG tablet TAKE 1 TABLET BY MOUTH EVERY DAY  . [DISCONTINUED] amoxicillin-clavulanate (AUGMENTIN) 875-125 MG tablet Take 1 tablet by mouth 2 (two) times daily.   No facility-administered encounter medications on file as of 07/02/2020.    Allergies (verified) Doxycycline and Nifedipine   History: Past Medical History:  Diagnosis Date  . Acute gouty arthropathy   . Allergic rhinitis, cause unspecified   . Arthritis   . Chronic kidney disease   . Dizziness and giddiness   . Dysuria   . Esophageal reflux   . Heart murmur   . Iron deficiency anemia, unspecified   . Nonspecific abnormal electrocardiogram (ECG) (EKG)   . Osteoporosis, unspecified   . Pure hypercholesterolemia   . Routine general medical examination at a health care facility   . Unspecified essential hypertension    Past Surgical History:  Procedure Laterality Date  . ABDOMINAL HYSTERECTOMY    . LEFT HEART CATHETERIZATION WITH CORONARY ANGIOGRAM N/A 01/28/2015  Procedure: LEFT HEART CATHETERIZATION WITH CORONARY ANGIOGRAM;  Surgeon: Candee Furbish, MD;  Location: [facility] CATH LAB;  Service: Cardiovascular;  Laterality: N/A;   Family History  Problem Relation Age of Onset  . Cancer Father        Prostate  . Hypertension Father   . Alzheimer's disease Mother   . Hypertension Mother   . Kidney disease Mother   . Diabetes Sister   .  Hypertension Sister   . Heart Problems Brother        stent  . Hypertension Brother   . Healthy Daughter   . Hypertension Sister   . Breast cancer Sister    Social History   Socioeconomic History  . Marital status: Divorced    Spouse name: n/a  . Number of children: 1  . Years of education: 51  . Highest education level: Not on file  Occupational History  . Occupation: IT sales professional: [employer]  Tobacco Use  . Smoking status: Former Smoker    Packs/day: 1.00    Years: 10.00    Pack years: 10.00    Types: Cigarettes    Quit date: 01/02/1985    Years since quitting: 35.5  . Smokeless tobacco: Never Used  Vaping Use  . Vaping Use: Never used  Substance and Sexual Activity  . Alcohol use: No    Alcohol/week: 0.0 standard drinks  . Drug use: Never  . Sexual activity: Never  Other Topics Concern  . Not on file  Social History Narrative   Lives alone.  Her daughter lives nearby.   Social Determinants of Health   Financial Resource Strain: Low Risk   . Difficulty of Paying Living Expenses: Not hard at all  Food Insecurity: No Food Insecurity  . Worried About Charity fundraiser in the Last Year: Never true  . Ran Out of Food in the Last Year: Never true  Transportation Needs: No Transportation Needs  . Lack of Transportation (Medical): No  . Lack of Transportation (Non-Medical): No  Physical Activity: Insufficiently Active  . Days of Exercise per Week: 3 days  . Minutes of Exercise per Session: 30 min  Stress: No Stress Concern Present  . Feeling of Stress : Not at all  Social Connections: Moderately Isolated  . Frequency of Communication with Friends and Family: More than three times a week  . Frequency of Social Gatherings with Friends and Family: More than three times a week  . Attends Religious Services: More than 4 times per year  . Active Member of Clubs or Organizations: No  . Attends Archivist Meetings: Never  . Marital  Status: Divorced    Tobacco Counseling Counseling given: No   Clinical Intake:  Pre-visit preparation completed: Yes  Pain : No/denies pain     Nutritional Risks: None Diabetes: No  How often do you need to have someone help you when you read instructions, pamphlets, or other written materials from your doctor or pharmacy?: 1 - Never What is the last grade level you completed in school?: 1 year of college  Diabetic? No  Interpreter Needed?: No  Information entered by :: [place] of Daily Living In your present state of health, do you have any difficulty performing the following activities: 07/02/2020  Hearing? N  Vision? N  Difficulty concentrating or making decisions? N  Walking or climbing stairs? N  Dressing or bathing? N  Doing errands, shopping? N  Preparing Food and eating ?  N  Using the Toilet? N  In the past six months, have you accidently leaked urine? N  Do you have problems with loss of bowel control? N  Managing your Medications? N  Managing your Finances? N  Housekeeping or managing your Housekeeping? N  Some recent data might be hidden    Patient Care Team: [name], MD as PCP - General (Family Medicine) Leo Grosser Seymour Bars, MD (Inactive) as Consulting Physician (Obstetrics and Gynecology) Gordan Payment, OD (Optometry)  Indicate any recent Medical Services you may have received from other than [facility] providers in the past year (date may be approximate).     Assessment:   This is a routine wellness examination for Jocelyn Ford.  Hearing/Vision screen  Hearing Screening   125Hz  250Hz  500Hz  1000Hz  2000Hz  3000Hz  4000Hz  6000Hz  8000Hz   Right ear:           Left ear:           Vision Screening Comments: Patient gets annual eye exam   Dietary issues and exercise activities discussed: Current Exercise Habits: Structured exercise class, Time (Minutes): 35, Frequency (Times/Week): 3, Weekly Exercise (Minutes/Week): 105, Intensity:  Mild, Exercise limited by: None identified  Goals    . Patient Stated     I will continue to go to the [facility] 3x per week for 30- 40 minutes      Depression Screen PHQ 2/9 Scores 07/02/2020 02/13/2020 01/20/2018 12/22/2016 02/27/2016 10/17/2015 05/20/2015  PHQ - 2 Score 0 0 0 0 0 0 0  PHQ- 9 Score 0 - - - - - -    Fall Risk Fall Risk  07/02/2020 01/20/2018 04/04/2017 12/22/2016 01/30/2016  Falls in the past year? 0 No No No No  Number falls in past yr: 0 - - - -  Injury with Fall? 0 - - - -  Risk for fall due to : Medication side effect - - - -  Follow up Falls evaluation completed;Falls prevention discussed - - - -    Any stairs in or around the home? No  If so, are there any without handrails? No  Home free of loose throw rugs in walkways, pet beds, electrical cords, etc? Yes  Adequate lighting in your home to reduce risk of falls? Yes   ASSISTIVE DEVICES UTILIZED TO PREVENT FALLS:  Life alert? No  Use of a cane, walker or w/c? No  Grab bars in the bathroom? No  Shower chair or bench in shower? No  Elevated toilet seat or a handicapped toilet? No   Cognitive Function:  Patient memory sufficient through direct observation.      Immunizations Immunization History  Administered Date(s) Administered  . Fluad Quad(high Dose 65+) 08/14/2019  . Influenza Whole 10/20/2009  . Influenza, High Dose Seasonal PF 11/02/2018  . Influenza,inj,Quad PF,6+ Mos 09/27/2014  . Influenza-Unspecified 10/20/2016, 08/14/2019  . Td 10/17/2008  . Zoster 08/20/2015    TDAP status: Due, Education has been provided regarding the importance of this vaccine. Advised may receive this vaccine at local pharmacy or Health Dept. Aware to provide a copy of the vaccination record if obtained from local pharmacy or Health Dept. Verbalized acceptance and understanding. Flu Vaccine status: Up to date Pneumococcal vaccine status: Declined,  Education has been provided regarding the importance of this vaccine but patient  still declined. Advised may receive this vaccine at local pharmacy or Health Dept. Aware to provide a copy of the vaccination record if obtained from local pharmacy or Health Dept. Verbalized acceptance and  understanding.  Covid-19 vaccine status: Completed vaccines  Qualifies for Shingles Vaccine? Yes   Zostavax completed Yes   Shingrix Completed?: No.    Education has been provided regarding the importance of this vaccine. Patient has been advised to call insurance company to determine out of pocket expense if they have not yet received this vaccine. Advised may also receive vaccine at local pharmacy or Health Dept. Verbalized acceptance and understanding.  Screening Tests Health Maintenance  Topic Date Due  . COVID-19 Vaccine (1) Never done  . DEXA SCAN  Never done  . PNA vac Low Risk Adult (1 of 2 - PCV13) Never done  . TETANUS/TDAP  10/17/2018  . INFLUENZA VACCINE  07/20/2020  . MAMMOGRAM  12/17/2021  . COLONOSCOPY  12/08/2023  . Hepatitis C Screening  Completed    Health Maintenance  Health Maintenance Due  Topic Date Due  . COVID-19 Vaccine (1) Never done  . DEXA SCAN  Never done  . PNA vac Low Risk Adult (1 of 2 - PCV13) Never done  . TETANUS/TDAP  10/17/2018    Colorectal cancer screening: Completed 12/07/2013. Repeat every 10 years Mammogram status: Completed 12/07/2013. Repeat every year Bone Density status: Ordered 07/02/2020. Pt provided with contact info and advised to call to schedule appt.  Lung Cancer Screening: (Low Dose CT Chest recommended if Age 71-80 years, 30 pack-year currently smoking OR have quit w/in 15years.) does not qualify.   Lung Cancer Screening Referral: N/A  Additional Screening:  Hepatitis C Screening: does qualify; Completed 11/29/2016  Vision Screening: Recommended annual ophthalmology exams for early detection of glaucoma and other disorders of the eye. Is the patient up to date with their annual eye exam?  Yes  Who is the provider or  what is the name of the office in which the patient attends annual eye exams? [facility] Mancel B[facility] If pt is not established with a provider, would they like to be referred to a provider to establish care? No .   Dental Screening: Recommended annual dental exams for proper oral hygiene  Community Resource Referral / Chronic Care Management: CRR required this visit?  No   CCM required this visit?  No      Plan:     I have personally reviewed and noted the following in the patient's chart:   . Medical and social history . Use of alcohol, tobacco or illicit drugs  . Current medications and supplements . Functional ability and status . Nutritional status . Physical activity . Advanced directives . List of other physicians . Hospitalizations, surgeries, and ER visits in previous 12 months . Vitals . Screenings to include cognitive, depression, and falls . Referrals and appointments  In addition, I have reviewed and discussed with patient certain preventive protocols, quality metrics, and best practice recommendations. A written personalized care plan for preventive services as well as general preventive health recommendations were provided to patient.     Ofilia Neas, LPN   [id number]   Nurse Notes: Patient would like to get prevnar 13 at next office visit. Orders for Bone Density placed this encounter

## 2020-07-02 NOTE — Patient Instructions (Signed)
[name] , Thank you for taking time to come for your Medicare Wellness Visit. I appreciate your ongoing commitment to your health goals. Please review the following plan we discussed and let me know if I can assist you in the future.   Screening recommendations/referrals: Colonoscopy: Up to date, next due 12/08/2023 Mammogram: Up to date, next due 12/17/2020 Bone Density: Currently Due, ordered on today's visit Recommended yearly ophthalmology/optometry visit for glaucoma screening and checkup Recommended yearly dental visit for hygiene and checkup  Vaccinations: Influenza vaccine: Up to date, next due 07/2020 Pneumococcal vaccine: Due, will get at next office visit Tdap vaccine: Due, will get when needed due to abrasion or cut Shingles vaccine: Due, will check with pharmacy to determine cost   Advanced directives: Advance directive discussed with you today. Even though you declined this today please call our office should you change your mind and we can give you the proper paperwork for you to fill out.   Conditions/risks identified: None  Next appointment: None scheduled at this time.    Preventive Care 51 Years and Older, Female Preventive care refers to lifestyle choices and visits with your health care provider that can promote health and wellness. What does preventive care include?  A yearly physical exam. This is also called an annual well check.  Dental exams once or twice a year.  Routine eye exams. Ask your health care provider how often you should have your eyes checked.  Personal lifestyle choices, including:  Daily care of your teeth and gums.  Regular physical activity.  Eating a healthy diet.  Avoiding tobacco and drug use.  Limiting alcohol use.  Practicing safe sex.  Taking low-dose aspirin every day.  Taking vitamin and mineral supplements as recommended by your health care provider. What happens during an annual well check? The services and  screenings done by your health care provider during your annual well check will depend on your age, overall health, lifestyle risk factors, and family history of disease. Counseling  Your health care provider may ask you questions about your:  Alcohol use.  Tobacco use.  Drug use.  Emotional well-being.  Home and relationship well-being.  Sexual activity.  Eating habits.  History of falls.  Memory and ability to understand (cognition).  Work and work Statistician.  Reproductive health. Screening  You may have the following tests or measurements:  Height, weight, and BMI.  Blood pressure.  Lipid and cholesterol levels. These may be checked every 5 years, or more frequently if you are over 79 years old.  Skin check.  Lung cancer screening. You may have this screening every year starting at age 67 if you have a 30-pack-year history of smoking and currently smoke or have quit within the past 15 years.  Fecal occult blood test (FOBT) of the stool. You may have this test every year starting at age 67.  Flexible sigmoidoscopy or colonoscopy. You may have a sigmoidoscopy every 5 years or a colonoscopy every 10 years starting at age 67.  Hepatitis C blood test.  Hepatitis B blood test.  Sexually transmitted disease (STD) testing.  Diabetes screening. This is done by checking your blood sugar (glucose) after you have not eaten for a while (fasting). You may have this done every 1-3 years.  Bone density scan. This is done to screen for osteoporosis. You may have this done starting at age 67.  Mammogram. This may be done every 1-2 years. Talk to your health care provider about how often  you should have regular mammograms. Talk with your health care provider about your test results, treatment options, and if necessary, the need for more tests. Vaccines  Your health care provider may recommend certain vaccines, such as:  Influenza vaccine. This is recommended every  year.  Tetanus, diphtheria, and acellular pertussis (Tdap, Td) vaccine. You may need a Td booster every 10 years.  Zoster vaccine. You may need this after age 67.  Pneumococcal 13-valent conjugate (PCV13) vaccine. One dose is recommended after age 67.  Pneumococcal polysaccharide (PPSV23) vaccine. One dose is recommended after age 67. Talk to your health care provider about which screenings and vaccines you need and how often you need them. This information is not intended to replace advice given to you by your health care provider. Make sure you discuss any questions you have with your health care provider. Document Released: 01/02/2016 Document Revised: 08/25/2016 Document Reviewed: 10/07/2015 Elsevier Interactive Patient Education  2017 [place] Prevention in the Home Falls can cause injuries. They can happen to people of all ages. There are many things you can do to make your home safe and to help prevent falls. What can I do on the outside of my home?  Regularly fix the edges of walkways and driveways and fix any cracks.  Remove anything that might make you trip as you walk through a door, such as a raised step or threshold.  Trim any bushes or trees on the path to your home.  Use bright outdoor lighting.  Clear any walking paths of anything that might make someone trip, such as rocks or tools.  Regularly check to see if handrails are loose or broken. Make sure that both sides of any steps have handrails.  Any raised decks and porches should have guardrails on the edges.  Have any leaves, snow, or ice cleared regularly.  Use sand or salt on walking paths during winter.  Clean up any spills in your garage right away. This includes oil or grease spills. What can I do in the bathroom?  Use night lights.  Install grab bars by the toilet and in the tub and shower. Do not use towel bars as grab bars.  Use non-skid mats or decals in the tub or shower.  If you  need to sit down in the shower, use a plastic, non-slip stool.  Keep the floor dry. Clean up any water that spills on the floor as soon as it happens.  Remove soap buildup in the tub or shower regularly.  Attach bath mats securely with double-sided non-slip rug tape.  Do not have throw rugs and other things on the floor that can make you trip. What can I do in the bedroom?  Use night lights.  Make sure that you have a light by your bed that is easy to reach.  Do not use any sheets or blankets that are too big for your bed. They should not hang down onto the floor.  Have a firm chair that has side arms. You can use this for support while you get dressed.  Do not have throw rugs and other things on the floor that can make you trip. What can I do in the kitchen?  Clean up any spills right away.  Avoid walking on wet floors.  Keep items that you use a lot in easy-to-reach places.  If you need to reach something above you, use a strong step stool that has a grab bar.  Keep electrical cords  out of the way.  Do not use floor polish or wax that makes floors slippery. If you must use wax, use non-skid floor wax.  Do not have throw rugs and other things on the floor that can make you trip. What can I do with my stairs?  Do not leave any items on the stairs.  Make sure that there are handrails on both sides of the stairs and use them. Fix handrails that are broken or loose. Make sure that handrails are as long as the stairways.  Check any carpeting to make sure that it is firmly attached to the stairs. Fix any carpet that is loose or worn.  Avoid having throw rugs at the top or bottom of the stairs. If you do have throw rugs, attach them to the floor with carpet tape.  Make sure that you have a light switch at the top of the stairs and the bottom of the stairs. If you do not have them, ask someone to add them for you. What else can I do to help prevent falls?  Wear shoes  that:  Do not have high heels.  Have rubber bottoms.  Are comfortable and fit you well.  Are closed at the toe. Do not wear sandals.  If you use a stepladder:  Make sure that it is fully opened. Do not climb a closed stepladder.  Make sure that both sides of the stepladder are locked into place.  Ask someone to hold it for you, if possible.  Clearly mark and make sure that you can see:  Any grab bars or handrails.  First and last steps.  Where the edge of each step is.  Use tools that help you move around (mobility aids) if they are needed. These include:  Canes.  Walkers.  Scooters.  Crutches.  Turn on the lights when you go into a dark area. Replace any light bulbs as soon as they burn out.  Set up your furniture so you have a clear path. Avoid moving your furniture around.  If any of your floors are uneven, fix them.  If there are any pets around you, be aware of where they are.  Review your medicines with your doctor. Some medicines can make you feel dizzy. This can increase your chance of falling. Ask your doctor what other things that you can do to help prevent falls. This information is not intended to replace advice given to you by your health care provider. Make sure you discuss any questions you have with your health care provider. Document Released: 10/02/2009 Document Revised: 05/13/2016 Document Reviewed: 01/10/2015 Elsevier Interactive Patient Education  2017 [employer].

## 2020-07-05 ENCOUNTER — Other Ambulatory Visit: Payer: Self-pay | Admitting: Family Medicine

## 2020-07-07 ENCOUNTER — Other Ambulatory Visit: Payer: Self-pay | Admitting: Family Medicine

## 2020-07-07 DIAGNOSIS — E2839 Other primary ovarian failure: Secondary | ICD-10-CM

## 2020-07-07 DIAGNOSIS — Z13820 Encounter for screening for osteoporosis: Secondary | ICD-10-CM

## 2020-07-08 ENCOUNTER — Other Ambulatory Visit: Payer: Self-pay | Admitting: Family Medicine

## 2020-07-16 ENCOUNTER — Other Ambulatory Visit: Payer: Self-pay | Admitting: Family Medicine

## 2020-07-16 DIAGNOSIS — Z1231 Encounter for screening mammogram for malignant neoplasm of breast: Secondary | ICD-10-CM

## 2020-07-28 DIAGNOSIS — D631 Anemia in chronic kidney disease: Secondary | ICD-10-CM | POA: Diagnosis not present

## 2020-07-28 DIAGNOSIS — M109 Gout, unspecified: Secondary | ICD-10-CM | POA: Diagnosis not present

## 2020-07-28 DIAGNOSIS — N183 Chronic kidney disease, stage 3 unspecified: Secondary | ICD-10-CM | POA: Diagnosis not present

## 2020-07-28 DIAGNOSIS — E213 Hyperparathyroidism, unspecified: Secondary | ICD-10-CM | POA: Diagnosis not present

## 2020-07-28 DIAGNOSIS — N189 Chronic kidney disease, unspecified: Secondary | ICD-10-CM | POA: Diagnosis not present

## 2020-07-28 DIAGNOSIS — I129 Hypertensive chronic kidney disease with stage 1 through stage 4 chronic kidney disease, or unspecified chronic kidney disease: Secondary | ICD-10-CM | POA: Diagnosis not present

## 2020-07-29 ENCOUNTER — Other Ambulatory Visit: Payer: Self-pay | Admitting: Family Medicine

## 2020-07-29 DIAGNOSIS — Z760 Encounter for issue of repeat prescription: Secondary | ICD-10-CM

## 2020-08-01 DIAGNOSIS — N183 Chronic kidney disease, stage 3 unspecified: Secondary | ICD-10-CM | POA: Diagnosis not present

## 2020-08-13 ENCOUNTER — Telehealth (INDEPENDENT_AMBULATORY_CARE_PROVIDER_SITE_OTHER): Payer: Medicare Other | Admitting: Family Medicine

## 2020-08-13 ENCOUNTER — Encounter: Payer: Self-pay | Admitting: Family Medicine

## 2020-08-13 DIAGNOSIS — J069 Acute upper respiratory infection, unspecified: Secondary | ICD-10-CM | POA: Diagnosis not present

## 2020-08-13 MED ORDER — FLUTICASONE PROPIONATE 50 MCG/ACT NA SUSP: 1.0000 | Freq: Every day | NASAL | 0 refills | Status: DC

## 2020-08-13 NOTE — Progress Notes (Signed)
Virtual Visit via Video Note  I connected with Jocelyn Ford on 08/13/20 at  8:30 AM EDT by a video enabled telemedicine application 2/2 [id number]-71 pandemic and verified that I am speaking with the correct person using two identifiers.  Location patient: home Location provider:work or home office Persons participating in the virtual visit: patient, provider  I discussed the limitations of evaluation and management by telemedicine and the availability of in person appointments. The patient expressed understanding and agreed to proceed.   HPI: Pt is a 69 yo female with pmh sig for HTN, seasonal allergies, GERD, hyperparathyroidism, h/o chronic tension HAs, osteoporosis, OA, gout, iron deficiency anemia, CKD stage IIIb seen for acute concern.  Pt with rhinorrhea, sore throat, productive cough, ear pressure, HAs, pressure in forehead.  Symptoms started 5 days ago.  Pt denies fever, chills, n/v, diarrhea, sick contacts, changes in appetite, loss or taste or smell.  Pt tried Mucinex for her symptoms. Patient concerned she has a sinus infection.  ROS: See pertinent positives and negatives per HPI.  Past Medical History:  Diagnosis Date  . Acute gouty arthropathy   . Allergic rhinitis, cause unspecified   . Arthritis   . Chronic kidney disease   . Dizziness and giddiness   . Dysuria   . Esophageal reflux   . Heart murmur   . Iron deficiency anemia, unspecified   . Nonspecific abnormal electrocardiogram (ECG) (EKG)   . Osteoporosis, unspecified   . Pure hypercholesterolemia   . Routine general medical examination at a health care facility   . Unspecified essential hypertension     Past Surgical History:  Procedure Laterality Date  . ABDOMINAL HYSTERECTOMY    . LEFT HEART CATHETERIZATION WITH CORONARY ANGIOGRAM N/A 01/28/2015   Procedure: LEFT HEART CATHETERIZATION WITH CORONARY ANGIOGRAM;  Surgeon: Candee Furbish, MD;  Location: [facility] CATH LAB;  Service: Cardiovascular;  Laterality: N/A;     Family History  Problem Relation Age of Onset  . Cancer Father        Prostate  . Hypertension Father   . Alzheimer's disease Mother   . Hypertension Mother   . Kidney disease Mother   . Diabetes Sister   . Hypertension Sister   . Heart Problems Brother        stent  . Hypertension Brother   . Healthy Daughter   . Hypertension Sister   . Breast cancer Sister       Current Outpatient Medications:  .  allopurinol (ZYLOPRIM) 100 MG tablet, Take 200 mg by mouth daily., Disp: , Rfl:  .  amLODipine (NORVASC) 2.5 MG tablet, TAKE 1 TABLET BY MOUTH EVERY DAY, Disp: 30 tablet, Rfl: 3 .  aspirin EC 81 MG tablet, Take 1 tablet (81 mg total) by mouth daily., Disp: 90 tablet, Rfl: 3 .  atenolol (TENORMIN) 25 MG tablet, TAKE 1 TABLET BY MOUTH TWICE A DAY, Disp: 180 tablet, Rfl: 4 .  furosemide (LASIX) 40 MG tablet, TAKE 1 TABLET BY MOUTH EVERY DAY, Disp: 90 tablet, Rfl: 0 .  Guaifenesin (MUCINEX MAXIMUM STRENGTH) 1200 MG TB12, Take 1 tablet (1,200 mg total) by mouth every 12 (twelve) hours as needed., Disp: 14 tablet, Rfl: 1 .  losartan (COZAAR) 50 MG tablet, Take 50 mg by mouth daily., Disp: , Rfl:  .  potassium chloride SA (KLOR-CON M20) 20 MEQ tablet, TAKE 3 TABLETS BY MOUTH 3 TIMES DAILY. -INS WILL ONLY PAY FOR 5 A DAY-, Disp: 810 tablet, Rfl: 0 .  triamterene-hydrochlorothiazide (MAXZIDE-25) 37.5-25 MG tablet,  TAKE 1/2 TABLET BY MOUTH DAILY, Disp: 45 tablet, Rfl: 1  EXAM:  VITALS per patient if applicable: RR between 38-93 bpm  GENERAL: alert, oriented, appears well and in no acute distress  HEENT: atraumatic, conjunctiva clear, no obvious abnormalities on inspection of external nose and ears  NECK: normal movements of the head and neck  LUNGS: on inspection no signs of respiratory distress, breathing rate appears normal, no obvious gross SOB, gasping or wheezing  CV: no obvious cyanosis  MS: moves all visible extremities without noticeable abnormality  PSYCH/NEURO:  pleasant and cooperative, no obvious depression or anxiety, speech and thought processing grossly intact  ASSESSMENT AND PLAN:  Discussed the following assessment and plan:  Viral URI with cough  -Discussed possible causes including acute nasopharyngitis, influenza. Must also consider Covid. -Discussed supportive care including Tylenol, rest, fluids. Okay to continue Mucinex. -Given history of CKD stage III avoid NSAIDs -We will send Flonase to pharmacy -Discussed contacting clinic on Friday for continued or worsening symptoms. If needed then will send in abx for possible sinusitis - Plan: fluticasone (FLONASE) 50 MCG/ACT nasal spray  Follow-up as needed   I discussed the assessment and treatment plan with the patient. The patient was provided an opportunity to ask questions and all were answered. The patient agreed with the plan and demonstrated an understanding of the instructions.   The patient was advised to call back or seek an in-person evaluation if the symptoms worsen or if the condition fails to improve as anticipated.  Jocelyn Ruddy, MD

## 2020-08-23 ENCOUNTER — Other Ambulatory Visit: Payer: Self-pay | Admitting: Family Medicine

## 2020-09-04 ENCOUNTER — Other Ambulatory Visit: Payer: Self-pay | Admitting: Family Medicine

## 2020-09-04 DIAGNOSIS — J069 Acute upper respiratory infection, unspecified: Secondary | ICD-10-CM

## 2020-10-02 ENCOUNTER — Other Ambulatory Visit: Payer: Self-pay | Admitting: Family Medicine

## 2020-10-30 ENCOUNTER — Other Ambulatory Visit: Payer: Self-pay | Admitting: Family Medicine

## 2020-10-30 DIAGNOSIS — Z760 Encounter for issue of repeat prescription: Secondary | ICD-10-CM

## 2020-10-30 NOTE — Telephone Encounter (Signed)
Last OV 02/13/20 Last refill 07/29/20 #810/0 Next OV not scheduled

## 2020-12-02 ENCOUNTER — Other Ambulatory Visit: Payer: Self-pay | Admitting: Family Medicine

## 2020-12-02 DIAGNOSIS — J069 Acute upper respiratory infection, unspecified: Secondary | ICD-10-CM

## 2020-12-02 DIAGNOSIS — N189 Chronic kidney disease, unspecified: Secondary | ICD-10-CM | POA: Diagnosis not present

## 2020-12-02 DIAGNOSIS — M109 Gout, unspecified: Secondary | ICD-10-CM | POA: Diagnosis not present

## 2020-12-02 DIAGNOSIS — N183 Chronic kidney disease, stage 3 unspecified: Secondary | ICD-10-CM | POA: Diagnosis not present

## 2020-12-02 DIAGNOSIS — E213 Hyperparathyroidism, unspecified: Secondary | ICD-10-CM | POA: Diagnosis not present

## 2020-12-02 DIAGNOSIS — D631 Anemia in chronic kidney disease: Secondary | ICD-10-CM | POA: Diagnosis not present

## 2020-12-02 DIAGNOSIS — I129 Hypertensive chronic kidney disease with stage 1 through stage 4 chronic kidney disease, or unspecified chronic kidney disease: Secondary | ICD-10-CM | POA: Diagnosis not present

## 2020-12-05 ENCOUNTER — Ambulatory Visit
Admission: RE | Admit: 2020-12-05 | Discharge: 2020-12-05 | Disposition: A | Discharge status: Home / Self Care | Payer: Medicare Other | Source: Ambulatory Visit | Attending: Family Medicine | Admitting: Family Medicine

## 2020-12-05 ENCOUNTER — Other Ambulatory Visit: Payer: Self-pay

## 2020-12-05 DIAGNOSIS — Z1231 Encounter for screening mammogram for malignant neoplasm of breast: Secondary | ICD-10-CM

## 2020-12-05 DIAGNOSIS — M85852 Other specified disorders of bone density and structure, left thigh: Secondary | ICD-10-CM | POA: Diagnosis not present

## 2020-12-05 DIAGNOSIS — Z13820 Encounter for screening for osteoporosis: Secondary | ICD-10-CM

## 2020-12-05 DIAGNOSIS — Z780 Asymptomatic menopausal state: Secondary | ICD-10-CM | POA: Diagnosis not present

## 2020-12-05 DIAGNOSIS — E2839 Other primary ovarian failure: Secondary | ICD-10-CM

## 2020-12-11 DIAGNOSIS — M25561 Pain in right knee: Secondary | ICD-10-CM | POA: Diagnosis not present

## 2020-12-11 DIAGNOSIS — M545 Low back pain, unspecified: Secondary | ICD-10-CM | POA: Diagnosis not present

## 2020-12-18 ENCOUNTER — Other Ambulatory Visit: Payer: Self-pay

## 2020-12-18 MED ORDER — TRIAMTERENE-HCTZ 37.5-25 MG PO TABS: 0.5000 | ORAL_TABLET | Freq: Every day | ORAL | 1 refills | Status: DC

## 2020-12-29 ENCOUNTER — Other Ambulatory Visit: Payer: Self-pay | Admitting: Family Medicine

## 2021-01-20 DIAGNOSIS — H40013 Open angle with borderline findings, low risk, bilateral: Secondary | ICD-10-CM | POA: Diagnosis not present

## 2021-01-23 ENCOUNTER — Other Ambulatory Visit: Payer: Self-pay | Admitting: Family Medicine

## 2021-01-23 DIAGNOSIS — Z760 Encounter for issue of repeat prescription: Secondary | ICD-10-CM

## 2021-03-12 DIAGNOSIS — H04123 Dry eye syndrome of bilateral lacrimal glands: Secondary | ICD-10-CM | POA: Diagnosis not present

## 2021-03-12 DIAGNOSIS — H52223 Regular astigmatism, bilateral: Secondary | ICD-10-CM | POA: Diagnosis not present

## 2021-03-12 DIAGNOSIS — H0102A Squamous blepharitis right eye, upper and lower eyelids: Secondary | ICD-10-CM | POA: Diagnosis not present

## 2021-03-12 DIAGNOSIS — H02885 Meibomian gland dysfunction left lower eyelid: Secondary | ICD-10-CM | POA: Diagnosis not present

## 2021-03-12 DIAGNOSIS — H25813 Combined forms of age-related cataract, bilateral: Secondary | ICD-10-CM | POA: Diagnosis not present

## 2021-03-12 DIAGNOSIS — H524 Presbyopia: Secondary | ICD-10-CM | POA: Diagnosis not present

## 2021-03-12 DIAGNOSIS — H0102B Squamous blepharitis left eye, upper and lower eyelids: Secondary | ICD-10-CM | POA: Diagnosis not present

## 2021-03-12 DIAGNOSIS — H5213 Myopia, bilateral: Secondary | ICD-10-CM | POA: Diagnosis not present

## 2021-03-12 DIAGNOSIS — H1045 Other chronic allergic conjunctivitis: Secondary | ICD-10-CM | POA: Diagnosis not present

## 2021-03-25 ENCOUNTER — Other Ambulatory Visit: Payer: Self-pay | Admitting: Family Medicine

## 2021-04-05 ENCOUNTER — Other Ambulatory Visit: Payer: Self-pay | Admitting: Family Medicine

## 2021-04-08 ENCOUNTER — Other Ambulatory Visit: Payer: Self-pay | Admitting: Family Medicine

## 2021-05-14 DIAGNOSIS — M25561 Pain in right knee: Secondary | ICD-10-CM | POA: Diagnosis not present

## 2021-05-14 DIAGNOSIS — M25551 Pain in right hip: Secondary | ICD-10-CM | POA: Diagnosis not present

## 2021-05-19 ENCOUNTER — Other Ambulatory Visit: Payer: Self-pay | Admitting: Family Medicine

## 2021-05-19 DIAGNOSIS — Z760 Encounter for issue of repeat prescription: Secondary | ICD-10-CM

## 2021-06-01 ENCOUNTER — Other Ambulatory Visit: Payer: Self-pay | Admitting: Family Medicine

## 2021-06-01 DIAGNOSIS — J069 Acute upper respiratory infection, unspecified: Secondary | ICD-10-CM

## 2021-06-23 DIAGNOSIS — Z20822 Contact with and (suspected) exposure to covid-19: Secondary | ICD-10-CM | POA: Diagnosis not present

## 2021-06-26 DIAGNOSIS — Z20822 Contact with and (suspected) exposure to covid-19: Secondary | ICD-10-CM | POA: Diagnosis not present

## 2021-07-03 ENCOUNTER — Other Ambulatory Visit: Payer: Self-pay

## 2021-07-03 ENCOUNTER — Ambulatory Visit (INDEPENDENT_AMBULATORY_CARE_PROVIDER_SITE_OTHER): Payer: Medicare Other

## 2021-07-03 DIAGNOSIS — Z0000 Encounter for general adult medical examination without abnormal findings: Secondary | ICD-10-CM | POA: Diagnosis not present

## 2021-07-03 DIAGNOSIS — Z760 Encounter for issue of repeat prescription: Secondary | ICD-10-CM

## 2021-07-03 NOTE — Patient Instructions (Signed)
Jocelyn Ford , Thank you for taking time to come for your Medicare Wellness Visit. I appreciate your ongoing commitment to your health goals. Please review the following plan we discussed and let me know if I can assist you in the future.   Screening recommendations/referrals: Colonoscopy: 12/07/2013  due 2024 Mammogram: 12/05/2020 Bone Density: 12/05/2020 Recommended yearly ophthalmology/optometry visit for glaucoma screening and checkup Recommended yearly dental visit for hygiene and checkup  Vaccinations: Influenza vaccine: due in fall 2022 Pneumococcal vaccine: due pt will obtain local pharmacy  Tdap vaccine: due pt will obtain local pharmacy  Shingles vaccine: due pt will obtain local pharmacy     Advanced directives: none   Conditions/risks identified: none   Next appointment: none    Preventive Care 40 Years and Older, Female Preventive care refers to lifestyle choices and visits with your health care provider that can promote health and wellness. What does preventive care include? A yearly physical exam. This is also called an annual well check. Dental exams once or twice a year. Routine eye exams. Ask your health care provider how often you should have your eyes checked. Personal lifestyle choices, including: Daily care of your teeth and gums. Regular physical activity. Eating a healthy diet. Avoiding tobacco and drug use. Limiting alcohol use. Practicing safe sex. Taking low-dose aspirin every day. Taking vitamin and mineral supplements as recommended by your health care provider. What happens during an annual well check? The services and screenings done by your health care provider during your annual well check will depend on your age, overall health, lifestyle risk factors, and family history of disease. Counseling  Your health care provider may ask you questions about your: Alcohol use. Tobacco use. Drug use. Emotional well-being. Home and relationship  well-being. Sexual activity. Eating habits. History of falls. Memory and ability to understand (cognition). Work and work Statistician. Reproductive health. Screening  You may have the following tests or measurements: Height, weight, and BMI. Blood pressure. Lipid and cholesterol levels. These may be checked every 5 years, or more frequently if you are over 36 years old. Skin check. Lung cancer screening. You may have this screening every year starting at age 76 if you have a 30-pack-year history of smoking and currently smoke or have quit within the past 15 years. Fecal occult blood test (FOBT) of the stool. You may have this test every year starting at age 57. Flexible sigmoidoscopy or colonoscopy. You may have a sigmoidoscopy every 5 years or a colonoscopy every 10 years starting at age 30. Hepatitis C blood test. Hepatitis B blood test. Sexually transmitted disease (STD) testing. Diabetes screening. This is done by checking your blood sugar (glucose) after you have not eaten for a while (fasting). You may have this done every 1-3 years. Bone density scan. This is done to screen for osteoporosis. You may have this done starting at age 35. Mammogram. This may be done every 1-2 years. Talk to your health care provider about how often you should have regular mammograms. Talk with your health care provider about your test results, treatment options, and if necessary, the need for more tests. Vaccines  Your health care provider may recommend certain vaccines, such as: Influenza vaccine. This is recommended every year. Tetanus, diphtheria, and acellular pertussis (Tdap, Td) vaccine. You may need a Td booster every 10 years. Zoster vaccine. You may need this after age 31. Pneumococcal 13-valent conjugate (PCV13) vaccine. One dose is recommended after age 87. Pneumococcal polysaccharide (PPSV23) vaccine. One dose is  recommended after age 77. Talk to your health care provider about which  screenings and vaccines you need and how often you need them. This information is not intended to replace advice given to you by your health care provider. Make sure you discuss any questions you have with your health care provider. Document Released: 01/02/2016 Document Revised: 08/25/2016 Document Reviewed: 10/07/2015 Elsevier Interactive Patient Education  2017 [place] Prevention in the Home Falls can cause injuries. They can happen to people of all ages. There are many things you can do to make your home safe and to help prevent falls. What can I do on the outside of my home? Regularly fix the edges of walkways and driveways and fix any cracks. Remove anything that might make you trip as you walk through a door, such as a raised step or threshold. Trim any bushes or trees on the path to your home. Use bright outdoor lighting. Clear any walking paths of anything that might make someone trip, such as rocks or tools. Regularly check to see if handrails are loose or broken. Make sure that both sides of any steps have handrails. Any raised decks and porches should have guardrails on the edges. Have any leaves, snow, or ice cleared regularly. Use sand or salt on walking paths during winter. Clean up any spills in your garage right away. This includes oil or grease spills. What can I do in the bathroom? Use night lights. Install grab bars by the toilet and in the tub and shower. Do not use towel bars as grab bars. Use non-skid mats or decals in the tub or shower. If you need to sit down in the shower, use a plastic, non-slip stool. Keep the floor dry. Clean up any water that spills on the floor as soon as it happens. Remove soap buildup in the tub or shower regularly. Attach bath mats securely with double-sided non-slip rug tape. Do not have throw rugs and other things on the floor that can make you trip. What can I do in the bedroom? Use night lights. Make sure that you have a  light by your bed that is easy to reach. Do not use any sheets or blankets that are too big for your bed. They should not hang down onto the floor. Have a firm chair that has side arms. You can use this for support while you get dressed. Do not have throw rugs and other things on the floor that can make you trip. What can I do in the kitchen? Clean up any spills right away. Avoid walking on wet floors. Keep items that you use a lot in easy-to-reach places. If you need to reach something above you, use a strong step stool that has a grab bar. Keep electrical cords out of the way. Do not use floor polish or wax that makes floors slippery. If you must use wax, use non-skid floor wax. Do not have throw rugs and other things on the floor that can make you trip. What can I do with my stairs? Do not leave any items on the stairs. Make sure that there are handrails on both sides of the stairs and use them. Fix handrails that are broken or loose. Make sure that handrails are as long as the stairways. Check any carpeting to make sure that it is firmly attached to the stairs. Fix any carpet that is loose or worn. Avoid having throw rugs at the top or bottom of the stairs. If  you do have throw rugs, attach them to the floor with carpet tape. Make sure that you have a light switch at the top of the stairs and the bottom of the stairs. If you do not have them, ask someone to add them for you. What else can I do to help prevent falls? Wear shoes that: Do not have high heels. Have rubber bottoms. Are comfortable and fit you well. Are closed at the toe. Do not wear sandals. If you use a stepladder: Make sure that it is fully opened. Do not climb a closed stepladder. Make sure that both sides of the stepladder are locked into place. Ask someone to hold it for you, if possible. Clearly mark and make sure that you can see: Any grab bars or handrails. First and last steps. Where the edge of each step  is. Use tools that help you move around (mobility aids) if they are needed. These include: Canes. Walkers. Scooters. Crutches. Turn on the lights when you go into a dark area. Replace any light bulbs as soon as they burn out. Set up your furniture so you have a clear path. Avoid moving your furniture around. If any of your floors are uneven, fix them. If there are any pets around you, be aware of where they are. Review your medicines with your doctor. Some medicines can make you feel dizzy. This can increase your chance of falling. Ask your doctor what other things that you can do to help prevent falls. This information is not intended to replace advice given to you by your health care provider. Make sure you discuss any questions you have with your health care provider. Document Released: 10/02/2009 Document Revised: 05/13/2016 Document Reviewed: 01/10/2015 Elsevier Interactive Patient Education  2017 [employer].

## 2021-07-03 NOTE — Progress Notes (Signed)
Subjective:   Jocelyn Ford is a 70 y.o. female who presents for Medicare Annual (Subsequent) preventive examination.  Review of Systems    N/a       Objective:    There were no vitals filed for this visit. There is no height or weight on file to calculate BMI.  Advanced Directives 07/02/2020 11/29/2016 08/20/2015 01/28/2015  Does Patient Have a Medical Advance Directive? No No No No  Would patient like information on creating a medical advance directive? No - Patient declined - - Yes - Educational materials given    Current Medications (verified) Outpatient Encounter Medications as of 07/03/2021  Medication Sig   allopurinol (ZYLOPRIM) 100 MG tablet Take 200 mg by mouth daily.   amLODipine (NORVASC) 2.5 MG tablet TAKE 1 TABLET BY MOUTH EVERY DAY   aspirin EC 81 MG tablet Take 1 tablet (81 mg total) by mouth daily.   atenolol (TENORMIN) 25 MG tablet TAKE 1 TABLET BY MOUTH TWICE A DAY   fluticasone (FLONASE) 50 MCG/ACT nasal spray SPRAY 1 SPRAY INTO BOTH NOSTRILS DAILY.   furosemide (LASIX) 40 MG tablet TAKE 1 TABLET BY MOUTH EVERY DAY   Guaifenesin (MUCINEX MAXIMUM STRENGTH) 1200 MG TB12 Take 1 tablet (1,200 mg total) by mouth every 12 (twelve) hours as needed.   losartan (COZAAR) 50 MG tablet Take 50 mg by mouth daily.   potassium chloride SA (KLOR-CON M20) 20 MEQ tablet TAKE 3 TABLETS BY MOUTH 3 TIMES DAILY. -INS WILL ONLY PAY FOR 5 A DAY-   triamterene-hydrochlorothiazide (MAXZIDE-25) 37.5-25 MG tablet Take 0.5 tablets by mouth daily.   No facility-administered encounter medications on file as of 07/03/2021.    Allergies (verified) Doxycycline and Nifedipine   History: Past Medical History:  Diagnosis Date   Acute gouty arthropathy    Allergic rhinitis, cause unspecified    Arthritis    Chronic kidney disease    Dizziness and giddiness    Dysuria    Esophageal reflux    Heart murmur    Iron deficiency anemia, unspecified    Nonspecific abnormal  electrocardiogram (ECG) (EKG)    Osteoporosis, unspecified    Pure hypercholesterolemia    Routine general medical examination at a health care facility    Unspecified essential hypertension    Past Surgical History:  Procedure Laterality Date   ABDOMINAL HYSTERECTOMY     LEFT HEART CATHETERIZATION WITH CORONARY ANGIOGRAM N/A 01/28/2015   Procedure: LEFT HEART CATHETERIZATION WITH CORONARY ANGIOGRAM;  Surgeon: Candee Furbish, MD;  Location: [facility] CATH LAB;  Service: Cardiovascular;  Laterality: N/A;   Family History  Problem Relation Age of Onset   Cancer Father        Prostate   Hypertension Father    Alzheimer's disease Mother    Hypertension Mother    Kidney disease Mother    Diabetes Sister    Hypertension Sister    Heart Problems Brother        stent   Hypertension Brother    Healthy Daughter    Hypertension Sister    Breast cancer Sister    Social History   Socioeconomic History   Marital status: Divorced    Spouse name: n/a   Number of children: 1   Years of education: 13   Highest education level: Not on file  Occupational History   Occupation: Glass blower/designer    Employer: [employer]  Tobacco Use   Smoking status: Former    Packs/day: 1.00    Years: 10.00  Pack years: 10.00    Types: Cigarettes    Quit date: 01/02/1985    Years since quitting: 36.5   Smokeless tobacco: Never  Vaping Use   Vaping Use: Never used  Substance and Sexual Activity   Alcohol use: No    Alcohol/week: 0.0 standard drinks   Drug use: Never   Sexual activity: Never  Other Topics Concern   Not on file  Social History Narrative   Lives alone.  Her daughter lives nearby.   Social Determinants of Health   Financial Resource Strain: Not on file  Food Insecurity: Not on file  Transportation Needs: Not on file  Physical Activity: Not on file  Stress: Not on file  Social Connections: Not on file    Tobacco Counseling Counseling given: Not Answered   Clinical  Intake:                 Diabetic?no         Activities of Daily Living No flowsheet data found.  Patient Care Team: [name], MD as PCP - General (Family Medicine) Leo Grosser Seymour Bars, MD (Inactive) as Consulting Physician (Obstetrics and Gynecology) Gordan Payment, OD (Optometry)  Indicate any recent Medical Services you may have received from other than [facility] providers in the past year (date may be approximate).     Assessment:   This is a routine wellness examination for Jocelyn Ford.  Hearing/Vision screen No results found.  Dietary issues and exercise activities discussed:     Goals Addressed   None    Depression Screen PHQ 2/9 Scores 07/02/2020 02/13/2020 01/20/2018 12/22/2016 02/27/2016 10/17/2015 05/20/2015  PHQ - 2 Score 0 0 0 0 0 0 0  PHQ- 9 Score 0 - - - - - -    Fall Risk Fall Risk  07/02/2020 01/20/2018 04/04/2017 12/22/2016 01/30/2016  Falls in the past year? 0 No No No No  Number falls in past yr: 0 - - - -  Injury with Fall? 0 - - - -  Risk for fall due to : Medication side effect - - - -  Follow up Falls evaluation completed;Falls prevention discussed - - - -    FALL RISK PREVENTION PERTAINING TO THE HOME:  Any stairs in or around the home? No  If so, are there any without handrails? No  Home free of loose throw rugs in walkways, pet beds, electrical cords, etc? Yes  Adequate lighting in your home to reduce risk of falls? Yes   ASSISTIVE DEVICES UTILIZED TO PREVENT FALLS:  Life alert? No  Use of a cane, walker or w/c? No  Grab bars in the bathroom? No  Shower chair or bench in shower? No  Elevated toilet seat or a handicapped toilet? No   Cognitive Function:    Normal cognitive status assessed by direct observation by this Nurse Health Advisor. No abnormalities found.      Immunizations Immunization History  Administered Date(s) Administered   Fluad Quad(high Dose 65+) 08/14/2019   Influenza Whole 10/20/2009   Influenza, High Dose  Seasonal PF 11/02/2018   Influenza,inj,Quad PF,6+ Mos 09/27/2014   Influenza-Unspecified 10/20/2016, 08/14/2019   Td 10/17/2008   Zoster, Live 08/20/2015    TDAP status: Due, Education has been provided regarding the importance of this vaccine. Advised may receive this vaccine at local pharmacy or Health Dept. Aware to provide a copy of the vaccination record if obtained from local pharmacy or Health Dept. Verbalized acceptance and understanding.  Flu Vaccine status: Up to  date  Pneumococcal vaccine status: Due, Education has been provided regarding the importance of this vaccine. Advised may receive this vaccine at local pharmacy or Health Dept. Aware to provide a copy of the vaccination record if obtained from local pharmacy or Health Dept. Verbalized acceptance and understanding.  Covid-19 vaccine status: Completed vaccines  Qualifies for Shingles Vaccine? Yes   Zostavax completed No   Shingrix Completed?: No.    Education has been provided regarding the importance of this vaccine. Patient has been advised to call insurance company to determine out of pocket expense if they have not yet received this vaccine. Advised may also receive vaccine at local pharmacy or Health Dept. Verbalized acceptance and understanding.  Screening Tests Health Maintenance  Topic Date Due   COVID-19 Vaccine (1) Never done   Zoster Vaccines- Shingrix (1 of 2) Never done   PNA vac Low Risk Adult (1 of 2 - PCV13) Never done   TETANUS/TDAP  10/17/2018   INFLUENZA VACCINE  07/20/2021   MAMMOGRAM  12/05/2022   COLONOSCOPY (Pts 45-69yrs Insurance coverage will need to be confirmed)  12/08/2023   DEXA SCAN  Completed   Hepatitis C Screening  Completed   HPV VACCINES  Aged Out    Health Maintenance  Health Maintenance Due  Topic Date Due   COVID-19 Vaccine (1) Never done   Zoster Vaccines- Shingrix (1 of 2) Never done   PNA vac Low Risk Adult (1 of 2 - PCV13) Never done   TETANUS/TDAP  10/17/2018     Colorectal cancer screening: Type of screening: Colonoscopy. Completed 12/07/2013. Repeat every 10 years  Mammogram status: Completed 12/05/2020. Repeat every year  Bone Density status: Completed 12/05/2020. Results reflect: Bone density results: OSTEOPENIA. Repeat every 5 years.  Lung Cancer Screening: (Low Dose CT Chest recommended if Age 48-80 years, 30 pack-year currently smoking OR have quit w/in 15years.) does not qualify.   Lung Cancer Screening Referral: n/a  Additional Screening:  Hepatitis C Screening: does not qualify; Completed 11/29/2016  Vision Screening: Recommended annual ophthalmology exams for early detection of glaucoma and other disorders of the eye. Is the patient up to date with their annual eye exam?  Yes  Who is the provider or what is the name of the office in which the patient attends annual eye exams? Dr.Grote  If pt is not established with a provider, would they like to be referred to a provider to establish care? No .   Dental Screening: Recommended annual dental exams for proper oral hygiene  Community Resource Referral / Chronic Care Management: CRR required this visit?  No   CCM required this visit?  No      Plan:     I have personally reviewed and noted the following in the patient's chart:   Medical and social history Use of alcohol, tobacco or illicit drugs  Current medications and supplements including opioid prescriptions.  Functional ability and status Nutritional status Physical activity Advanced directives List of other physicians Hospitalizations, surgeries, and ER visits in previous 12 months Vitals Screenings to include cognitive, depression, and falls Referrals and appointments  In addition, I have reviewed and discussed with patient certain preventive protocols, quality metrics, and best practice recommendations. A written personalized care plan for preventive services as well as general preventive health recommendations  were provided to patient.     Randel Pigg, LPN   [id number]   Nurse Notes: none

## 2021-07-06 MED ORDER — POTASSIUM CHLORIDE CRYS ER 20 MEQ PO TBCR: EXTENDED_RELEASE_TABLET | ORAL | 0 refills | Status: DC

## 2021-07-07 ENCOUNTER — Other Ambulatory Visit: Payer: Self-pay | Admitting: Family Medicine

## 2021-07-07 DIAGNOSIS — Z760 Encounter for issue of repeat prescription: Secondary | ICD-10-CM

## 2021-07-13 ENCOUNTER — Other Ambulatory Visit: Payer: Self-pay | Admitting: Family Medicine

## 2021-07-15 DIAGNOSIS — D631 Anemia in chronic kidney disease: Secondary | ICD-10-CM | POA: Diagnosis not present

## 2021-07-15 DIAGNOSIS — I129 Hypertensive chronic kidney disease with stage 1 through stage 4 chronic kidney disease, or unspecified chronic kidney disease: Secondary | ICD-10-CM | POA: Diagnosis not present

## 2021-07-15 DIAGNOSIS — N183 Chronic kidney disease, stage 3 unspecified: Secondary | ICD-10-CM | POA: Diagnosis not present

## 2021-07-15 DIAGNOSIS — M109 Gout, unspecified: Secondary | ICD-10-CM | POA: Diagnosis not present

## 2021-07-15 DIAGNOSIS — N189 Chronic kidney disease, unspecified: Secondary | ICD-10-CM | POA: Diagnosis not present

## 2021-07-15 DIAGNOSIS — E213 Hyperparathyroidism, unspecified: Secondary | ICD-10-CM | POA: Diagnosis not present

## 2021-07-24 DIAGNOSIS — N183 Chronic kidney disease, stage 3 unspecified: Secondary | ICD-10-CM | POA: Diagnosis not present

## 2021-07-31 ENCOUNTER — Other Ambulatory Visit: Payer: Self-pay | Admitting: Family Medicine

## 2021-07-31 DIAGNOSIS — Z760 Encounter for issue of repeat prescription: Secondary | ICD-10-CM

## 2021-08-05 ENCOUNTER — Other Ambulatory Visit: Payer: Self-pay | Admitting: Family Medicine

## 2021-08-18 ENCOUNTER — Other Ambulatory Visit: Payer: Self-pay | Admitting: Family Medicine

## 2021-08-18 DIAGNOSIS — Z760 Encounter for issue of repeat prescription: Secondary | ICD-10-CM

## 2021-08-18 IMAGING — MG DIGITAL DIAGNOSTIC BILAT W/ TOMO W/ CAD
6 of 12 series · 6 of 36 positions shown · non-contrast
Comparison: 12/05/2019

CLINICAL DATA: Patient returns after new baseline screening exam
for evaluation bilateral breast masses.

EXAM:
DIGITAL DIAGNOSTIC BILATERAL MAMMOGRAM WITH CAD AND TOMO
ULTRASOUND BILATERAL BREAST

[L MLO synth-2D (1 of 2)]
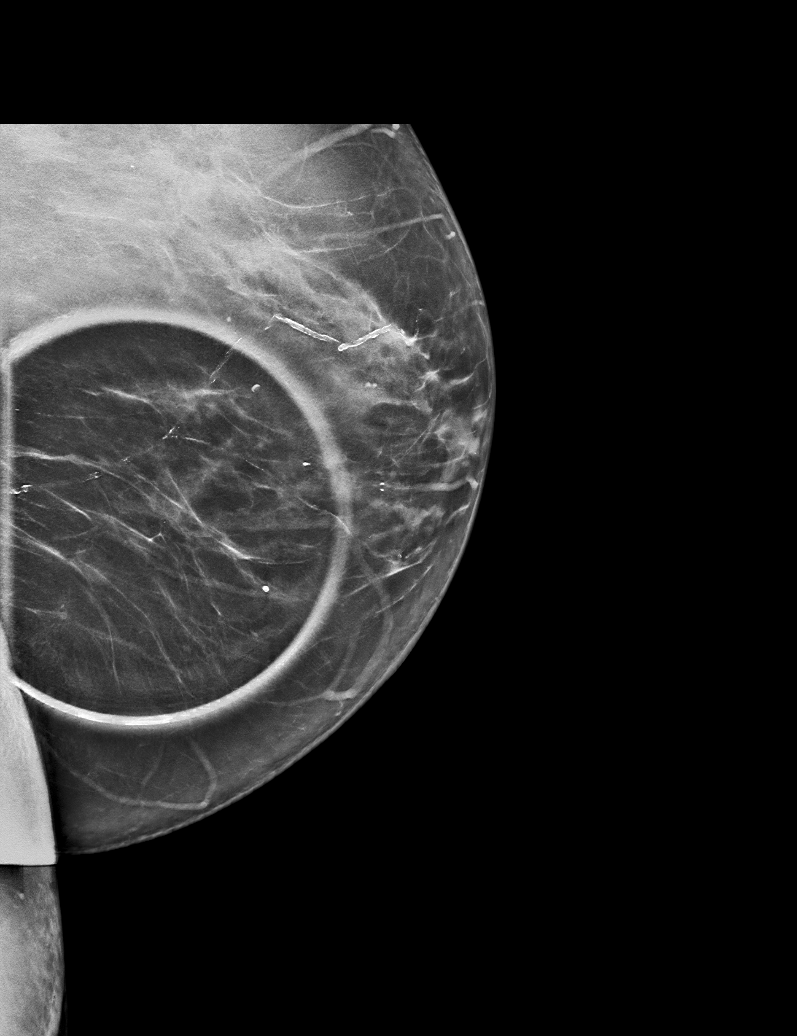

[L CC synth-2D (1 of 2)]
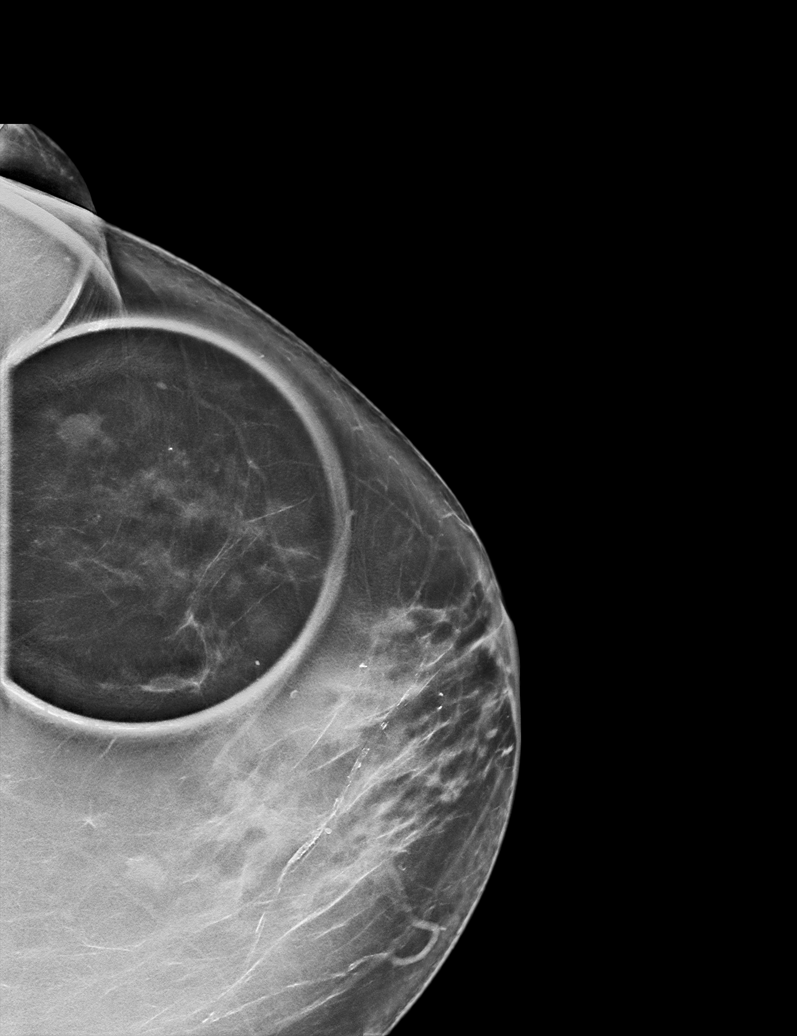

[R MLO synth-2D]
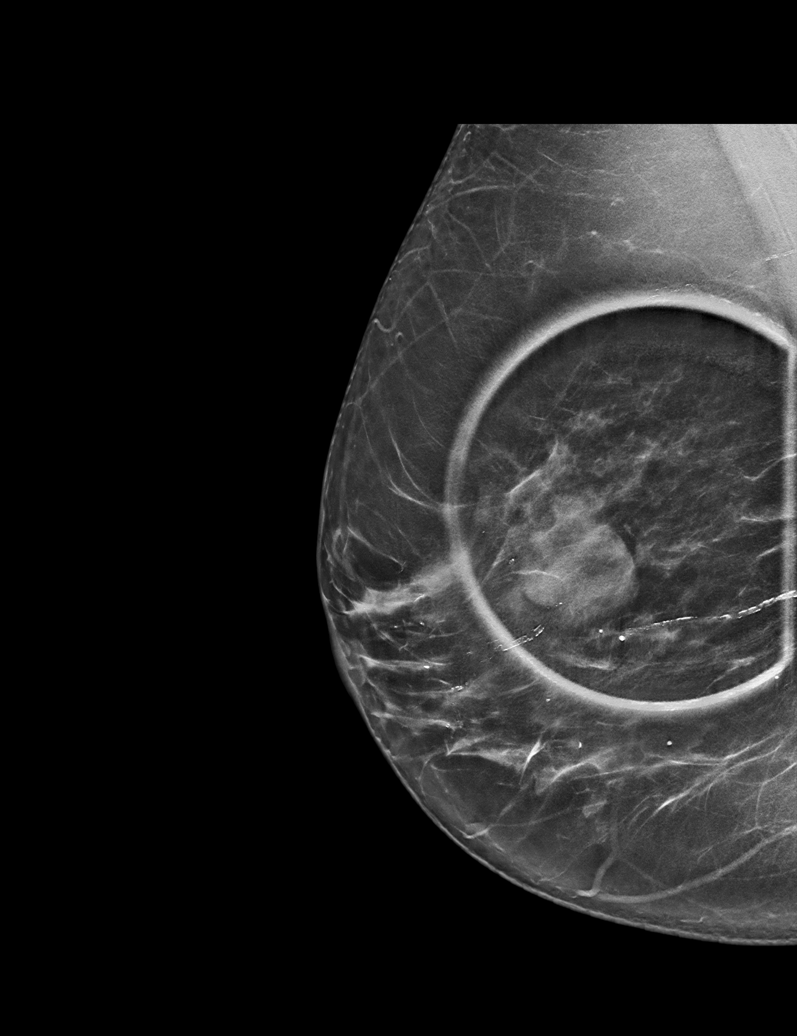

[R CC synth-2D]
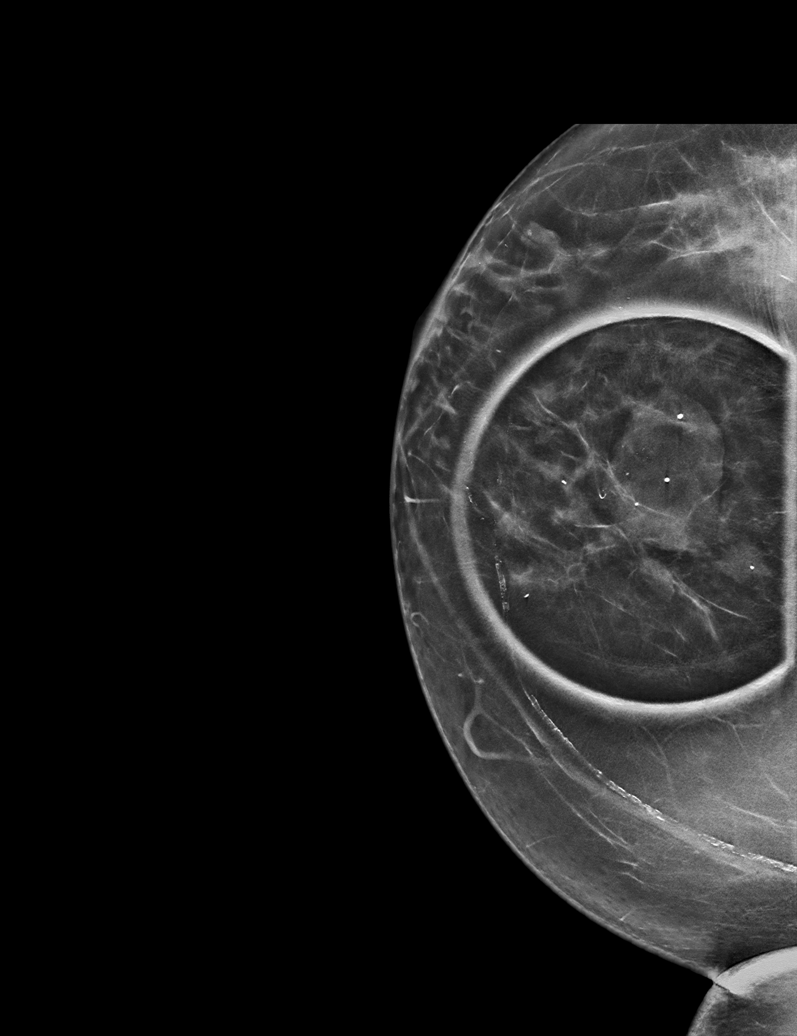

[L CC synth-2D (2 of 2)]
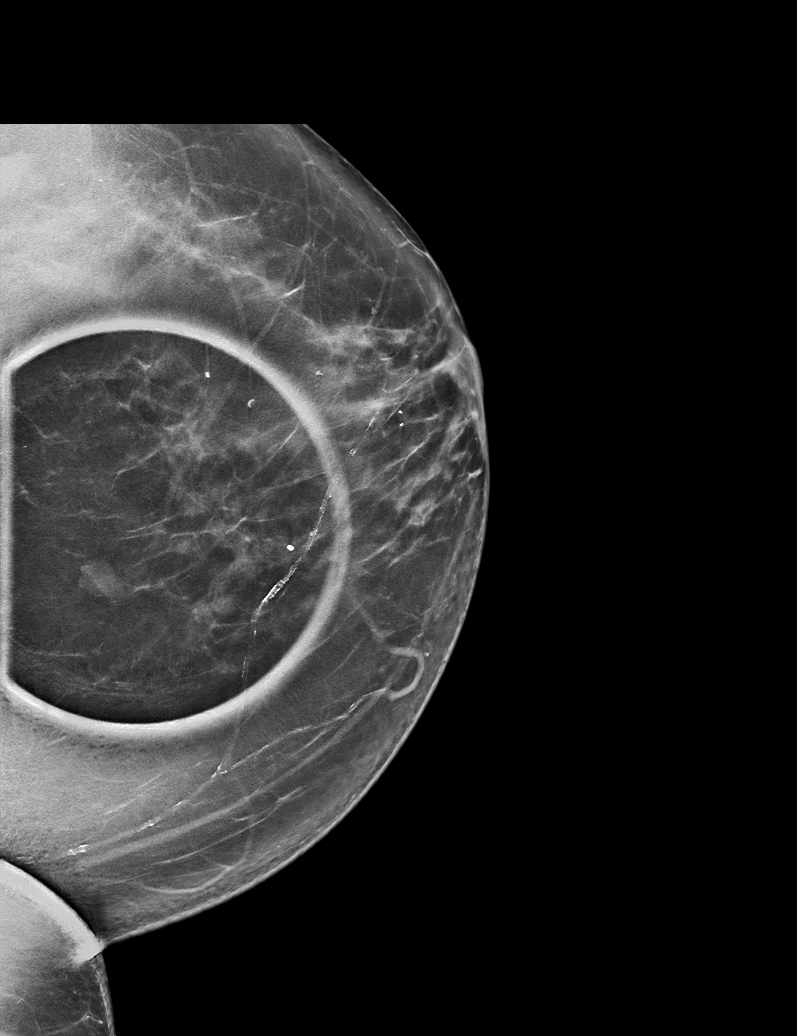

[L MLO synth-2D (2 of 2)]
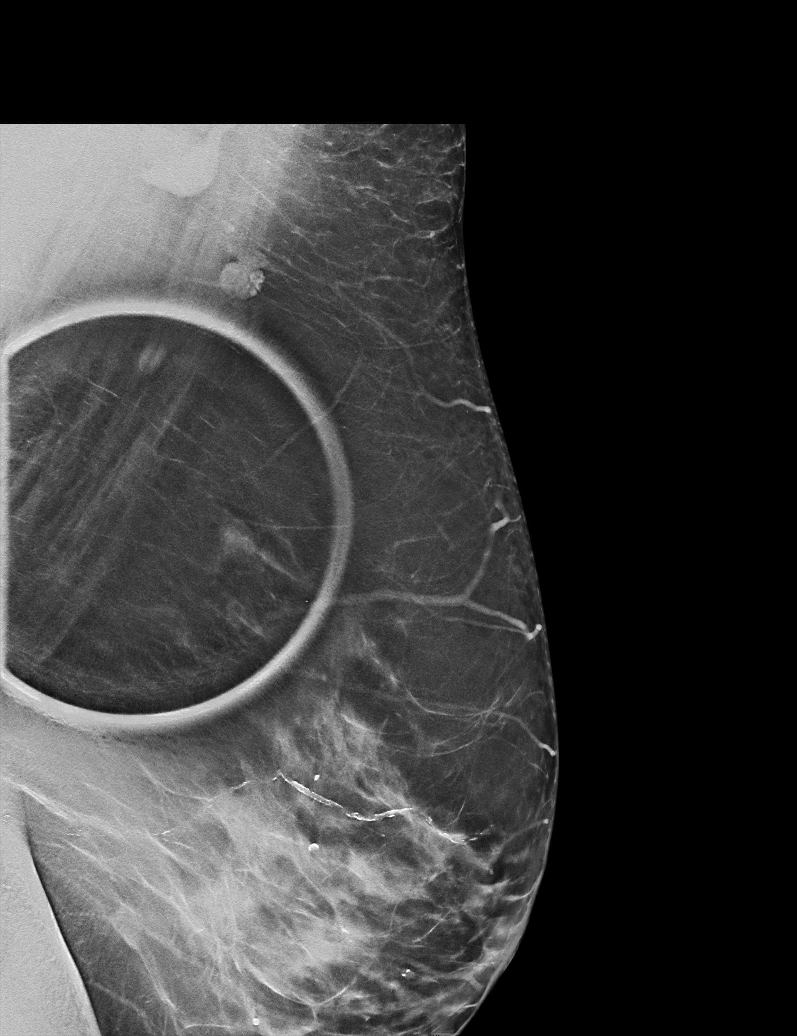

[6 of 36 positions shown; findings below may reference images not displayed]

ACR Breast Density Category c: The breast tissue is heterogeneously
dense, which may obscure small masses.
FINDINGS: Right breast:

Mammogram: Additional 2-D and 3-D images are performed. These views
confirm presence of an oval circumscribed mass in the retroareolar
region of the RIGHT breast. Mammographic images were processed with
CAD.

Physical Exam: I palpate discrete soft mass in the 1 o'clock
retroareolar region of the RIGHT breast.

Ultrasound: Targeted ultrasound is performed, showing a simple cyst
in the 1 o'clock location of the RIGHT breast 1 centimeter from the
nipple which measures 2.4 x 2.8 x 1.2 centimeters. Other simple and
minimally complicated cysts are identified in the retroareolar
region.

Left breast:

Mammogram: A partially obscured circumscribed mass is confirmed in
the LOWER INNER QUADRANT of the LEFT breast. A similar mass is also
confirmed in the UPPER OUTER QUADRANT of the LEFT breast.
Mammographic images were processed with CAD.

Ultrasound: Targeted ultrasound is performed, showing a group of
microcysts in the 8 o'clock location of the LEFT breast 3
centimeters from the nipple which measures 1.1 x 0.4 x
centimeters.

A simple cyst is identified in the 3 o'clock location of the LEFT
breast 6 centimeters from nipple measuring 0.6 x 0.5 x
centimeters. Additional cysts are identified throughout the
UPPER-OUTER QUADRANT of the LEFT breast.
IMPRESSION: Bilateral benign fibrocystic changes.

RECOMMENDATION:
Screening mammogram in one year.(Code:JB-[id number])

I have discussed the findings and recommendations with the patient.
If applicable, a reminder letter will be sent to the patient
regarding the next appointment.

BI-RADS CATEGORY  2: Benign.

## 2021-08-24 ENCOUNTER — Other Ambulatory Visit: Payer: Self-pay | Admitting: Family Medicine

## 2021-09-03 ENCOUNTER — Other Ambulatory Visit: Payer: Self-pay | Admitting: Family Medicine

## 2021-09-03 DIAGNOSIS — Z760 Encounter for issue of repeat prescription: Secondary | ICD-10-CM

## 2021-09-21 ENCOUNTER — Other Ambulatory Visit: Payer: Self-pay | Admitting: Family Medicine

## 2021-09-29 ENCOUNTER — Other Ambulatory Visit: Payer: Self-pay | Admitting: Family Medicine

## 2021-09-29 DIAGNOSIS — Z760 Encounter for issue of repeat prescription: Secondary | ICD-10-CM

## 2021-10-05 DIAGNOSIS — H0102B Squamous blepharitis left eye, upper and lower eyelids: Secondary | ICD-10-CM | POA: Diagnosis not present

## 2021-10-05 DIAGNOSIS — H0102A Squamous blepharitis right eye, upper and lower eyelids: Secondary | ICD-10-CM | POA: Diagnosis not present

## 2021-10-05 DIAGNOSIS — H1045 Other chronic allergic conjunctivitis: Secondary | ICD-10-CM | POA: Diagnosis not present

## 2021-10-05 DIAGNOSIS — H25813 Combined forms of age-related cataract, bilateral: Secondary | ICD-10-CM | POA: Diagnosis not present

## 2021-10-05 DIAGNOSIS — H04123 Dry eye syndrome of bilateral lacrimal glands: Secondary | ICD-10-CM | POA: Diagnosis not present

## 2021-10-05 DIAGNOSIS — H02885 Meibomian gland dysfunction left lower eyelid: Secondary | ICD-10-CM | POA: Diagnosis not present

## 2021-10-14 ENCOUNTER — Other Ambulatory Visit: Payer: Self-pay | Admitting: Family Medicine

## 2021-10-21 ENCOUNTER — Other Ambulatory Visit: Payer: Self-pay

## 2021-10-21 ENCOUNTER — Ambulatory Visit (INDEPENDENT_AMBULATORY_CARE_PROVIDER_SITE_OTHER): Payer: Medicare Other | Admitting: Family Medicine

## 2021-10-21 VITALS — BP 152/86 | HR 76 | Temp 98.2°F | Wt 229.6 lb

## 2021-10-21 DIAGNOSIS — R0789 Other chest pain: Secondary | ICD-10-CM

## 2021-10-21 DIAGNOSIS — E1122 Type 2 diabetes mellitus with diabetic chronic kidney disease: Secondary | ICD-10-CM | POA: Diagnosis not present

## 2021-10-21 DIAGNOSIS — E213 Hyperparathyroidism, unspecified: Secondary | ICD-10-CM

## 2021-10-21 DIAGNOSIS — K5900 Constipation, unspecified: Secondary | ICD-10-CM

## 2021-10-21 DIAGNOSIS — N1832 Chronic kidney disease, stage 3b: Secondary | ICD-10-CM

## 2021-10-21 DIAGNOSIS — I10 Essential (primary) hypertension: Secondary | ICD-10-CM | POA: Diagnosis not present

## 2021-10-21 DIAGNOSIS — R0602 Shortness of breath: Secondary | ICD-10-CM | POA: Diagnosis not present

## 2021-10-21 LAB — CBC WITH DIFFERENTIAL/PLATELET
Basophils Absolute: 0 10*3/uL (ref 0.0–0.1)
Basophils Relative: 0.5 % (ref 0.0–3.0)
Eosinophils Absolute: 0.2 10*3/uL (ref 0.0–0.7)
Eosinophils Relative: 4.3 % (ref 0.0–5.0)
HCT: 36.9 % (ref 36.0–46.0)
Hemoglobin: 11.8 g/dL — ABNORMAL LOW (ref 12.0–15.0)
Lymphocytes Relative: 54.3 % — ABNORMAL HIGH (ref 12.0–46.0)
Lymphs Abs: 2.1 10*3/uL (ref 0.7–4.0)
MCHC: 32 g/dL (ref 30.0–36.0)
MCV: 87.2 fl (ref 78.0–100.0)
Monocytes Absolute: 0.4 10*3/uL (ref 0.1–1.0)
Monocytes Relative: 9.7 % (ref 3.0–12.0)
Neutro Abs: 1.2 10*3/uL — ABNORMAL LOW (ref 1.4–7.7)
Neutrophils Relative %: 31.2 % — ABNORMAL LOW (ref 43.0–77.0)
Platelets: 301 10*3/uL (ref 150.0–400.0)
RBC: 4.23 Mil/uL (ref 3.87–5.11)
RDW: 14.4 % (ref 11.5–15.5)
WBC: 3.9 10*3/uL — ABNORMAL LOW (ref 4.0–10.5)

## 2021-10-21 LAB — COMPREHENSIVE METABOLIC PANEL
ALT: 10 U/L (ref 0–35)
AST: 14 U/L (ref 0–37)
Albumin: 4.2 g/dL (ref 3.5–5.2)
Alkaline Phosphatase: 67 U/L (ref 39–117)
BUN: 18 mg/dL (ref 6–23)
CO2: 29 mEq/L (ref 19–32)
Calcium: 10.2 mg/dL (ref 8.4–10.5)
Chloride: 103 mEq/L (ref 96–112)
Creatinine, Ser: 1.59 mg/dL — ABNORMAL HIGH (ref 0.40–1.20)
GFR: 33.13 mL/min — ABNORMAL LOW (ref >60.00)
Glucose, Bld: 86 mg/dL (ref 70–99)
Potassium: 3.6 mEq/L (ref 3.5–5.1)
Sodium: 141 mEq/L (ref 135–145)
Total Bilirubin: 0.3 mg/dL (ref 0.2–1.2)
Total Protein: 7.6 g/dL (ref 6.0–8.3)

## 2021-10-21 LAB — BRAIN NATRIURETIC PEPTIDE: Pro B Natriuretic peptide (BNP): 13 pg/mL (ref 0.0–100.0)

## 2021-10-21 LAB — T4, FREE: Free T4: 0.62 ng/dL (ref 0.60–1.60)

## 2021-10-21 LAB — HEMOGLOBIN A1C: Hgb A1c MFr Bld: 6.1 % (ref 4.6–6.5)

## 2021-10-21 LAB — TSH: TSH: 1.43 u[IU]/mL (ref 0.35–5.50)

## 2021-10-21 NOTE — Progress Notes (Signed)
Subjective:    Patient ID: Jocelyn Ford, female    DOB: [birth year], 70 y.o.   MRN: [id number]  Chief Complaint  Patient presents with   Medication Refill    All     HPI Patient was seen today for med refill.  Patient endorses elevation in BP as seen by nephrology and triamterene-hydrochlorothiazide stopped.  Patient taking BP meds.  Patient notes intermittent shortness of breath times the last few weeks, abdominal bloating, constipation.  Patient can be at rest when SOB occurs.  Endorses brief chest pain/discomfort difficult to describe, in center of chest at times with SOB.  Denies orthopnea, LE edema, cough.  Eating more broccoli, cauliflower, greens, fruits, and drinking ginger ale daily.  Also having healthy choice frozen dinners.  Past Medical History:  Diagnosis Date   Acute gouty arthropathy    Allergic rhinitis, cause unspecified    Arthritis    Chronic kidney disease    Dizziness and giddiness    Dysuria    Esophageal reflux    Heart murmur    Iron deficiency anemia, unspecified    Nonspecific abnormal electrocardiogram (ECG) (EKG)    Osteoporosis, unspecified    Pure hypercholesterolemia    Routine general medical examination at a health care facility    Unspecified essential hypertension     Allergies  Allergen Reactions   Doxycycline    Nifedipine     REACTION: Nausea,weakness    ROS General: Denies fever, chills, night sweats, changes in weight, changes in appetite HEENT: Denies headaches, ear pain, changes in vision, rhinorrhea, sore throat CV: Denies palpitations, orthopnea +SOB, CP Pulm: Denies cough, wheezing +SOB GI: Denies abdominal pain, nausea, vomiting, diarrhea +bloating, constiption GU: Denies dysuria, hematuria, frequency, vaginal discharge Msk: Denies muscle cramps, joint pains Neuro: Denies weakness, numbness, tingling Skin: Denies rashes, bruising Psych: Denies depression, anxiety, hallucinations     Objective:    Blood pressure  (!) 152/86, pulse 76, temperature 98.2 F (36.8 C), temperature source Oral, weight 229 lb 9.6 oz (104.1 kg), SpO2 98 %.  Gen. Pleasant, well-nourished, in no distress, normal affect   HEENT: [place]/AT, face symmetric, conjunctiva clear, no scleral icterus, PERRLA, EOMI, nares patent without drainage, pharynx without erythema or exudate. Neck: No JVD, no thyromegaly, no carotid bruits Lungs: no accessory muscle use, CTAB, no wheezes or rales Cardiovascular: RRR, no m/r/g, no peripheral edema Musculoskeletal: No deformities, no cyanosis or clubbing, normal tone Neuro:  A&Ox3, CN II-XII intact, normal gait Skin:  Warm, no lesions/ rash   Wt Readings from Last 3 Encounters:  10/21/21 229 lb 9.6 oz (104.1 kg)  07/02/20 220 lb (99.8 kg)  02/13/20 225 lb (102.1 kg)    Lab Results  Component Value Date   WBC 3.7 (L) 02/13/2020   HGB 11.8 (L) 02/13/2020   HCT 35.8 (L) 02/13/2020   PLT 315.0 02/13/2020   GLUCOSE 100 (H) 02/13/2020   CHOL 205 (H) 02/13/2020   TRIG 111.0 02/13/2020   HDL 37.10 (L) 02/13/2020   LDLDIRECT 194.6 08/21/2013   LDLCALC 146 (H) 02/13/2020   ALT 9 08/01/2018   AST 12 08/01/2018   NA 140 02/13/2020   K 3.7 02/13/2020   CL 101 02/13/2020   CREATININE 1.66 (H) 02/13/2020   BUN 21 02/13/2020   CO2 29 02/13/2020   TSH 1.39 02/13/2020   INR 1.1 (H) 01/22/2015   HGBA1C 6.8 (H) 02/13/2020    Assessment/Plan:  Essential hypertension -Elevated -Continue current medications including Norvasc, atenolol, losartan 50 mg daily. -  Continue to hold triamterene-hydrochlorothiazide as well as Lasix given CKD -We will likely need additional BP medication/medication adjustments -We will obtain labs -Lifestyle modification strongly encouraged including limiting sodium intake -Continue follow-up with nephrology  Constipation, unspecified constipation type -Discussed lifestyle modifications including eating more vegetables and increasing water intake -Daily bowel regimen  such as MiraLAX encouraged - Plan: CMP, TSH, T4, Free  Hyperparathyroidism ([place]) -Stable - Plan: CMP, Parathyroid hormone, intact (no Ca)  SOB (shortness of breath) -Discussed possible causes including anxiety, CHF, GERD, reactive airway disease -6-minute walk test normal. -EKG NSR -We will obtain labs - Plan: Brain Natriuretic Peptide, CBC with Differential/Platelet, D-dimer, Quantitative, EKG 12-Lead  Type 2 diabetes mellitus with stage 3b chronic kidney disease, without long-term current use of insulin (HCC)  -Hemoglobin A1c 6.8% on 02/13/2020 -Repeat labs -Lifestyle modifications -We will have patient return in 1-2 weeks to further discuss -Continue follow-up with nephrology. -Avoid nephrotoxic meds and renally dose medications. - Plan: CMP, Hemoglobin A1c, Microalbumin/Creatinine Ratio, Urine  Other chest pain -Concern for CVD given history of HTN, renal disease, and diabetes -EKG NSR with nonspecific flat t waves, and T wave inversion in III and V2.  EKG from 12/05/2017 reviewed for comparison. - Plan: EKG 12-Lead  F/u in 1-2 wks  Grier Mitts, MD

## 2021-10-21 NOTE — Patient Instructions (Addendum)
We will have you follow-up in the next 1-2 weeks to further discuss labs and make medication adjustments as needed.

## 2021-10-22 LAB — PARATHYROID HORMONE, INTACT (NO CA): PTH: 39 pg/mL (ref 16–77)

## 2021-10-22 LAB — D-DIMER, QUANTITATIVE: D-Dimer, Quant: 1.96 mcg/mL FEU — ABNORMAL HIGH (ref <0.50)

## 2021-10-23 ENCOUNTER — Encounter (HOSPITAL_COMMUNITY): Payer: Self-pay

## 2021-10-23 ENCOUNTER — Emergency Department (HOSPITAL_COMMUNITY)
Admission: EM | Admit: 2021-10-23 | Discharge: 2021-10-24 | Disposition: A | Discharge status: Home / Self Care | Payer: Medicare Other | Attending: Emergency Medicine | Admitting: Emergency Medicine

## 2021-10-23 ENCOUNTER — Other Ambulatory Visit: Payer: Self-pay | Admitting: Family Medicine

## 2021-10-23 ENCOUNTER — Emergency Department (HOSPITAL_COMMUNITY): Payer: Medicare Other

## 2021-10-23 ENCOUNTER — Encounter: Payer: Self-pay | Admitting: Family Medicine

## 2021-10-23 DIAGNOSIS — I129 Hypertensive chronic kidney disease with stage 1 through stage 4 chronic kidney disease, or unspecified chronic kidney disease: Secondary | ICD-10-CM | POA: Diagnosis not present

## 2021-10-23 DIAGNOSIS — R0602 Shortness of breath: Secondary | ICD-10-CM

## 2021-10-23 DIAGNOSIS — Z87891 Personal history of nicotine dependence: Secondary | ICD-10-CM | POA: Insufficient documentation

## 2021-10-23 DIAGNOSIS — Z79899 Other long term (current) drug therapy: Secondary | ICD-10-CM | POA: Diagnosis not present

## 2021-10-23 DIAGNOSIS — N183 Chronic kidney disease, stage 3 unspecified: Secondary | ICD-10-CM | POA: Insufficient documentation

## 2021-10-23 DIAGNOSIS — R7989 Other specified abnormal findings of blood chemistry: Secondary | ICD-10-CM

## 2021-10-23 DIAGNOSIS — Z8673 Personal history of transient ischemic attack (TIA), and cerebral infarction without residual deficits: Secondary | ICD-10-CM | POA: Diagnosis not present

## 2021-10-23 DIAGNOSIS — M7989 Other specified soft tissue disorders: Secondary | ICD-10-CM | POA: Insufficient documentation

## 2021-10-23 DIAGNOSIS — R911 Solitary pulmonary nodule: Secondary | ICD-10-CM | POA: Diagnosis not present

## 2021-10-23 DIAGNOSIS — R600 Localized edema: Secondary | ICD-10-CM | POA: Insufficient documentation

## 2021-10-23 DIAGNOSIS — R079 Chest pain, unspecified: Secondary | ICD-10-CM | POA: Diagnosis not present

## 2021-10-23 DIAGNOSIS — Z20822 Contact with and (suspected) exposure to covid-19: Secondary | ICD-10-CM | POA: Diagnosis not present

## 2021-10-23 LAB — CBC WITH DIFFERENTIAL/PLATELET
Abs Immature Granulocytes: 0.01 10*3/uL (ref 0.00–0.07)
Basophils Absolute: 0 10*3/uL (ref 0.0–0.1)
Basophils Relative: 1 %
Eosinophils Absolute: 0.2 10*3/uL (ref 0.0–0.5)
Eosinophils Relative: 4 %
HCT: 37.7 % (ref 36.0–46.0)
Hemoglobin: 12.3 g/dL (ref 12.0–15.0)
Immature Granulocytes: 0 %
Lymphocytes Relative: 46 %
Lymphs Abs: 2.5 10*3/uL (ref 0.7–4.0)
MCH: 28.5 pg (ref 26.0–34.0)
MCHC: 32.6 g/dL (ref 30.0–36.0)
MCV: 87.3 fL (ref 80.0–100.0)
Monocytes Absolute: 0.5 10*3/uL (ref 0.1–1.0)
Monocytes Relative: 9 %
Neutro Abs: 2.2 10*3/uL (ref 1.7–7.7)
Neutrophils Relative %: 40 %
Platelets: 321 10*3/uL (ref 150–400)
RBC: 4.32 MIL/uL (ref 3.87–5.11)
RDW: 13.6 % (ref 11.5–15.5)
WBC: 5.5 10*3/uL (ref 4.0–10.5)
nRBC: 0 % (ref 0.0–0.2)

## 2021-10-23 LAB — COMPREHENSIVE METABOLIC PANEL
ALT: 12 U/L (ref 0–44)
AST: 19 U/L (ref 15–41)
Albumin: 3.9 g/dL (ref 3.5–5.0)
Alkaline Phosphatase: 62 U/L (ref 38–126)
Anion gap: 9 (ref 5–15)
BUN: 16 mg/dL (ref 8–23)
CO2: 27 mmol/L (ref 22–32)
Calcium: 10.2 mg/dL (ref 8.9–10.3)
Chloride: 103 mmol/L (ref 98–111)
Creatinine, Ser: 1.65 mg/dL — ABNORMAL HIGH (ref 0.44–1.00)
GFR, Estimated: 34 mL/min — ABNORMAL LOW (ref >60)
Glucose, Bld: 99 mg/dL (ref 70–99)
Potassium: 3.7 mmol/L (ref 3.5–5.1)
Sodium: 139 mmol/L (ref 135–145)
Total Bilirubin: 0.5 mg/dL (ref 0.3–1.2)
Total Protein: 7.5 g/dL (ref 6.5–8.1)

## 2021-10-23 LAB — TROPONIN I (HIGH SENSITIVITY)
Troponin I (High Sensitivity): 4 ng/L (ref <18)
Troponin I (High Sensitivity): 4 ng/L (ref <18)

## 2021-10-23 LAB — BRAIN NATRIURETIC PEPTIDE: B Natriuretic Peptide: 24.5 pg/mL (ref 0.0–100.0)

## 2021-10-23 MED ORDER — SODIUM CHLORIDE 0.9 % IV BOLUS
500.0000 mL | Freq: Once | INTRAVENOUS | Status: AC
  Administered 2021-10-23: 500 mL via INTRAVENOUS

## 2021-10-23 MED ORDER — IOHEXOL 350 MG/ML SOLN
60.0000 mL | Freq: Once | INTRAVENOUS | Status: AC | PRN
  Administered 2021-10-23: 60 mL via INTRAVENOUS

## 2021-10-23 NOTE — ED Provider Notes (Signed)
[facility] Provider Note   CSN: [id number] Arrival date & time: 10/23/21  1632     History Chief Complaint  Patient presents with   Chest Pain    Jocelyn Ford is a 70 y.o. female.  The history is provided by the patient and medical records.  Chest Pain Jocelyn Ford is a 70 y.o. female who presents to the Emergency Department complaining of dyspnea. Referred from PCP. She presents the emergency department upon referral from her PCP for evaluation of shortness of breath. She reports feeling fatigued for about two weeks and has been experiencing episodic dyspnea for about one week. At times she wakes up in the night and feels like her chest is beating very hard and she is short of breath. She also has intermittent exertional dyspnea. She reports one day of right lower extremity edema. She has chest discomfort at times. No fevers, nausea, vomiting, diarrhea. She does have some left-sided abdominal discomfort and constipation. She went to her PCP for evaluation of the symptoms and had outpatient labs performed. Labs returned significant for elevated D dimer of 1.96. She does have a history of hypertension, CKD.    Past Medical History:  Diagnosis Date   Acute gouty arthropathy    Allergic rhinitis, cause unspecified    Arthritis    Chronic kidney disease    Dizziness and giddiness    Dysuria    Esophageal reflux    Heart murmur    Iron deficiency anemia, unspecified    Nonspecific abnormal electrocardiogram (ECG) (EKG)    Osteoporosis, unspecified    Pure hypercholesterolemia    Routine general medical examination at a health care facility    Unspecified essential hypertension     Patient Active Problem List   Diagnosis Date Noted   Primary osteoarthritis of both hands 10/31/2018   Primary osteoarthritis of both feet 10/31/2018   Leg swelling 08/01/2018   Paresthesia 04/24/2018   Asymptomatic menopausal state 12/11/2016    Hyperparathyroidism ([place]) 11/29/2016   Dyspnea 08/18/2016   Chronic tension-type headache, not intractable 01/30/2016   Dizziness and giddiness 01/07/2016   Headache 01/07/2016   Fatigue 01/07/2016   Renal insufficiency 08/20/2015   Abnormal stress test    Chest pain 09/27/2014   Menopausal state 09/27/2014   Gout 08/08/2014   Routine general medical examination at a health care facility 10/03/2013   Hypokalemia 10/03/2013   Encounter for long-term (current) use of other medications 08/21/2013   Idiopathic chronic gout of multiple sites without tophus 07/18/2009   Anemia, iron deficiency 11/07/2008   HYPERCHOLESTEROLEMIA 11/01/2008   ELECTROCARDIOGRAM, ABNORMAL 11/01/2008   Essential hypertension 07/28/2007   ALLERGIC RHINITIS 07/28/2007   GERD 07/28/2007   Osteoporosis 07/28/2007    Past Surgical History:  Procedure Laterality Date   ABDOMINAL HYSTERECTOMY     LEFT HEART CATHETERIZATION WITH CORONARY ANGIOGRAM N/A 01/28/2015   Procedure: LEFT HEART CATHETERIZATION WITH CORONARY ANGIOGRAM;  Surgeon: Candee Furbish, MD;  Location: [facility] CATH LAB;  Service: Cardiovascular;  Laterality: N/A;     OB History   No obstetric history on file.     Family History  Problem Relation Age of Onset   Cancer Father        Prostate   Hypertension Father    Alzheimer's disease Mother    Hypertension Mother    Kidney disease Mother    Diabetes Sister    Hypertension Sister    Heart Problems Brother  stent   Hypertension Brother    Healthy Daughter    Hypertension Sister    Breast cancer Sister     Social History   Tobacco Use   Smoking status: Former    Packs/day: 1.00    Years: 10.00    Pack years: 10.00    Types: Cigarettes    Quit date: 01/02/1985    Years since quitting: 36.8   Smokeless tobacco: Never  Vaping Use   Vaping Use: Never used  Substance Use Topics   Alcohol use: No    Alcohol/week: 0.0 standard drinks   Drug use: Never    Home Medications Prior  to Admission medications   Medication Sig Start Date End Date Taking? Authorizing Provider  allopurinol (ZYLOPRIM) 100 MG tablet Take 200 mg by mouth daily. 04/11/20  Yes [provider]  amLODipine (NORVASC) 2.5 MG tablet TAKE 1 TABLET BY MOUTH EVERY DAY Patient taking differently: Take 2.5 mg by mouth daily. 09/21/21  Yes [name], MD  aspirin-acetaminophen-caffeine (EXCEDRIN MIGRAINE) 802-525-1000 MG tablet Take 2 tablets by mouth every 6 (six) hours as needed for headache or migraine.   Yes [provider]  atenolol (TENORMIN) 25 MG tablet TAKE 1 TABLET BY MOUTH TWICE A DAY Patient taking differently: Take 25 mg by mouth 2 (two) times daily. 09/21/21  Yes [name], MD  cholecalciferol (VITAMIN D) 25 MCG (1000 UNIT) tablet Take 1,000 Units by mouth daily.   Yes [provider]  FERROUS SULFATE PO Take 1 tablet by mouth daily.   Yes [provider]  fluticasone (FLONASE) 50 MCG/ACT nasal spray SPRAY 1 SPRAY INTO BOTH NOSTRILS DAILY. Patient taking differently: Place 1 spray into both nostrils daily as needed for allergies. 06/01/21  Yes [name], MD  furosemide (LASIX) 40 MG tablet TAKE 1 TABLET BY MOUTH EVERY DAY. PATIENT NEEDS OFFICE VISIT Patient taking differently: Take 40 mg by mouth daily. 07/13/21  Yes [name], MD  losartan (COZAAR) 50 MG tablet Take 50 mg by mouth daily. 04/11/20  Yes [provider]  potassium chloride SA (KLOR-CON M20) 20 MEQ tablet TAKE 3 TABLETS BY MOUTH 3 TIMES DAILY. -INS WILL ONLY PAY FOR 5 A DAY Patient taking differently: Take 60 mEq by mouth 3 (three) times daily. 09/29/21  Yes [name], MD    Allergies    Doxycycline and Nifedipine  Review of Systems   Review of Systems  Cardiovascular:  Positive for chest pain.  All other systems reviewed and are negative.  Physical Exam Updated Vital Signs BP (!) 165/80   Pulse 68   Temp 99.1 F (37.3 C) (Oral)   Resp 16   SpO2 100%    Physical Exam Vitals and nursing note reviewed.  Constitutional:      Appearance: She is well-developed.  HENT:     Head: Normocephalic and atraumatic.  Cardiovascular:     Rate and Rhythm: Normal rate and regular rhythm.     Heart sounds: No murmur heard. Pulmonary:     Effort: Pulmonary effort is normal. No respiratory distress.     Breath sounds: Normal breath sounds.  Abdominal:     Palpations: Abdomen is soft.     Tenderness: There is no abdominal tenderness. There is no guarding or rebound.  Musculoskeletal:        General: No tenderness.     Comments: Trace edema to bilateral lower extremity  Skin:    General: Skin is warm and dry.  Neurological:     Mental Status: She is alert and oriented to person, place, and time.  Psychiatric:        Behavior: Behavior normal.    ED Results / Procedures / Treatments   Labs (all labs ordered are listed, but only abnormal results are displayed) Labs Reviewed  COMPREHENSIVE METABOLIC PANEL - Abnormal; Notable for the following components:      Result Value   Creatinine, Ser 1.65 (*)    GFR, Estimated 34 (*)    All other components within normal limits  RESP PANEL BY RT-PCR (FLU A&B, COVID) ARPGX2  CBC WITH DIFFERENTIAL/PLATELET  BRAIN NATRIURETIC PEPTIDE  TROPONIN I (HIGH SENSITIVITY)  TROPONIN I (HIGH SENSITIVITY)    EKG EKG Interpretation  Date/Time:  Friday October 23 2021 19:14:22 EDT Ventricular Rate:  76 PR Interval:  182 QRS Duration: 84 QT Interval:  400 QTC Calculation: 450 R Axis:   2 Text Interpretation: Normal sinus rhythm Low voltage QRS Cannot rule out Anterior infarct , age undetermined Abnormal ECG Confirmed by Wynona Dove ([id number]) on 10/23/2021 9:31:43 PM  Radiology CT Angio Chest PE W and/or Wo Contrast  Result Date: 10/24/2021 CLINICAL DATA:  Chest pain and shortness of breath. EXAM: CT ANGIOGRAPHY CHEST WITH CONTRAST TECHNIQUE: Multidetector CT imaging of the chest was performed using the  standard protocol during bolus administration of intravenous contrast. Multiplanar CT image reconstructions and MIPs were obtained to evaluate the vascular anatomy. CONTRAST:  20mL OMNIPAQUE IOHEXOL 350 MG/ML SOLN COMPARISON:  None. FINDINGS: Cardiovascular: There is moderate to marked severity calcification of the aortic arch without evidence of aortic aneurysm. Moderate to marked severity atherosclerosis is seen throughout the descending thoracic aorta. Satisfactory opacification of the pulmonary arteries to the segmental level. No evidence of pulmonary embolism. Normal heart size. No pericardial effusion. Mediastinum/Nodes: No enlarged mediastinal, hilar, or axillary lymph nodes. Thyroid gland, trachea, and esophagus demonstrate no significant findings. Lungs/Pleura: A 4 mm noncalcified lung nodule versus focal atelectasis is seen within the anterolateral aspect of the right upper lobe (axial CT image 25, CT series 7). There is no evidence of acute infiltrate, pleural effusion or pneumothorax. Upper Abdomen: No acute abnormality. Musculoskeletal: No chest wall abnormality. No acute or significant osseous findings. Review of the MIP images confirms the above findings. IMPRESSION: 1. No evidence of pulmonary embolism or other acute intrathoracic process. 2. 4 mm right upper lobe noncalcified lung nodule versus focal atelectasis. No follow-up needed if patient is low-risk. Non-contrast chest CT can be considered in 12 months if patient is high-risk. This recommendation follows the consensus statement: Guidelines for Management of Incidental Pulmonary Nodules Detected on CT Images: From the Fleischner Society 2017; Radiology 2017; 284:228-243. 3. Aortic atherosclerosis. Aortic Atherosclerosis (ICD10-I70.0). Electronically Signed   By: Virgina Norfolk M.D.   On: 10/24/2021 00:09    Procedures Procedures   Medications Ordered in ED Medications  enoxaparin (LOVENOX) injection 100 mg (has no administration in  time range)  sodium chloride 0.9 % bolus 500 mL (0 mLs Intravenous Stopped 10/24/21 0050)  iohexol (OMNIPAQUE) 350 MG/ML injection 60 mL (60 mLs Intravenous Contrast Given 10/23/21 2327)    ED Course  I have reviewed the triage vital signs and the nursing notes.  Pertinent labs & imaging results that were available during my care of the patient were reviewed by me and considered in my medical decision making (see chart for details).    MDM Rules/Calculators/A&P  patient with history of hypertension, stage III CKD here for evaluation of intermittent shortness of breath, elevated outpatient D dimer. She is breathing comfortably in the emergency department but without respiratory distress. Her presenting blood pressure was significantly elevated, this did improve with time in the department. BMP with renal function near her baseline. A CTA was obtained, which is negative for central pulmonary embolism. It was a limited study for peripheral and subsegmental PEs. Given her renal insufficiency feel repeat imaging with repeat CTA is not necessary at this time. Will treat with one-time dose of Lovenox with plan for bilateral lower extremity vascular ultrasound to be performed later today. Presentation is not consistent with ACS, pneumonia. Discussed with patient incidental finding of pulmonary nodule and need for outpatient follow-up regarding this.  Final Clinical Impression(s) / ED Diagnoses Final diagnoses:  Shortness of breath  Pulmonary nodule    Rx / DC Orders ED Discharge Orders          Ordered    VAS [place] LOWER EXTREMITY VENOUS (DVT)  Status:  Canceled        10/24/21 0120    VAS [place] LOWER EXTREMITY VENOUS (DVT)  Status:  Canceled        10/24/21 0126    VAS [place] LOWER EXTREMITY VENOUS (DVT)  Status:  Canceled        10/24/21 0128    LE VENOUS        10/24/21 0129    VAS [place] LOWER EXTREMITY VENOUS (DVT)        10/24/21 0130             Quintella Reichert,  MD 10/24/21 0145

## 2021-10-23 NOTE — ED Triage Notes (Signed)
Pt reports that she has been having CP and SOB that has been ongoing, went to see PCP and they did lab work, told her she had high d dimer

## 2021-10-23 NOTE — ED Provider Notes (Signed)
Emergency Medicine Provider Triage Evaluation Note  Jocelyn Ford , a 70 y.o. female  was evaluated in triage.  Pt complains of shortness of breath and chest pain.  Has been present for about a week, worsened a few days ago.  Saw PCP who told her her D-dimer was elevated.  As she continues to have shortness of breath, she was instructed to come to the ER.  No leg pain.  Chest pain shortness of breath present with exertion.  Review of Systems  Positive: Chest pain, shortness of breath Negative: Fever, cough, leg pain  Physical Exam  BP (!) 210/100   Pulse 92   Temp 99.1 F (37.3 C) (Oral)   Resp 16   SpO2 97%  Gen:   Awake, no distress   Resp:  Normal effort  MSK:   Right lower leg pain.   Medical Decision Making  Medically screening exam initiated at 7:13 PM.  Appropriate orders placed.  Jocelyn Ford was informed that the remainder of the evaluation will be completed by another provider, this initial triage assessment does not replace that evaluation, and the importance of remaining in the ED until their evaluation is complete.  Labs, cta   [place], PA-C 10/23/21 Metz, [place] A, DO 10/23/21 2149

## 2021-10-24 ENCOUNTER — Other Ambulatory Visit: Payer: Self-pay

## 2021-10-24 ENCOUNTER — Other Ambulatory Visit: Payer: Self-pay | Admitting: Family Medicine

## 2021-10-24 ENCOUNTER — Ambulatory Visit (HOSPITAL_BASED_OUTPATIENT_CLINIC_OR_DEPARTMENT_OTHER)
Admission: RE | Admit: 2021-10-24 | Discharge: 2021-10-24 | Disposition: A | Discharge status: Home / Self Care | Payer: Medicare Other | Source: Ambulatory Visit | Attending: Emergency Medicine | Admitting: Emergency Medicine

## 2021-10-24 DIAGNOSIS — I639 Cerebral infarction, unspecified: Secondary | ICD-10-CM | POA: Diagnosis not present

## 2021-10-24 DIAGNOSIS — R0602 Shortness of breath: Secondary | ICD-10-CM | POA: Insufficient documentation

## 2021-10-24 DIAGNOSIS — R609 Edema, unspecified: Secondary | ICD-10-CM | POA: Insufficient documentation

## 2021-10-24 DIAGNOSIS — M7989 Other specified soft tissue disorders: Secondary | ICD-10-CM

## 2021-10-24 DIAGNOSIS — Z8673 Personal history of transient ischemic attack (TIA), and cerebral infarction without residual deficits: Secondary | ICD-10-CM | POA: Insufficient documentation

## 2021-10-24 DIAGNOSIS — Z760 Encounter for issue of repeat prescription: Secondary | ICD-10-CM

## 2021-10-24 LAB — RESP PANEL BY RT-PCR (FLU A&B, COVID) ARPGX2
Influenza A by PCR: NEGATIVE
Influenza B by PCR: NEGATIVE
SARS Coronavirus 2 by RT PCR: NEGATIVE

## 2021-10-24 MED ORDER — ENOXAPARIN SODIUM 100 MG/ML IJ SOSY
100.0000 mg | PREFILLED_SYRINGE | Freq: Once | INTRAMUSCULAR | Status: AC
  Administered 2021-10-24: 100 mg via SUBCUTANEOUS
  Filled 2021-10-24: qty 1

## 2021-10-24 NOTE — Progress Notes (Signed)
BLE venous duplex has been completed.  Results can be found under chart review under CV PROC. 10/24/2021 12:09 PM Adalyn Pennock RVT, RDMS

## 2021-10-24 NOTE — Discharge Instructions (Addendum)
You had a CT scan performed today that showed a nodule on your lung.  This will need to be followed up by your family doctor and you may need a repeat CT scan in the next year.

## 2021-10-24 NOTE — ED Notes (Signed)
Patient verbalizes understanding of discharge instructions. Reviewed with pt where and when to come for vascular study. Pt verbalized understanding. Opportunity for questioning and answers were provided. Armband removed by staff, pt discharged from ED ambulatory.

## 2021-11-04 ENCOUNTER — Encounter: Payer: Self-pay | Admitting: Family Medicine

## 2021-11-04 ENCOUNTER — Ambulatory Visit (INDEPENDENT_AMBULATORY_CARE_PROVIDER_SITE_OTHER): Payer: Medicare Other | Admitting: Family Medicine

## 2021-11-04 VITALS — BP 140/78 | HR 78 | Temp 98.4°F | Wt 229.4 lb

## 2021-11-04 DIAGNOSIS — Z87891 Personal history of nicotine dependence: Secondary | ICD-10-CM | POA: Diagnosis not present

## 2021-11-04 DIAGNOSIS — R7303 Prediabetes: Secondary | ICD-10-CM

## 2021-11-04 DIAGNOSIS — R911 Solitary pulmonary nodule: Secondary | ICD-10-CM | POA: Diagnosis not present

## 2021-11-04 DIAGNOSIS — R0683 Snoring: Secondary | ICD-10-CM | POA: Diagnosis not present

## 2021-11-04 DIAGNOSIS — I10 Essential (primary) hypertension: Secondary | ICD-10-CM

## 2021-11-04 DIAGNOSIS — N1832 Chronic kidney disease, stage 3b: Secondary | ICD-10-CM

## 2021-11-04 DIAGNOSIS — I700 Atherosclerosis of aorta: Secondary | ICD-10-CM | POA: Diagnosis not present

## 2021-11-04 MED ORDER — LOSARTAN POTASSIUM 100 MG PO TABS: 100.0000 mg | ORAL_TABLET | Freq: Every day | ORAL | 3 refills | Status: DC

## 2021-11-04 NOTE — Progress Notes (Signed)
Subjective:    Patient ID: Jocelyn Ford, female    DOB: 1951-12-04, 70 y.o.   MRN: [id number]  Chief Complaint  Patient presents with   Follow-up    DM and BP    HPI Patient is a 70 yo female with pmh sig for CKD III, HTN, allergies, arthritis, HLD, h/o anemia who was seen today for ED follow-up.  Patient sent to ED on 10/23/2021 for elevated D-dimer and SOB.  Limited CTA and labs in ED largely normal.  A 4 mm pulmonary nodule noted on CT. 1 year follow-up suggested.  BP elevated in the ED.  EKG without acute cardiovascular injury.  Trpn negative.  Given dose of lovenox as vascular u/s could not be performed until later that day.    Pt notes BP at home 130s over 80s.  Taking Norvasc 2.5 mg and losartan 50 mg daily.  Patient was having sharp random pains in head over the last few weeks.  Trying to work on diet.  [facility] 2/2 shortness of breath.  Patient notes improvement in SOB.  Patient states she has been told she snores at night.  States not getting good rest as up 2/2 frequent urination.  Pt is a former smoker.  Per pt smoked ~1 pk per wk x 13 yrs.  Past Medical History:  Diagnosis Date   Acute gouty arthropathy    Allergic rhinitis, cause unspecified    Arthritis    Chronic kidney disease    Dizziness and giddiness    Dysuria    Esophageal reflux    Heart murmur    Iron deficiency anemia, unspecified    Nonspecific abnormal electrocardiogram (ECG) (EKG)    Osteoporosis, unspecified    Pure hypercholesterolemia    Routine general medical examination at a health care facility    Unspecified essential hypertension     Allergies  Allergen Reactions   Doxycycline    Nifedipine     REACTION: Nausea,weakness    ROS General: Denies fever, chills, night sweats, changes in weight, changes in appetite +snoring HEENT: Denies headaches, ear pain, changes in vision, rhinorrhea, sore throat +HA CV: Denies CP, palpitations, SOB,  orthopnea Pulm: Denies SOB, cough, wheezing GI: Denies abdominal pain, nausea, vomiting, diarrhea, constipation GU: Denies dysuria, hematuria, frequency, vaginal discharge Msk: Denies muscle cramps, joint pains Neuro: Denies weakness, numbness, tingling Skin: Denies rashes, bruising Psych: Denies depression, anxiety, hallucinations    Objective:    Blood pressure 140/78, pulse 78, temperature 98.4 F (36.9 C), temperature source Oral, weight 229 lb 6.4 oz (104.1 kg), SpO2 96 %.  Gen. Pleasant, well-nourished, in no distress, normal affect   HEENT: [place]/AT, face symmetric, conjunctiva clear, no scleral icterus, PERRLA, EOMI, nares patent without drainage Neck: No JVD, no thyromegaly, no carotid bruits Lungs: no accessory muscle use, CTAB, no wheezes or rales Cardiovascular: RRR, no m/r/g, no peripheral edema Abdomen: BS present, soft, NT/ND Neuro:  A&Ox3, CN II-XII intact, normal gait Skin:  Warm, no lesions/ rash   Wt Readings from Last 3 Encounters:  11/04/21 229 lb 6.4 oz (104.1 kg)  10/21/21 229 lb 9.6 oz (104.1 kg)  07/02/20 220 lb (99.8 kg)    Lab Results  Component Value Date   WBC 5.5 10/23/2021   HGB 12.3 10/23/2021   HCT 37.7 10/23/2021   PLT 321 10/23/2021   GLUCOSE 99 10/23/2021   CHOL 205 (H) 02/13/2020   TRIG 111.0 02/13/2020   HDL 37.10 (  L) 02/13/2020   LDLDIRECT 194.6 08/21/2013   LDLCALC 146 (H) 02/13/2020   ALT 12 10/23/2021   AST 19 10/23/2021   NA 139 10/23/2021   K 3.7 10/23/2021   CL 103 10/23/2021   CREATININE 1.65 (H) 10/23/2021   BUN 16 10/23/2021   CO2 27 10/23/2021   TSH 1.43 10/21/2021   INR 1.1 (H) 01/22/2015   HGBA1C 6.1 10/21/2021    Assessment/Plan:  Essential hypertension  -Elevated -We will increase losartan from 50 mg to 100 mg daily -Continue Norvasc 2.5 mg daily -Continue lifestyle modifications -Continue checking BP at home and keeping a log to bring to clinic -We will arrange sleep study given history of  snoring -Follow-up in 4-6 weeks - Plan: Ambulatory referral to Sleep Studies, losartan (COZAAR) 100 MG tablet  Aortic atherosclerosis ([place]) -Noted on CTA -Lifestyle modifications -Recheck lipid panel.  For elevation in cholesterol start medication  Snoring -May be contributing to HTN. - Plan: Ambulatory referral to Sleep Studies  Prediabetes -Hemoglobin A1c 6.1% 10/21/2021 -Lifestyle modification strongly encouraged -Given handout -Continue to monitor  Incidental pulmonary nodule, > 67mm and < 35mm -Noted on CTA chest from 11/4/202: 4 mm nodule in RUL noncalcified lung nodule versus focal atelectasis. -Given prior smoking history we will repeat CT chest without contrast in 12 months.  Former smoker -Continue smoking cessation encouraged  CKD 3b -GFR 34 on 10/23/21 -renally dose medications and avoid nephrotoxic meds. -recheck BMP in the next few wks  F/u in 4-8 wks  Grier Mitts, MD

## 2021-11-04 NOTE — Patient Instructions (Addendum)
At today's visit we discussed increasing your dose of losartan from 50 mg to 100 mg daily.  New prescription for the increased dose was sent to your pharmacy.  You can start taking 2 of your 50 mg tablets that you have left at home until you pick up the new prescription.  Continue taking Norvasc (amlodipine) 2.5 mg daily.  Continue checking her blood pressure at home and keeping a log to bring with you to clinic.  Referrals placed for you to have a sleep study.  You should expect a call about scheduling this appointment.  Decrease the amount of sweets and carbohydrates you are eating to help improve your hemoglobin A1c.  Your hemoglobin A1c was 6.1% on 10/21/2021 which places you in the prediabetic range.  6.5% and greater is diabetes.  Atherosclerosis (plaque in your arteries) was noted on the imaging you had done in the emergency department.  This is common as we get older.  Working on decreasing the amount of saturated fats and lowering your cholesterol will help with this.  We will have you follow-up in clinic in the next 4 to 6 weeks or sooner to recheck blood pressure.

## 2021-11-09 ENCOUNTER — Other Ambulatory Visit: Payer: Self-pay | Admitting: Family Medicine

## 2021-11-23 ENCOUNTER — Other Ambulatory Visit: Payer: Self-pay | Admitting: Family Medicine

## 2021-11-23 DIAGNOSIS — Z1231 Encounter for screening mammogram for malignant neoplasm of breast: Secondary | ICD-10-CM

## 2021-12-03 ENCOUNTER — Institutional Professional Consult (permissible substitution): Payer: Medicare Other | Admitting: Pulmonary Disease

## 2021-12-14 ENCOUNTER — Other Ambulatory Visit: Payer: Self-pay | Admitting: Family Medicine

## 2021-12-15 ENCOUNTER — Telehealth: Payer: Self-pay | Admitting: Family Medicine

## 2021-12-15 NOTE — Telephone Encounter (Signed)
Patient called because pharmacy is needing a prescription for atenolol (TENORMIN) 25 MG tablet. Pharmacy states patient is taking it differently. I did let patient know that Dr.Banks was out of the office    Please send to  [id number] - [place], [place]. Phone:  [phone]  Fax:  [phone]         Please advise

## 2021-12-15 NOTE — Telephone Encounter (Signed)
Refill was sent to pharmacy.

## 2021-12-23 ENCOUNTER — Ambulatory Visit (INDEPENDENT_AMBULATORY_CARE_PROVIDER_SITE_OTHER): Payer: Medicare Other | Admitting: Family Medicine

## 2021-12-23 ENCOUNTER — Encounter: Payer: Self-pay | Admitting: Family Medicine

## 2021-12-23 VITALS — BP 132/88 | HR 88 | Temp 98.3°F | Wt 223.0 lb

## 2021-12-23 DIAGNOSIS — I10 Essential (primary) hypertension: Secondary | ICD-10-CM

## 2021-12-23 DIAGNOSIS — N1832 Chronic kidney disease, stage 3b: Secondary | ICD-10-CM | POA: Diagnosis not present

## 2021-12-23 DIAGNOSIS — J302 Other seasonal allergic rhinitis: Secondary | ICD-10-CM

## 2021-12-23 MED ORDER — FLUTICASONE PROPIONATE 50 MCG/ACT NA SUSP: 1.0000 | Freq: Every day | NASAL | 5 refills | Status: DC | PRN

## 2021-12-23 NOTE — Progress Notes (Signed)
Subjective:    Patient ID: Jocelyn Ford, female    DOB: 1951/04/14, 71 y.o.   MRN: [id number]  Chief Complaint  Patient presents with   Follow-up    BP    HPI Patient was seen today for follow-up on BP.  Losartan increased from 50 mg to 100 mg daily.  Also taking Norvasc 2.5 mg, atenolol 25 mg twice daily, and Lasix 40 mg daily.  BP at home 95-143/53-70.  Pt denies HA, CP, changes in vision.  Had cataract surgery, on steroid eyedrops.  Plans to have cataract surgery on the other eye this month.  Followed by Dr. Katy Fitch.  Followed by nephrology for CKD, has upcoming appointment.  Request refill on Flonase.  Past Medical History:  Diagnosis Date   Acute gouty arthropathy    Allergic rhinitis, cause unspecified    Arthritis    Chronic kidney disease    Dizziness and giddiness    Dysuria    Esophageal reflux    Heart murmur    Iron deficiency anemia, unspecified    Nonspecific abnormal electrocardiogram (ECG) (EKG)    Osteoporosis, unspecified    Pure hypercholesterolemia    Routine general medical examination at a health care facility    Unspecified essential hypertension     Allergies  Allergen Reactions   Doxycycline    Nifedipine     REACTION: Nausea,weakness    ROS General: Denies fever, chills, night sweats, changes in weight, changes in appetite HEENT: Denies headaches, ear pain, changes in vision, rhinorrhea, sore throat CV: Denies CP, palpitations, SOB, orthopnea Pulm: Denies SOB, cough, wheezing GI: Denies abdominal pain, nausea, vomiting, diarrhea, constipation GU: Denies dysuria, hematuria, frequency, vaginal discharge Msk: Denies muscle cramps, joint pains Neuro: Denies weakness, numbness, tingling Skin: Denies rashes, bruising Psych: Denies depression, anxiety, hallucinations     Objective:    Blood pressure 132/88, pulse 88, temperature 98.3 F (36.8 C), temperature source Oral, weight 223 lb (101.2 kg), SpO2 96 %.  Gen. Pleasant, well-nourished,  in no distress, normal affect   HEENT: [place]/AT, face symmetric, conjunctiva clear, no scleral icterus, PERRLA, EOMI, nares patent without drainage Lungs: no accessory muscle use Cardiovascular: RRR, no peripheral edema Musculoskeletal: No deformities, no cyanosis or clubbing, normal tone Neuro:  A&Ox3, CN II-XII intact, normal gait Skin:  Warm, no lesions/ rash   Wt Readings from Last 3 Encounters:  12/23/21 223 lb (101.2 kg)  11/04/21 229 lb 6.4 oz (104.1 kg)  10/21/21 229 lb 9.6 oz (104.1 kg)    Lab Results  Component Value Date   WBC 5.5 10/23/2021   HGB 12.3 10/23/2021   HCT 37.7 10/23/2021   PLT 321 10/23/2021   GLUCOSE 99 10/23/2021   CHOL 205 (H) 02/13/2020   TRIG 111.0 02/13/2020   HDL 37.10 (L) 02/13/2020   LDLDIRECT 194.6 08/21/2013   LDLCALC 146 (H) 02/13/2020   ALT 12 10/23/2021   AST 19 10/23/2021   NA 139 10/23/2021   K 3.7 10/23/2021   CL 103 10/23/2021   CREATININE 1.65 (H) 10/23/2021   BUN 16 10/23/2021   CO2 27 10/23/2021   TSH 1.43 10/21/2021   INR 1.1 (H) 01/22/2015   HGBA1C 6.1 10/21/2021    Assessment/Plan:  Essential hypertension -Improving -Well-controlled at home. -We will discontinue Norvasc 2.5 mg daily given systolic readings in the 40J -Continue atenolol 25 mg twice daily, Lasix 40 mg daily, losartan 100 mg daily. -Continue checking BP daily. -Discussed labs to recheck renal function as may benefit from  decreasing Lasix dose for taking as needed.  Seasonal allergies - Plan: fluticasone (FLONASE) 50 MCG/ACT nasal spray  Stage 3b chronic kidney disease (HCC) -Stable -Avoid nephrotoxic medications and renally dose medications. -Continue follow-up with nephrology  F/u in 6-8 weeks, sooner if needed  Grier Mitts, MD

## 2021-12-23 NOTE — Patient Instructions (Addendum)
Stop taking Norvasc 2.5 mg daily.  Continue taking your other medications including losartan 100 mg daily, atenolol 25 mg twice a day, Lasix 40 mg daily.  Continue checking your blood pressure at home and keeping a log to bring with you to clinic.

## 2021-12-25 ENCOUNTER — Ambulatory Visit (INDEPENDENT_AMBULATORY_CARE_PROVIDER_SITE_OTHER): Payer: Medicare Other | Admitting: Pulmonary Disease

## 2021-12-25 ENCOUNTER — Other Ambulatory Visit: Payer: Self-pay

## 2021-12-25 ENCOUNTER — Encounter: Payer: Self-pay | Admitting: Pulmonary Disease

## 2021-12-25 VITALS — BP 140/90 | HR 78 | Temp 98.0°F | Ht 67.0 in | Wt 228.2 lb

## 2021-12-25 DIAGNOSIS — R911 Solitary pulmonary nodule: Secondary | ICD-10-CM

## 2021-12-25 DIAGNOSIS — R0683 Snoring: Secondary | ICD-10-CM

## 2021-12-25 NOTE — Progress Notes (Signed)
Subjective:    Patient ID: Jocelyn Ford, female    DOB: 18-Nov-1951, 71 y.o.   MRN: [id number]  HPI  Chief Complaint  Patient presents with   Consult    Patient states that she snores and has high BP. Wakes up several times a night due to kidney disease. States that it usually takes her about 30 minutes to go to sleep and has some issues going back to sleep.     71 year old ex-smoker presents for evaluation of sleep disordered breathing. She has hypertension dating back to her 53s post pregnancy, now requiring 2 medications.  Loud snoring has been noted by family members over the years.  She reports nocturia nonrefreshing sleep. Epworth sleepiness score is 1 and she denies daytime naps but does admit to fatigue.  She denies excessive use of caffeinated beverages. Bedtime is between 9 and 11 PM, sleep latency can be up to an hour, there is a TV in the bedroom and is on a timer, she sleeps on her right side with 2 pillows, reports 1-2 nocturnal awakenings including nocturia and is out of bed between 9 and 10 AM feeling tired with occasional headaches and dryness of mouth.  Headaches have decreased since her losartan dose was increased. She has gained 10 pounds over the last 2 years  There is no history suggestive of cataplexy, sleep paralysis or parasomnias  She had an ED visit 10/2021 and underwent CT angiogram which picked up a 4 mm nodule in the right upper lobe that she is concerned about.  We reviewed images and PCP notes She smoked a pack per day starting in her 59s until she quit in 2000, more than 25 pack years  She is retired, divorced and lives alone, no bed partner history is available  PMH -hypertension on 2 meds, CKD stage III, GFR 34  Significant tests/ events reviewed  07/2016 spirometry normal 10/2021 CT angiogram chest for millimeter right upper lobe nodule     Past Medical History:  Diagnosis Date   Acute gouty arthropathy    Allergic rhinitis, cause  unspecified    Arthritis    Chronic kidney disease    Dizziness and giddiness    Dysuria    Esophageal reflux    Heart murmur    Iron deficiency anemia, unspecified    Nonspecific abnormal electrocardiogram (ECG) (EKG)    Osteoporosis, unspecified    Pure hypercholesterolemia    Routine general medical examination at a health care facility    Unspecified essential hypertension    Past Surgical History:  Procedure Laterality Date   ABDOMINAL HYSTERECTOMY     LEFT HEART CATHETERIZATION WITH CORONARY ANGIOGRAM N/A 01/28/2015   Procedure: LEFT HEART CATHETERIZATION WITH CORONARY ANGIOGRAM;  Surgeon: Candee Furbish, MD;  Location: [facility] CATH LAB;  Service: Cardiovascular;  Laterality: N/A;    Allergies  Allergen Reactions   Doxycycline    Nifedipine     REACTION: Nausea,weakness    .soc   Family History  Problem Relation Age of Onset   Cancer Father        Prostate   Hypertension Father    Alzheimer's disease Mother    Hypertension Mother    Kidney disease Mother    Diabetes Sister    Hypertension Sister    Heart Problems Brother        stent   Hypertension Brother    Healthy Daughter    Hypertension Sister    Breast cancer Sister  Review of Systems Acid heartburn Headaches Nasal congestion and sneezing  Constitutional: negative for anorexia, fevers and sweats  Eyes: negative for irritation, redness and visual disturbance  Ears, nose, mouth, throat, and face: negative for earaches, epistaxis, and sore throat  Respiratory: negative for cough, dyspnea on exertion, sputum and wheezing  Cardiovascular: negative for chest pain, dyspnea, lower extremity edema, orthopnea, palpitations and syncope  Gastrointestinal: negative for abdominal pain, constipation, diarrhea, melena, nausea and vomiting  Genitourinary:negative for dysuria, frequency and hematuria  Hematologic/lymphatic: negative for bleeding, easy bruising and lymphadenopathy  Musculoskeletal:negative for  arthralgias, muscle weakness and stiff joints  Neurological: negative for coordination problems, gait problems, headaches and weakness  Endocrine: negative for diabetic symptoms including polydipsia, polyuria and weight loss     Objective:   Physical Exam  Gen. Pleasant, obese, in no distress, normal affect ENT - no pallor,icterus, no post nasal drip, class 2-3 airway Neck: No JVD, no thyromegaly, no carotid bruits Lungs: no use of accessory muscles, no dullness to percussion, decreased without rales or rhonchi  Cardiovascular: Rhythm regular, heart sounds  normal, no murmurs or gallops, no peripheral edema Abdomen: soft and non-tender, no hepatosplenomegaly, BS normal. Musculoskeletal: No deformities, no cyanosis or clubbing Neuro:  alert, non focal, no tremors       Assessment & Plan:    Sleep disordered breathing - Given excessive daytime somnolence, narrow pharyngeal exam, witnessed apneas & loud snoring, obstructive sleep apnea is possible & an overnight polysomnogram will be scheduled as a home study. The pathophysiology of obstructive sleep apnea , it's cardiovascular consequences & modes of treatment including CPAP were discused with the patient in detail & they evidenced understanding.  Pretest probablity is intermediate.  We will only treat if AHI is more than 15 or significant desaturation due to her otherwise favorable cardiovascular profile

## 2021-12-25 NOTE — Assessment & Plan Note (Signed)
Incidentally noted on CT angiogram chest.  Likely benign but given her 25-pack-year history of smoking, will schedule I year follow-up CT scan in November 2023

## 2021-12-25 NOTE — Patient Instructions (Signed)
°  X home sleep test  X Ct chest Wo contrast in nov 2023

## 2021-12-29 ENCOUNTER — Ambulatory Visit
Admission: RE | Admit: 2021-12-29 | Discharge: 2021-12-29 | Disposition: A | Discharge status: Home / Self Care | Payer: Medicare Other | Source: Ambulatory Visit | Attending: Family Medicine | Admitting: Family Medicine

## 2021-12-29 DIAGNOSIS — Z1231 Encounter for screening mammogram for malignant neoplasm of breast: Secondary | ICD-10-CM

## 2021-12-30 DIAGNOSIS — H2512 Age-related nuclear cataract, left eye: Secondary | ICD-10-CM | POA: Diagnosis not present

## 2022-01-01 DIAGNOSIS — H25812 Combined forms of age-related cataract, left eye: Secondary | ICD-10-CM | POA: Diagnosis not present

## 2022-01-06 ENCOUNTER — Other Ambulatory Visit: Payer: Self-pay | Admitting: Family Medicine

## 2022-01-07 DIAGNOSIS — N189 Chronic kidney disease, unspecified: Secondary | ICD-10-CM | POA: Diagnosis not present

## 2022-01-07 DIAGNOSIS — E213 Hyperparathyroidism, unspecified: Secondary | ICD-10-CM | POA: Diagnosis not present

## 2022-01-07 DIAGNOSIS — D631 Anemia in chronic kidney disease: Secondary | ICD-10-CM | POA: Diagnosis not present

## 2022-01-07 DIAGNOSIS — N183 Chronic kidney disease, stage 3 unspecified: Secondary | ICD-10-CM | POA: Diagnosis not present

## 2022-01-07 DIAGNOSIS — I129 Hypertensive chronic kidney disease with stage 1 through stage 4 chronic kidney disease, or unspecified chronic kidney disease: Secondary | ICD-10-CM | POA: Diagnosis not present

## 2022-01-07 DIAGNOSIS — M109 Gout, unspecified: Secondary | ICD-10-CM | POA: Diagnosis not present

## 2022-01-26 ENCOUNTER — Other Ambulatory Visit: Payer: Self-pay | Admitting: Family Medicine

## 2022-01-26 DIAGNOSIS — I10 Essential (primary) hypertension: Secondary | ICD-10-CM

## 2022-01-30 ENCOUNTER — Other Ambulatory Visit: Payer: Self-pay | Admitting: Family Medicine

## 2022-01-30 DIAGNOSIS — Z760 Encounter for issue of repeat prescription: Secondary | ICD-10-CM

## 2022-02-03 ENCOUNTER — Other Ambulatory Visit: Payer: Self-pay

## 2022-02-03 ENCOUNTER — Ambulatory Visit: Payer: Medicare Other

## 2022-02-03 DIAGNOSIS — G4733 Obstructive sleep apnea (adult) (pediatric): Secondary | ICD-10-CM

## 2022-02-03 DIAGNOSIS — R0683 Snoring: Secondary | ICD-10-CM

## 2022-02-04 ENCOUNTER — Telehealth: Payer: Self-pay | Admitting: Pulmonary Disease

## 2022-02-04 DIAGNOSIS — G4733 Obstructive sleep apnea (adult) (pediatric): Secondary | ICD-10-CM

## 2022-02-04 NOTE — Telephone Encounter (Signed)
HST showed mod  OSA with AHI 17/ hr  Options include CPAP or dental appliance  Suggest autoCPAP  5-15 cm, mask of choice OV with me/APP in 6 wks after starting

## 2022-02-05 NOTE — Telephone Encounter (Signed)
Called and spoke with patient to let her know HST results. She expressed understanding and states that she would like to think about treatment options and call us back. Advised her to call us once she has decided. Will wait to hear from patient

## 2022-02-10 ENCOUNTER — Ambulatory Visit (INDEPENDENT_AMBULATORY_CARE_PROVIDER_SITE_OTHER): Payer: Medicare Other | Admitting: Family Medicine

## 2022-02-10 ENCOUNTER — Encounter: Payer: Self-pay | Admitting: Family Medicine

## 2022-02-10 VITALS — BP 171/88 | HR 76 | Temp 98.1°F | Wt 227.2 lb

## 2022-02-10 DIAGNOSIS — I10 Essential (primary) hypertension: Secondary | ICD-10-CM | POA: Diagnosis not present

## 2022-02-10 DIAGNOSIS — N1832 Chronic kidney disease, stage 3b: Secondary | ICD-10-CM | POA: Diagnosis not present

## 2022-02-10 NOTE — Telephone Encounter (Signed)
Called and spoke with patient who states that she would like to be set up with CPAP. Order has been placed. I told her that she would get a call from a DME company once they get our order and we will go from there. Told her that once she gets the machine to call the office and set up a follow up visit 31-90 days after getting machine. She expressed understanding. Nothing further needed at this time.

## 2022-02-10 NOTE — Progress Notes (Signed)
Subjective:    Patient ID: Jocelyn Ford, female    DOB: 05-Feb-1951, 71 y.o.   MRN: [id number]  Chief Complaint  Patient presents with   Follow-up    BP and med changes    HPI Patient was seen today for f/u on HTN.  Pt states bp has been controlled at home on Losartan 100 mg, lasix 40 mg, and atenolol 25 mg BID.  Readings 117-120s/60s per memory.  Pt states the highest reading was 134/65 last night.  Pt notes drinking 64 oz of water per day.  Had increased bloating in the last wk, took extra lasix.  Eating canned soup daily for the last 2 wks.  Denies HAs, CP, changes in vision.  Past Medical History:  Diagnosis Date   Acute gouty arthropathy    Allergic rhinitis, cause unspecified    Arthritis    Chronic kidney disease    Dizziness and giddiness    Dysuria    Esophageal reflux    Heart murmur    Iron deficiency anemia, unspecified    Nonspecific abnormal electrocardiogram (ECG) (EKG)    Osteoporosis, unspecified    Pure hypercholesterolemia    Routine general medical examination at a health care facility    Unspecified essential hypertension     Allergies  Allergen Reactions   Doxycycline    Nifedipine     REACTION: Nausea,weakness    ROS General: Denies fever, chills, night sweats, changes in weight, changes in appetite HEENT: Denies headaches, ear pain, changes in vision, rhinorrhea, sore throat CV: Denies CP, palpitations, SOB, orthopnea Pulm: Denies SOB, cough, wheezing GI: Denies abdominal pain, nausea, vomiting, diarrhea, constipation +bloating GU: Denies dysuria, hematuria, frequency, vaginal discharge Msk: Denies muscle cramps, joint pains Neuro: Denies weakness, numbness, tingling Skin: Denies rashes, bruising Psych: Denies depression, anxiety, hallucinations    Objective:    Blood pressure (!) 171/88, pulse 76, temperature 98.1 F (36.7 C), temperature source Oral, weight 227 lb 3.2 oz (103.1 kg), SpO2 99 %.  Initial BP 140/101, sitting, left  arm Orthostatics obtained.  Gen. Pleasant, well-nourished, in no distress, normal affect   HEENT: [place]/AT, face symmetric, conjunctiva clear, no scleral icterus, PERRLA, EOMI, nares patent without drainage Lungs: no accessory muscle use, CTAB, no wheezes or rales Cardiovascular: RRR, no m/r/g, no peripheral edema Musculoskeletal: No deformities, no cyanosis or clubbing, normal tone Neuro:  A&Ox3, CN II-XII intact, normal gait Skin:  Warm, no lesions/ rash   Wt Readings from Last 3 Encounters:  02/10/22 227 lb 3.2 oz (103.1 kg)  12/25/21 228 lb 3.2 oz (103.5 kg)  12/23/21 223 lb (101.2 kg)    Lab Results  Component Value Date   WBC 5.5 10/23/2021   HGB 12.3 10/23/2021   HCT 37.7 10/23/2021   PLT 321 10/23/2021   GLUCOSE 99 10/23/2021   CHOL 205 (H) 02/13/2020   TRIG 111.0 02/13/2020   HDL 37.10 (L) 02/13/2020   LDLDIRECT 194.6 08/21/2013   LDLCALC 146 (H) 02/13/2020   ALT 12 10/23/2021   AST 19 10/23/2021   NA 139 10/23/2021   K 3.7 10/23/2021   CL 103 10/23/2021   CREATININE 1.65 (H) 10/23/2021   BUN 16 10/23/2021   CO2 27 10/23/2021   TSH 1.43 10/21/2021   INR 1.1 (H) 01/22/2015   HGBA1C 6.1 10/21/2021    Assessment/Plan:  Essential hypertension -Hypertensive urgency -BP previously controlled. -Elevation likely 2/2 increased sodium intake which was also likely causing bloating/water retention.  Advised on low-sodium diet 2000 mg of  salt per day and to read labels on foods -Continue current medications including atenolol 25 mg twice daily, Lasix 40 mg daily, losartan 100 mg daily -Continue monitoring BP at home and bring a log and monitor to next OFV -Patient advised to contact clinic if BP consistently greater than 140/90.  Stage 3b chronic kidney disease ([place]) -Creatinine 1.640 01/07/2022.  Per chart review creatinine 1.65 and EGFR 34 on 10/23/2021. -Continue follow-up with nephrology -Avoid nephrotoxic medications and renally dose meds  F/u in 2-4  weeks  Grier Mitts, MD

## 2022-02-12 ENCOUNTER — Telehealth: Payer: Self-pay | Admitting: Family Medicine

## 2022-02-12 NOTE — Telephone Encounter (Signed)
Pt call and stated she is going back on amLODipine (NORVASC) 2.5 MG tablet  .

## 2022-02-24 DIAGNOSIS — Z03818 Encounter for observation for suspected exposure to other biological agents ruled out: Secondary | ICD-10-CM | POA: Diagnosis not present

## 2022-02-24 DIAGNOSIS — Z20822 Contact with and (suspected) exposure to covid-19: Secondary | ICD-10-CM | POA: Diagnosis not present

## 2022-02-25 NOTE — Telephone Encounter (Signed)
Patient having continued elevation in BP can be seen sooner than appointment on 03/10/2022. ?

## 2022-02-26 DIAGNOSIS — G4733 Obstructive sleep apnea (adult) (pediatric): Secondary | ICD-10-CM | POA: Diagnosis not present

## 2022-03-04 NOTE — Telephone Encounter (Signed)
Spoke with pt, stated she was feeling bloated, as the reason for taking the amlodipine. Stated her BP is okay and will keeo the appointment for 03/10/22 ?

## 2022-03-10 ENCOUNTER — Ambulatory Visit (INDEPENDENT_AMBULATORY_CARE_PROVIDER_SITE_OTHER): Payer: Medicare Other | Admitting: Family Medicine

## 2022-03-10 ENCOUNTER — Encounter: Payer: Self-pay | Admitting: Family Medicine

## 2022-03-10 VITALS — BP 157/89 | HR 80 | Temp 98.4°F | Wt 227.2 lb

## 2022-03-10 DIAGNOSIS — Z9989 Dependence on other enabling machines and devices: Secondary | ICD-10-CM

## 2022-03-10 DIAGNOSIS — G4733 Obstructive sleep apnea (adult) (pediatric): Secondary | ICD-10-CM | POA: Diagnosis not present

## 2022-03-10 DIAGNOSIS — I10 Essential (primary) hypertension: Secondary | ICD-10-CM | POA: Diagnosis not present

## 2022-03-10 DIAGNOSIS — N1832 Chronic kidney disease, stage 3b: Secondary | ICD-10-CM

## 2022-03-10 NOTE — Progress Notes (Signed)
Subjective:  ? ? Patient ID: Jocelyn Ford, female    DOB: Jul 31, 1951, 71 y.o.   MRN: [id number] ? ?Chief Complaint  ?Patient presents with  ? Follow-up  ?  BP and BP meds  ? ? ?HPI ?Patient was seen today for f/u on HTN.  Pt taking atenolol 25 mg BID, losartan 100 mg daily, lasix 20 mg daily.  Pt restarted norvasc 2.5 mg a few days after last OFV as her bp was still elevated.  Pt wearing CPAP for OSA x 2 wks.  Getting used to wearing the mask.  States has more energy during the day since wearing the CPAP.  BP at home 1 teens-130s/60s-70s.  Pt denies HAs, CP, changes in vision.  Has 6 mo f/u with nephrology in June/July for CKD ? ?Past Medical History:  ?Diagnosis Date  ? Acute gouty arthropathy   ? Allergic rhinitis, cause unspecified   ? Arthritis   ? Chronic kidney disease   ? Dizziness and giddiness   ? Dysuria   ? Esophageal reflux   ? Heart murmur   ? Iron deficiency anemia, unspecified   ? Nonspecific abnormal electrocardiogram (ECG) (EKG)   ? Osteoporosis, unspecified   ? Pure hypercholesterolemia   ? Routine general medical examination at a health care facility   ? Unspecified essential hypertension   ? ? ?Allergies  ?Allergen Reactions  ? Doxycycline   ? Nifedipine   ?  REACTION: Nausea,weakness  ? ? ?ROS ?General: Denies fever, chills, night sweats, changes in weight, changes in appetite ?HEENT: Denies headaches, ear pain, changes in vision, rhinorrhea, sore throat ?CV: Denies CP, palpitations, SOB, orthopnea ?Pulm: Denies SOB, cough, wheezing ?GI: Denies abdominal pain, nausea, vomiting, diarrhea, constipation ?GU: Denies dysuria, hematuria, frequency, vaginal discharge ?Msk: Denies muscle cramps, joint pains ?Neuro: Denies weakness, numbness, tingling ?Skin: Denies rashes, bruising ?Psych: Denies depression, anxiety, hallucinations ? ?   ?Objective:  ?  ?Blood pressure (!) 158/88, pulse 80, temperature 98.4 ?F (36.9 ?C), temperature source Oral, weight 227 lb 3.2 oz (103.1 kg), SpO2 100 %. BP  recheck 157/89 R arm, sitting. ? ?Gen. Pleasant, well-nourished, in no distress, normal affect   ?HEENT: [place]/AT, face symmetric, conjunctiva clear, no scleral icterus, PERRLA, EOMI, nares patent without drainage ?Lungs: no accessory muscle use, CTAB, no wheezes or rales ?Cardiovascular: RRR, no m/r/g, no peripheral edema ?Musculoskeletal: No deformities, no cyanosis or clubbing, normal tone ?Neuro:  A&Ox3, CN II-XII intact, normal gait ?Skin:  Warm, no lesions/ rash ? ? ?Wt Readings from Last 3 Encounters:  ?03/10/22 227 lb 3.2 oz (103.1 kg)  ?02/10/22 227 lb 3.2 oz (103.1 kg)  ?12/25/21 228 lb 3.2 oz (103.5 kg)  ? ? ?Lab Results  ?Component Value Date  ? WBC 5.5 10/23/2021  ? HGB 12.3 10/23/2021  ? HCT 37.7 10/23/2021  ? PLT 321 10/23/2021  ? GLUCOSE 99 10/23/2021  ? CHOL 205 (H) 02/13/2020  ? TRIG 111.0 02/13/2020  ? HDL 37.10 (L) 02/13/2020  ? LDLDIRECT 194.6 08/21/2013  ? LDLCALC 146 (H) 02/13/2020  ? ALT 12 10/23/2021  ? AST 19 10/23/2021  ? NA 139 10/23/2021  ? K 3.7 10/23/2021  ? CL 103 10/23/2021  ? CREATININE 1.65 (H) 10/23/2021  ? BUN 16 10/23/2021  ? CO2 27 10/23/2021  ? TSH 1.43 10/21/2021  ? INR 1.1 (H) 01/22/2015  ? HGBA1C 6.1 10/21/2021  ? ? ?Assessment/Plan: ? ?Essential hypertension ?-uncontrolled in clinic, but normal at home ?-continue current meds including losartan 100 mg daily,  Lasix 40 mg daily, atenolol 25 mg twice daily ?- Pt to stop norvasc 2.5 mg daily as discussed at last OFV.  Restart if bp consistently >140/90. ?-Continue checking BP at home ?-Continue lifestyle modifications ? ?Stage 3b chronic kidney disease ([place]) ?-Creatinine 1.64 on 01/07/2022 ?-Discussed the importance of BP control ?-Discussed the importance of lifestyle modifications including decreasing sodium intake ?-We will recheck creatinine in the next OFV ? ?OSA on CPAP ?-Continue CPAP use nightly ?-Follow-up with pulm/sleep medicine for adjustments ? ?F/u in 3-4 months, sooner if needed ? ?Grier Mitts, MD ?

## 2022-03-11 ENCOUNTER — Ambulatory Visit: Payer: Medicare Other | Admitting: Pulmonary Disease

## 2022-03-29 DIAGNOSIS — G4733 Obstructive sleep apnea (adult) (pediatric): Secondary | ICD-10-CM | POA: Diagnosis not present

## 2022-04-07 ENCOUNTER — Other Ambulatory Visit: Payer: Self-pay | Admitting: Family Medicine

## 2022-04-13 ENCOUNTER — Ambulatory Visit: Payer: Medicare Other | Admitting: Pulmonary Disease

## 2022-04-13 ENCOUNTER — Encounter: Payer: Self-pay | Admitting: Pulmonary Disease

## 2022-04-13 VITALS — BP 148/76 | HR 70 | Temp 98.4°F | Ht 67.0 in | Wt 230.6 lb

## 2022-04-13 DIAGNOSIS — R911 Solitary pulmonary nodule: Secondary | ICD-10-CM

## 2022-04-13 DIAGNOSIS — G4733 Obstructive sleep apnea (adult) (pediatric): Secondary | ICD-10-CM | POA: Diagnosis not present

## 2022-04-13 NOTE — Assessment & Plan Note (Signed)
Moderate OSA, we reviewed home sleep study in detail. ?CPAP download was reviewed which shows excellent control of events with average pressure of 9 cm and maximum pressure of 10.5 cm. ?We will change auto CPAP settings to 5 to 12 cm. ?Compliance is good for the initial month.  She is having feeling of bloating ?EPR setting will be kept at 3.  If this persists we can consider dropping the pressure further or even BiPAP therapy. ?Weight loss encouraged, compliance with goal of at least 4-6 hrs every night is the expectation. ?Advised against medications with sedative side effects ?Cautioned against driving when sleepy - understanding that sleepiness will vary on a day to day basis ? ?

## 2022-04-13 NOTE — Patient Instructions (Signed)
? ?  CPAP is working well ? ?X Change to auto CPAP 5-12 cm ? ?Try to use at least 6h every night ?

## 2022-04-13 NOTE — Progress Notes (Signed)
? ?  Subjective:  ? ? Patient ID: Jocelyn Ford, female    DOB: 08-09-1951, 71 y.o.   MRN: [id number] ? ?HPI ? ?71 year old woman for follow-up of OSA and lung nodule ?After her initial visit, we pursued a home sleep test that showed moderate OSA.  She was set up on CPAP , she started on nasal mask but this caused her bloating and feeling gassy.  Her headaches have subsided but she does not report any increase in her energy. ?She continues to have nocturia due to diuretics ?She has just requested a full facemask from DME. ? ?CPAP pressure is okay and she denies dryness in the mornings ? ?PMH -hypertension on 2 meds, CKD stage III, GFR 34 ?  ?Significant tests/ events reviewed ?  ?01/2022 HST showed mod  OSA with AHI 17/ hr, lowest desaturation 70%, TST 288 minutes ? ?07/2016 spirometry normal ?10/2021 CT angiogram chest 4 mm right upper lobe nodule ? ? ?Review of Systems ?neg for any significant sore throat, dysphagia, itching, sneezing, nasal congestion or excess/ purulent secretions, fever, chills, sweats, unintended wt loss, pleuritic or exertional cp, hempoptysis, orthopnea pnd or change in chronic leg swelling. Also denies presyncope, palpitations, heartburn, abdominal pain, nausea, vomiting, diarrhea or change in bowel or urinary habits, dysuria,hematuria, rash, arthralgias, visual complaints, headache, numbness weakness or ataxia. ? ?   ?Objective:  ? Physical Exam ? ?Gen. Pleasant, obese, in no distress ?ENT - no lesions, no post nasal drip ?Neck: No JVD, no thyromegaly, no carotid bruits ?Lungs: no use of accessory muscles, no dullness to percussion, decreased without rales or rhonchi  ?Cardiovascular: Rhythm regular, heart sounds  normal, no murmurs or gallops, no peripheral edema ?Musculoskeletal: No deformities, no cyanosis or clubbing , no tremors ? ? ? ? ?   ?Assessment & Plan:  ? ? ?

## 2022-04-13 NOTE — Assessment & Plan Note (Addendum)
1 year follow-up CT scan can be arranged for November 2023 for this low-risk nodule ?

## 2022-04-14 DIAGNOSIS — G4733 Obstructive sleep apnea (adult) (pediatric): Secondary | ICD-10-CM | POA: Diagnosis not present

## 2022-04-28 DIAGNOSIS — G4733 Obstructive sleep apnea (adult) (pediatric): Secondary | ICD-10-CM | POA: Diagnosis not present

## 2022-05-07 DIAGNOSIS — G4733 Obstructive sleep apnea (adult) (pediatric): Secondary | ICD-10-CM | POA: Diagnosis not present

## 2022-05-10 ENCOUNTER — Other Ambulatory Visit: Payer: Self-pay | Admitting: Family Medicine

## 2022-05-29 DIAGNOSIS — G4733 Obstructive sleep apnea (adult) (pediatric): Secondary | ICD-10-CM | POA: Diagnosis not present

## 2022-05-31 ENCOUNTER — Other Ambulatory Visit: Payer: Self-pay | Admitting: Family Medicine

## 2022-05-31 DIAGNOSIS — Z760 Encounter for issue of repeat prescription: Secondary | ICD-10-CM

## 2022-06-09 DIAGNOSIS — G4733 Obstructive sleep apnea (adult) (pediatric): Secondary | ICD-10-CM | POA: Diagnosis not present

## 2022-06-10 ENCOUNTER — Ambulatory Visit: Payer: Medicare Other | Admitting: Family Medicine

## 2022-06-23 ENCOUNTER — Encounter: Payer: Self-pay | Admitting: Family Medicine

## 2022-06-23 ENCOUNTER — Ambulatory Visit (INDEPENDENT_AMBULATORY_CARE_PROVIDER_SITE_OTHER): Payer: Medicare Other | Admitting: Family Medicine

## 2022-06-23 VITALS — BP 132/78 | HR 88 | Temp 98.2°F | Wt 224.2 lb

## 2022-06-23 DIAGNOSIS — Z9989 Dependence on other enabling machines and devices: Secondary | ICD-10-CM | POA: Diagnosis not present

## 2022-06-23 DIAGNOSIS — R7303 Prediabetes: Secondary | ICD-10-CM

## 2022-06-23 DIAGNOSIS — M7631 Iliotibial band syndrome, right leg: Secondary | ICD-10-CM | POA: Diagnosis not present

## 2022-06-23 DIAGNOSIS — I10 Essential (primary) hypertension: Secondary | ICD-10-CM

## 2022-06-23 DIAGNOSIS — N1832 Chronic kidney disease, stage 3b: Secondary | ICD-10-CM | POA: Diagnosis not present

## 2022-06-23 DIAGNOSIS — K5900 Constipation, unspecified: Secondary | ICD-10-CM | POA: Diagnosis not present

## 2022-06-23 DIAGNOSIS — G4733 Obstructive sleep apnea (adult) (pediatric): Secondary | ICD-10-CM

## 2022-06-23 LAB — BASIC METABOLIC PANEL
BUN: 17 mg/dL (ref 6–23)
CO2: 27 mEq/L (ref 19–32)
Calcium: 9.9 mg/dL (ref 8.4–10.5)
Chloride: 104 mEq/L (ref 96–112)
Creatinine, Ser: 1.47 mg/dL — ABNORMAL HIGH (ref 0.40–1.20)
GFR: 36.23 mL/min — ABNORMAL LOW (ref >60.00)
Glucose, Bld: 103 mg/dL — ABNORMAL HIGH (ref 70–99)
Potassium: 4 mEq/L (ref 3.5–5.1)
Sodium: 139 mEq/L (ref 135–145)

## 2022-06-23 LAB — HEMOGLOBIN A1C: Hgb A1c MFr Bld: 6.2 % (ref 4.6–6.5)

## 2022-06-23 NOTE — Progress Notes (Signed)
Subjective:    Patient ID: Jocelyn Ford, female    DOB: Mar 03, 1951, 71 y.o.   MRN: [id number]  Chief Complaint  Patient presents with   Follow-up   Hypertension    HPI Patient was seen today for f/u.  Seen by Pulm for moderate sleep apnea. CPAP settings adjusted, but pt notes increased bloating in am.  Sleep med provider referred pt to ENT but she is not sure why.  Has h/o chronic constipation for which she is using miralax.  Trying to drink more water.  States bp at home 119-140/70s-80s.  Patient denies headaches, changes in vision, LE edema.  Patient states she was unable to walk for the last 3 weeks 2/2 right lower back pain and right lateral thigh pain.  Patient states typically happens yearly over the summer.  Denies increased activity.  In the past had to get a shot for IT band symptoms at [facility].  Patient tried using topical creams, massage stick, stretching.  Was going to the gym prior to symptoms.  Did not take ibuprofen 2/2 history of CKD 3.  Past Medical History:  Diagnosis Date   Acute gouty arthropathy    Allergic rhinitis, cause unspecified    Arthritis    Chronic kidney disease    Dizziness and giddiness    Dysuria    Esophageal reflux    Heart murmur    Iron deficiency anemia, unspecified    Nonspecific abnormal electrocardiogram (ECG) (EKG)    Osteoporosis, unspecified    Pure hypercholesterolemia    Routine general medical examination at a health care facility    Unspecified essential hypertension     Allergies  Allergen Reactions   Doxycycline    Nifedipine     REACTION: Nausea,weakness    ROS General: Denies fever, chills, night sweats, changes in weight, changes in appetite HEENT: Denies headaches, ear pain, changes in vision, rhinorrhea, sore throat CV: Denies CP, palpitations, SOB, orthopnea Pulm: Denies SOB, cough, wheezing GI: Denies abdominal pain, nausea, vomiting, diarrhea +constipation, bloating GU: Denies dysuria, hematuria,  frequency, vaginal discharge Msk: Denies muscle cramps, joint pains +R lateral thigh pain, R low back pain Neuro: Denies weakness, numbness, tingling Skin: Denies rashes, bruising Psych: Denies depression, anxiety, hallucinations     Objective:    Blood pressure 132/78, pulse 88, temperature 98.2 F (36.8 C), temperature source Oral, weight 224 lb 3.2 oz (101.7 kg), SpO2 98 %.  Gen. Pleasant, well-nourished, in no distress, normal affect   HEENT: [place]/AT, face symmetric, conjunctiva clear, no scleral icterus, PERRLA, EOMI, nares patent without drainage Lungs: no accessory muscle use, CTAB, no wheezes or rales Cardiovascular: RRR, no m/r/g, no peripheral edema Abdomen: BS present, soft, NT/ND, no hepatosplenomegaly. Musculoskeletal: No deformities, no cyanosis or clubbing, normal tone Neuro:  A&Ox3, CN II-XII intact, normal gait Skin:  Warm, no lesions/ rash   Wt Readings from Last 3 Encounters:  06/23/22 224 lb 3.2 oz (101.7 kg)  04/13/22 230 lb 9.6 oz (104.6 kg)  03/10/22 227 lb 3.2 oz (103.1 kg)    Lab Results  Component Value Date   WBC 5.5 10/23/2021   HGB 12.3 10/23/2021   HCT 37.7 10/23/2021   PLT 321 10/23/2021   GLUCOSE 99 10/23/2021   CHOL 205 (H) 02/13/2020   TRIG 111.0 02/13/2020   HDL 37.10 (L) 02/13/2020   LDLDIRECT 194.6 08/21/2013   LDLCALC 146 (H) 02/13/2020   ALT 12 10/23/2021   AST 19 10/23/2021   NA 139 10/23/2021   K 3.7  10/23/2021   CL 103 10/23/2021   CREATININE 1.65 (H) 10/23/2021   BUN 16 10/23/2021   CO2 27 10/23/2021   TSH 1.43 10/21/2021   INR 1.1 (H) 01/22/2015   HGBA1C 6.1 10/21/2021    Assessment/Plan:  Essential hypertension  -Controlled -Continue current medications including losartan 100 mg daily, atenolol 25 mg twice daily - Plan: Basic metabolic panel  OSA on CPAP -Continue CPAP -Patient advised to contact pulmonary provider in regards to adjusting CPAP settings or switching to BiPAP given increased abdominal  bloating. -Patient also to contact office in regards to reason for ENT referral as she is not clear on this.  Stage 3b chronic kidney disease ([place]) -Creatinine 1.65 on 10/23/2021 -We will recheck kidney function.  May need to decrease Lasix based on this. -Encouraged to keep follow-up appointments with nephrology.  - Plan: Basic metabolic panel  Prediabetes  -Hemoglobin A1c 6.1% on 10/21/2021 -Discussed importance of lifestyle modifications - Plan: Hemoglobin A1c  It band syndrome, right -discussed stretching, topical analgesics, heat, supportive shoes -given handout -for continued or worsening symptoms follow-up with Ortho  Constipation, unspecified constipation type -Chronic -Continue increasing p.o. intake of water and fiber -Continue OTC MiraLAX as needed -For continued or worsening symptoms follow-up with GI.  F/u prn in 3-4 months  Grier Mitts, MD

## 2022-06-23 NOTE — Patient Instructions (Signed)
Do not forget to call your sleep medicine doctor in regards to CPAP settings and reason for ENT referral.

## 2022-06-24 ENCOUNTER — Telehealth: Payer: Self-pay | Admitting: Pulmonary Disease

## 2022-06-24 DIAGNOSIS — G4733 Obstructive sleep apnea (adult) (pediatric): Secondary | ICD-10-CM

## 2022-06-24 NOTE — Telephone Encounter (Signed)
Spoke with pt who states that she feels the pressure need to changed in her C Pap. She states she feels like to much air is blowing on her. She stated that Dr. Elsworth Soho had adjusted the pressure 1 other time and it seemed to help for awhile. Dr. Elsworth Soho would you to change pt's C Pap pressure? Current setting are [employer].

## 2022-06-25 ENCOUNTER — Other Ambulatory Visit: Payer: Self-pay | Admitting: Family Medicine

## 2022-06-28 DIAGNOSIS — G4733 Obstructive sleep apnea (adult) (pediatric): Secondary | ICD-10-CM | POA: Diagnosis not present

## 2022-06-28 NOTE — Telephone Encounter (Addendum)
Called and spoke with patient to let her know the recs from Dr. Elsworth Soho in regards to her CPAP pressure. She expressed understanding and order has been placed. Also advised her that we have not placed any ENT referrals from our office. She expressed understanding. Nothing further needed at this time.    Rigoberto Noel, MD  Dierdre Highman, RN; [facility] Triage Pool 28 minutes ago (4:52 PM)    Please confirm that she is on auto CPAP settings  Can lower pressure to 5 to 10 cm

## 2022-07-06 ENCOUNTER — Ambulatory Visit (INDEPENDENT_AMBULATORY_CARE_PROVIDER_SITE_OTHER): Payer: Medicare Other

## 2022-07-06 VITALS — Ht 67.0 in | Wt 224.0 lb

## 2022-07-06 DIAGNOSIS — Z0000 Encounter for general adult medical examination without abnormal findings: Secondary | ICD-10-CM

## 2022-07-06 NOTE — Progress Notes (Signed)
Subjective:   Jocelyn Ford is a 71 y.o. female who presents for Medicare Annual (Subsequent) preventive examination.  Review of Systems    Virtual Visit via Telephone Note  I connected with  Jocelyn Ford on 07/06/22 at 12:30 PM EDT by telephone and verified that I am speaking with the correct person using two identifiers.  Location: Patient: Home Provider: Office Persons participating in the virtual visit: patient/Nurse Health Advisor   I discussed the limitations, risks, security and privacy concerns of performing an evaluation and management service by telephone and the availability of in person appointments. The patient expressed understanding and agreed to proceed.  Interactive audio and video telecommunications were attempted between this nurse and patient, however failed, due to patient having technical difficulties OR patient did not have access to video capability.  We continued and completed visit with audio only.  Some vital signs may be absent or patient reported.   Jocelyn Peaches, LPN  Cardiac Risk Factors include: advanced age (>95mn, >>77women);hypertension     Objective:    Today's Vitals   07/06/22 1233  Weight: 224 lb (101.6 kg)  Height: '5\' 7"'$  (1.702 m)   Body mass index is 35.08 kg/m.     07/06/2022   12:41 PM 07/03/2021    9:52 AM 07/02/2020    9:09 AM 11/29/2016    4:50 PM 08/20/2015    2:43 PM 01/28/2015    8:34 AM  Advanced Directives  Does Patient Have a Medical Advance Directive? No No No No No No  Would patient like information on creating a medical advance directive? No - Patient declined No - Patient declined No - Patient declined   Yes - Educational materials given    Current Medications (verified) Outpatient Encounter Medications as of 07/06/2022  Medication Sig   allopurinol (ZYLOPRIM) 100 MG tablet Take 200 mg by mouth daily.   aspirin-acetaminophen-caffeine (EXCEDRIN MIGRAINE) 250-250-65 MG tablet Take 2 tablets by  mouth every 6 (six) hours as needed for headache or migraine.   atenolol (TENORMIN) 25 MG tablet TAKE 1 TABLET BY MOUTH TWICE A DAY   cholecalciferol (VITAMIN D) 25 MCG (1000 UNIT) tablet Take 1,000 Units by mouth daily.   FERROUS SULFATE PO Take 1 tablet by mouth daily.   fluticasone (FLONASE) 50 MCG/ACT nasal spray Place 1 spray into both nostrils daily as needed for allergies.   losartan (COZAAR) 100 MG tablet TAKE 1 TABLET BY MOUTH EVERY DAY   potassium chloride SA (KLOR-CON M20) 20 MEQ tablet TAKE 3 TABLETS BY MOUTH 3 TIMES DAILY. -INS WILL ONLY PAY FOR 5 A DAY   No facility-administered encounter medications on file as of 07/06/2022.    Allergies (verified) Doxycycline and Nifedipine   History: Past Medical History:  Diagnosis Date   Acute gouty arthropathy    Allergic rhinitis, cause unspecified    Arthritis    Chronic kidney disease    Dizziness and giddiness    Dysuria    Esophageal reflux    Heart murmur    Iron deficiency anemia, unspecified    Nonspecific abnormal electrocardiogram (ECG) (EKG)    Osteoporosis, unspecified    Pure hypercholesterolemia    Routine general medical examination at a health care facility    Unspecified essential hypertension    Past Surgical History:  Procedure Laterality Date   ABDOMINAL HYSTERECTOMY     LEFT HEART CATHETERIZATION WITH CORONARY ANGIOGRAM N/A 01/28/2015   Procedure: LEFT HEART CATHETERIZATION WITH CORONARY ANGIOGRAM;  Surgeon: MElta Guadeloupe  Marlou Porch, MD;  Location: [facility];  Service: Cardiovascular;  Laterality: N/A;   Family History  Problem Relation Age of Onset   Cancer Father        Prostate   Hypertension Father    Alzheimer's disease Mother    Hypertension Mother    Kidney disease Mother    Diabetes Sister    Hypertension Sister    Heart Problems Brother        stent   Hypertension Brother    Healthy Daughter    Hypertension Sister    Breast cancer Sister    Social History   Socioeconomic History   Marital  status: Divorced    Spouse name: n/a   Number of children: 1   Years of education: 13   Highest education level: 12th grade  Occupational History   Occupation: IT sales professional: [employer]  Tobacco Use   Smoking status: Former    Packs/day: 1.00    Years: 10.00    Total pack years: 10.00    Types: Cigarettes    Quit date: 01/02/1985    Years since quitting: 37.5   Smokeless tobacco: Never  Vaping Use   Vaping Use: Never used  Substance and Sexual Activity   Alcohol use: No    Alcohol/week: 0.0 standard drinks of alcohol   Drug use: Never   Sexual activity: Never  Other Topics Concern   Not on file  Social History Narrative   Lives alone.  Her daughter lives nearby.   Social Determinants of Health   Financial Resource Strain: Low Risk  (07/06/2022)   Overall Financial Resource Strain (CARDIA)    Difficulty of Paying Living Expenses: Not hard at all  Food Insecurity: No Food Insecurity (07/06/2022)   Hunger Vital Sign    Worried About Running Out of Food in the Last Year: Never true    Ran Out of Food in the Last Year: Never true  Transportation Needs: No Transportation Needs (07/06/2022)   PRAPARE - Hydrologist (Medical): No    Lack of Transportation (Non-Medical): No  Physical Activity: Insufficiently Active (07/06/2022)   Exercise Vital Sign    Days of Exercise per Week: 2 days    Minutes of Exercise per Session: 20 min  Stress: No Stress Concern Present (07/06/2022)   [place]    Feeling of Stress : Not at all  Social Connections: Moderately Integrated (07/06/2022)   Social Connection and Isolation Panel [NHANES]    Frequency of Communication with Friends and Family: More than three times a week    Frequency of Social Gatherings with Friends and Family: More than three times a week    Attends Religious Services: More than 4 times per year    Active  Member of [employer] or Organizations: Yes    Attends Archivist Meetings: 1 to 4 times per year    Marital Status: Divorced    Tobacco Counseling Counseling given: Not Answered   Clinical Intake:  Pre-visit preparation completed: Yes  Pain : No/denies pain     BMI - recorded: 36.11 Nutritional Status: BMI > 30  Obese Nutritional Risks: None Diabetes: No  How often do you need to have someone help you when you read instructions, pamphlets, or other written materials from your doctor or pharmacy?: 1 - Never  Diabetic? No Activities of Daily Living    07/06/2022   12:39 PM 07/04/2022  9:49 AM  In your present state of health, do you have any difficulty performing the following activities:  Hearing? 0 0  Vision? 0 0  Difficulty concentrating or making decisions? 0 0  Walking or climbing stairs? 0 0  Dressing or bathing? 0 0  Doing errands, shopping? 0 0  Preparing Food and eating ? N N  Using the Toilet? N N  In the past six months, have you accidently leaked urine? N N  Do you have problems with loss of bowel control? N N  Managing your Medications? N N  Managing your Finances? N N  Housekeeping or managing your Housekeeping? N N    Patient Care Team: [name], MD as PCP - General (Family Medicine) Leo Grosser Seymour Bars, MD (Inactive) as Consulting Physician (Obstetrics and Gynecology) Gordan Payment, OD (Optometry)  Indicate any recent Medical Services you may have received from other than [facility] providers in the past year (date may be approximate).     Assessment:   This is a routine wellness examination for Ericca.  Hearing/Vision screen Hearing Screening - Comments:: No hearing difficulty Vision Screening - Comments:: Wears glasses. Followed by Dr Katy Fitch  Dietary issues and exercise activities discussed: Exercise limited by: None identified   Goals Addressed               This Visit's Progress     Weight (lb) < 200 lb (90.7 kg)  (pt-stated)   224 lb (101.6 kg)     I want to lose about 15lbs.       Depression Screen    07/06/2022   12:37 PM 03/10/2022   10:32 AM 12/23/2021   10:33 AM 07/03/2021    9:53 AM 07/03/2021    9:50 AM 07/02/2020    9:12 AM 02/13/2020    8:10 AM  PHQ 2/9 Scores  PHQ - 2 Score 0 0 0 0 0 0 0  PHQ- 9 Score  2 0   0     Fall Risk    07/06/2022   12:40 PM 07/04/2022    9:49 AM 03/10/2022   10:32 AM 12/23/2021   10:33 AM 10/20/2021    6:49 PM  Fall Risk   Falls in the past year? 0 0 0 0 0  Number falls in past yr: 0  0    Injury with Fall? 0  0    Risk for fall due to : No Fall Risks  No Fall Risks    Follow up   Falls evaluation completed      FALL RISK PREVENTION PERTAINING TO THE HOME:  Any stairs in or around the home? No If so, are there any without handrails? No  Home free of loose throw rugs in walkways, pet beds, electrical cords, etc? Yes  Adequate lighting in your home to reduce risk of falls? Yes   ASSISTIVE DEVICES UTILIZED TO PREVENT FALLS:  Life alert? No  Use of a cane, walker or w/c? No  Grab bars in the bathroom? No  Shower chair or bench in shower? No  Elevated toilet seat or a handicapped toilet? No   TIMED UP AND GO:  Was the test performed? No . Audio Visit  Cognitive Function:        07/06/2022   12:41 PM  6CIT Screen  What Year? 0 points  What month? 0 points  What time? 0 points  Count back from 20 0 points  Months in reverse 0 points  Repeat phrase 0  points  Total Score 0 points    Immunizations Immunization History  Administered Date(s) Administered   Fluad Quad(high Dose 65+) 08/14/2019   Influenza Whole 10/20/2009   Influenza, High Dose Seasonal PF 11/02/2018   Influenza,inj,Quad PF,6+ Mos 09/27/2014   Influenza-Unspecified 10/20/2016, 08/14/2019, 09/11/2020, 09/16/2021   PFIZER(Purple Top)SARS-COV-2 Vaccination 01/25/2020, 02/15/2020, 09/25/2020   Td 10/17/2008   Zoster, Live 08/20/2015    TDAP status: Due, Education has been  provided regarding the importance of this vaccine. Advised may receive this vaccine at local pharmacy or Health Dept. Aware to provide a copy of the vaccination record if obtained from local pharmacy or Health Dept. Verbalized acceptance and understanding.  Flu Vaccine status: Up to date  Pneumococcal vaccine status: Due, Education has been provided regarding the importance of this vaccine. Advised may receive this vaccine at local pharmacy or Health Dept. Aware to provide a copy of the vaccination record if obtained from local pharmacy or Health Dept. Verbalized acceptance and understanding.  Covid-19 vaccine status: Completed vaccines  Qualifies for Shingles Vaccine? Yes   Zostavax completed No   Shingrix Completed?: No.    Education has been provided regarding the importance of this vaccine. Patient has been advised to call insurance company to determine out of pocket expense if they have not yet received this vaccine. Advised may also receive vaccine at local pharmacy or Health Dept. Verbalized acceptance and understanding.  Screening Tests Health Maintenance  Topic Date Due   COVID-19 Vaccine (4 - Booster for Pfizer series) 07/22/2022 (Originally 11/20/2020)   Zoster Vaccines- Shingrix (1 of 2) 10/06/2022 (Originally 02/02/1970)   Pneumonia Vaccine 20+ Years old (1 - PCV) 07/07/2023 (Originally 02/01/2018)   TETANUS/TDAP  07/07/2023 (Originally 10/17/2018)   INFLUENZA VACCINE  07/20/2022   MAMMOGRAM  12/29/2022   COLONOSCOPY (Pts 45-55yr Insurance coverage will need to be confirmed)  12/08/2023   DEXA SCAN  Completed   Hepatitis C Screening  Completed   HPV VACCINES  Aged Out    Health Maintenance  There are no preventive care reminders to display for this patient.   Colorectal cancer screening: Type of screening: Colonoscopy. Completed 02/07/13. Repeat every 10 years  Mammogram status: Completed 12/29/21. Repeat every year  Bone Density status: Completed 12/05/20. Results  reflect: Bone density results: OSTEOPOROSIS. Repeat every   years.  Lung Cancer Screening: (Low Dose CT Chest recommended if Age 69-80years, 30 pack-year currently smoking OR have quit w/in 15years.) does not qualify.    Additional Screening:  Hepatitis C Screening: does qualify; Completed 11/29/16  Vision Screening: Recommended annual ophthalmology exams for early detection of glaucoma and other disorders of the eye. Is the patient up to date with their annual eye exam?  Yes  Who is the provider or what is the name of the office in which the patient attends annual eye exams? Dr GKaty FitchIf pt is not established with a provider, would they like to be referred to a provider to establish care? No .   Dental Screening: Recommended annual dental exams for proper oral hygiene  Community Resource Referral / Chronic Care Management:   CRR required this visit?  No   CCM required this visit?  No      Plan:     I have personally reviewed and noted the following in the patient's chart:   Medical and social history Use of alcohol, tobacco or illicit drugs  Current medications and supplements including opioid prescriptions.  Functional ability and status Nutritional status Physical activity Advanced  directives List of other physicians Hospitalizations, surgeries, and ER visits in previous 12 months Vitals Screenings to include cognitive, depression, and falls Referrals and appointments  In addition, I have reviewed and discussed with patient certain preventive protocols, quality metrics, and best practice recommendations. A written personalized care plan for preventive services as well as general preventive health recommendations were provided to patient.     Jocelyn Peaches, LPN   [id number]   Nurse Notes: None

## 2022-07-06 NOTE — Patient Instructions (Signed)
[name] , Thank you for taking time to come for your Medicare Wellness Visit. I appreciate your ongoing commitment to your health goals. Please review the following plan we discussed and let me know if I can assist you in the future.   These are the goals we discussed:  Goals       Patient Stated      I will continue to go to the [facility] 3x per week for 30- 40 minutes      Weight (lb) < 200 lb (90.7 kg) (pt-stated)      I want to lose about 15lbs.        This is a list of the screening recommended for you and due dates:  Health Maintenance  Topic Date Due   Zoster (Shingles) Vaccine (1 of 2) Never done   Pneumonia Vaccine (1 - PCV) Never done   Tetanus Vaccine  10/17/2018   COVID-19 Vaccine (4 - Booster for Pfizer series) 11/20/2020   Flu Shot  07/20/2022   Mammogram  12/29/2022   Colon Cancer Screening  12/08/2023   DEXA scan (bone density measurement)  Completed   Hepatitis C Screening: USPSTF Recommendation to screen - Ages 37-79 yo.  Completed   HPV Vaccine  Aged Out    Advanced directives: No  Conditions/risks identified: None  Next appointment: Follow up in one year for your annual wellness visit     Preventive Care 65 Years and Older, Female Preventive care refers to lifestyle choices and visits with your health care provider that can promote health and wellness. What does preventive care include? A yearly physical exam. This is also called an annual well check. Dental exams once or twice a year. Routine eye exams. Ask your health care provider how often you should have your eyes checked. Personal lifestyle choices, including: Daily care of your teeth and gums. Regular physical activity. Eating a healthy diet. Avoiding tobacco and drug use. Limiting alcohol use. Practicing safe sex. Taking low-dose aspirin every day. Taking vitamin and mineral supplements as recommended by your health care provider. What happens during an annual well check? The services  and screenings done by your health care provider during your annual well check will depend on your age, overall health, lifestyle risk factors, and family history of disease. Counseling  Your health care provider may ask you questions about your: Alcohol use. Tobacco use. Drug use. Emotional well-being. Home and relationship well-being. Sexual activity. Eating habits. History of falls. Memory and ability to understand (cognition). Work and work Statistician. Reproductive health. Screening  You may have the following tests or measurements: Height, weight, and BMI. Blood pressure. Lipid and cholesterol levels. These may be checked every 5 years, or more frequently if you are over 40 years old. Skin check. Lung cancer screening. You may have this screening every year starting at age 63 if you have a 30-pack-year history of smoking and currently smoke or have quit within the past 15 years. Fecal occult blood test (FOBT) of the stool. You may have this test every year starting at age 8. Flexible sigmoidoscopy or colonoscopy. You may have a sigmoidoscopy every 5 years or a colonoscopy every 10 years starting at age 9. Hepatitis C blood test. Hepatitis B blood test. Sexually transmitted disease (STD) testing. Diabetes screening. This is done by checking your blood sugar (glucose) after you have not eaten for a while (fasting). You may have this done every 1-3 years. Bone density scan. This is done to screen for  osteoporosis. You may have this done starting at age 64. Mammogram. This may be done every 1-2 years. Talk to your health care provider about how often you should have regular mammograms. Talk with your health care provider about your test results, treatment options, and if necessary, the need for more tests. Vaccines  Your health care provider may recommend certain vaccines, such as: Influenza vaccine. This is recommended every year. Tetanus, diphtheria, and acellular pertussis  (Tdap, Td) vaccine. You may need a Td booster every 10 years. Zoster vaccine. You may need this after age 93. Pneumococcal 13-valent conjugate (PCV13) vaccine. One dose is recommended after age 52. Pneumococcal polysaccharide (PPSV23) vaccine. One dose is recommended after age 61. Talk to your health care provider about which screenings and vaccines you need and how often you need them. This information is not intended to replace advice given to you by your health care provider. Make sure you discuss any questions you have with your health care provider. Document Released: 01/02/2016 Document Revised: 08/25/2016 Document Reviewed: 10/07/2015 Elsevier Interactive Patient Education  2017 [place] Prevention in the Home Falls can cause injuries. They can happen to people of all ages. There are many things you can do to make your home safe and to help prevent falls. What can I do on the outside of my home? Regularly fix the edges of walkways and driveways and fix any cracks. Remove anything that might make you trip as you walk through a door, such as a raised step or threshold. Trim any bushes or trees on the path to your home. Use bright outdoor lighting. Clear any walking paths of anything that might make someone trip, such as rocks or tools. Regularly check to see if handrails are loose or broken. Make sure that both sides of any steps have handrails. Any raised decks and porches should have guardrails on the edges. Have any leaves, snow, or ice cleared regularly. Use sand or salt on walking paths during winter. Clean up any spills in your garage right away. This includes oil or grease spills. What can I do in the bathroom? Use night lights. Install grab bars by the toilet and in the tub and shower. Do not use towel bars as grab bars. Use non-skid mats or decals in the tub or shower. If you need to sit down in the shower, use a plastic, non-slip stool. Keep the floor dry. Clean  up any water that spills on the floor as soon as it happens. Remove soap buildup in the tub or shower regularly. Attach bath mats securely with double-sided non-slip rug tape. Do not have throw rugs and other things on the floor that can make you trip. What can I do in the bedroom? Use night lights. Make sure that you have a light by your bed that is easy to reach. Do not use any sheets or blankets that are too big for your bed. They should not hang down onto the floor. Have a firm chair that has side arms. You can use this for support while you get dressed. Do not have throw rugs and other things on the floor that can make you trip. What can I do in the kitchen? Clean up any spills right away. Avoid walking on wet floors. Keep items that you use a lot in easy-to-reach places. If you need to reach something above you, use a strong step stool that has a grab bar. Keep electrical cords out of the way. Do not  use floor polish or wax that makes floors slippery. If you must use wax, use non-skid floor wax. Do not have throw rugs and other things on the floor that can make you trip. What can I do with my stairs? Do not leave any items on the stairs. Make sure that there are handrails on both sides of the stairs and use them. Fix handrails that are broken or loose. Make sure that handrails are as long as the stairways. Check any carpeting to make sure that it is firmly attached to the stairs. Fix any carpet that is loose or worn. Avoid having throw rugs at the top or bottom of the stairs. If you do have throw rugs, attach them to the floor with carpet tape. Make sure that you have a light switch at the top of the stairs and the bottom of the stairs. If you do not have them, ask someone to add them for you. What else can I do to help prevent falls? Wear shoes that: Do not have high heels. Have rubber bottoms. Are comfortable and fit you well. Are closed at the toe. Do not wear sandals. If you  use a stepladder: Make sure that it is fully opened. Do not climb a closed stepladder. Make sure that both sides of the stepladder are locked into place. Ask someone to hold it for you, if possible. Clearly mark and make sure that you can see: Any grab bars or handrails. First and last steps. Where the edge of each step is. Use tools that help you move around (mobility aids) if they are needed. These include: Canes. Walkers. Scooters. Crutches. Turn on the lights when you go into a dark area. Replace any light bulbs as soon as they burn out. Set up your furniture so you have a clear path. Avoid moving your furniture around. If any of your floors are uneven, fix them. If there are any pets around you, be aware of where they are. Review your medicines with your doctor. Some medicines can make you feel dizzy. This can increase your chance of falling. Ask your doctor what other things that you can do to help prevent falls. This information is not intended to replace advice given to you by your health care provider. Make sure you discuss any questions you have with your health care provider. Document Released: 10/02/2009 Document Revised: 05/13/2016 Document Reviewed: 01/10/2015 Elsevier Interactive Patient Education  2017 [employer].

## 2022-07-12 ENCOUNTER — Ambulatory Visit: Payer: Medicare Other | Admitting: Primary Care

## 2022-07-12 ENCOUNTER — Encounter: Payer: Self-pay | Admitting: Primary Care

## 2022-07-12 DIAGNOSIS — G4700 Insomnia, unspecified: Secondary | ICD-10-CM

## 2022-07-12 DIAGNOSIS — G4733 Obstructive sleep apnea (adult) (pediatric): Secondary | ICD-10-CM | POA: Diagnosis not present

## 2022-07-12 NOTE — Progress Notes (Signed)
$'@Patient'L$  ID: Jocelyn Ford, female    DOB: 1951/08/23, 71 y.o.   MRN: [id number]  Chief Complaint  Patient presents with   Follow-up    Not using C Pap r/t back and leg pain and rash    Referring provider: [name], MD  HPI: 71 year old female, former smoker, quit in January 1986. PMH significant for OSA, HTN, allergic rhinitis, pulmonary nodule, GERD, hyperparathyroidism, osteoporosis. Patient of Dr. Elsworth Soho.   07/12/2022 Patient presents today for OSA follow-up. HST on 01/2022 showed moderate OSA, AHI 17/hr with SpO2 low 70%. She is having a hard time sleeping at night d/t back pain. She is not particularly having a difficulty time with CPAP mask fit or pressure setting. She has taken benadryl in the past which has helped her sleep.   Airview download 06/09/22-07/08/22 9/30 days used (30%); 2 days (7%) > 4 hours Average usage 2 hours 57 mins Pressure 5-10cm h20 (7cm h30-95%) AHI 0.4    Allergies  Allergen Reactions   Doxycycline    Nifedipine     REACTION: Nausea,weakness    Immunization History  Administered Date(s) Administered   Fluad Quad(high Dose 65+) 08/14/2019   Influenza Whole 10/20/2009   Influenza, High Dose Seasonal PF 11/02/2018   Influenza,inj,Quad PF,6+ Mos 09/27/2014   Influenza-Unspecified 10/20/2016, 08/14/2019, 09/11/2020, 09/16/2021   PFIZER(Purple Top)SARS-COV-2 Vaccination 01/25/2020, 02/15/2020, 09/25/2020   Td 10/17/2008   Zoster, Live 08/20/2015    Past Medical History:  Diagnosis Date   Acute gouty arthropathy    Allergic rhinitis, cause unspecified    Arthritis    Chronic kidney disease    Dizziness and giddiness    Dysuria    Esophageal reflux    Heart murmur    Iron deficiency anemia, unspecified    Nonspecific abnormal electrocardiogram (ECG) (EKG)    Osteoporosis, unspecified    Pure hypercholesterolemia    Routine general medical examination at a health care facility    Unspecified essential hypertension      Tobacco History: Social History   Tobacco Use  Smoking Status Former   Packs/day: 1.00   Years: 10.00   Total pack years: 10.00   Types: Cigarettes   Quit date: 01/02/1985   Years since quitting: 37.5  Smokeless Tobacco Never   Counseling given: Not Answered   Outpatient Medications Prior to Visit  Medication Sig Dispense Refill   allopurinol (ZYLOPRIM) 100 MG tablet Take 200 mg by mouth daily.     aspirin-acetaminophen-caffeine (EXCEDRIN MIGRAINE) 250-250-65 MG tablet Take 2 tablets by mouth every 6 (six) hours as needed for headache or migraine.     atenolol (TENORMIN) 25 MG tablet TAKE 1 TABLET BY MOUTH TWICE A DAY 180 tablet 1   cholecalciferol (VITAMIN D) 25 MCG (1000 UNIT) tablet Take 1,000 Units by mouth daily.     FERROUS SULFATE PO Take 1 tablet by mouth daily.     fluticasone (FLONASE) 50 MCG/ACT nasal spray Place 1 spray into both nostrils daily as needed for allergies. 9.9 mL 5   losartan (COZAAR) 100 MG tablet TAKE 1 TABLET BY MOUTH EVERY DAY 90 tablet 1   potassium chloride SA (KLOR-CON M20) 20 MEQ tablet TAKE 3 TABLETS BY MOUTH 3 TIMES DAILY. -INS WILL ONLY PAY FOR 5 A DAY 540 tablet 1   No facility-administered medications prior to visit.    Review of Systems  Review of Systems  Constitutional: Negative.   HENT: Negative.    Respiratory: Negative.    Musculoskeletal:  Positive for  back pain.  Skin:  Positive for rash.    Physical Exam  BP 136/78 (BP Location: Right Arm, Patient Position: Sitting, Cuff Size: Large)   Pulse 76   Temp 98.2 F (36.8 C) (Oral)   Ht '5\' 7"'$  (1.702 m)   Wt 221 lb 6.4 oz (100.4 kg)   SpO2 99%   BMI 34.68 kg/m  Physical Exam Constitutional:      Appearance: Normal appearance.  HENT:     Head: Normocephalic.     Mouth/Throat:     Mouth: Mucous membranes are moist.     Pharynx: Oropharynx is clear.  Cardiovascular:     Rate and Rhythm: Normal rate and regular rhythm.  Pulmonary:     Effort: Pulmonary effort is  normal.     Breath sounds: Normal breath sounds.  Skin:    General: Skin is warm and dry.  Neurological:     General: No focal deficit present.     Mental Status: She is alert and oriented to person, place, and time. Mental status is at baseline.  Psychiatric:        Mood and Affect: Mood normal.        Behavior: Behavior normal.        Thought Content: Thought content normal.        Judgment: Judgment normal.      Lab Results:  CBC    Component Value Date/Time   WBC 5.5 10/23/2021 1929   RBC 4.32 10/23/2021 1929   HGB 12.3 10/23/2021 1929   HCT 37.7 10/23/2021 1929   PLT 321 10/23/2021 1929   MCV 87.3 10/23/2021 1929   MCV 85.2 03/05/2015 1141   MCH 28.5 10/23/2021 1929   MCHC 32.6 10/23/2021 1929   RDW 13.6 10/23/2021 1929   LYMPHSABS 2.5 10/23/2021 1929   MONOABS 0.5 10/23/2021 1929   EOSABS 0.2 10/23/2021 1929   BASOSABS 0.0 10/23/2021 1929    BMET    Component Value Date/Time   NA 139 06/23/2022 1115   K 4.0 06/23/2022 1115   CL 104 06/23/2022 1115   CO2 27 06/23/2022 1115   GLUCOSE 103 (H) 06/23/2022 1115   BUN 17 06/23/2022 1115   CREATININE 1.47 (H) 06/23/2022 1115   CREATININE 1.18 (H) 04/13/2013 1226   CALCIUM 9.9 06/23/2022 1115   CALCIUM 9.6 08/19/2011 1539   GFRNONAA 34 (L) 10/23/2021 1929   GFRAA 50 (L) 01/06/2013 1518    BNP    Component Value Date/Time   BNP 24.5 10/23/2021 1930    ProBNP    Component Value Date/Time   PROBNP 13.0 10/21/2021 1103    Imaging: No results found.   Assessment & Plan:   OSA (obstructive sleep apnea) - Home sleep study in February 2023 showed moderate obstructive sleep apnea, AHI 17 an hour. She is maintained on auto CPAP, no issues with mask fit or pressure setting. Having difficulty sleeping d.t back pain. Encourage enhanced compliance with CPAP, advised she aim to wear every night for min 4-6 hours or longer. No pressure changes needs. FU in 3 months for compliance check or sooner if needed.    Insomnia - Recommend patient take 25-'50mg'$  diphenhydramine at bedtime to help with insomnia symptoms   Martyn Ehrich, NP 07/12/2022

## 2022-07-12 NOTE — Assessment & Plan Note (Signed)
-   Home sleep study in February 2023 showed moderate obstructive sleep apnea, AHI 17 an hour. She is maintained on auto CPAP, no issues with mask fit or pressure setting. Having difficulty sleeping d.t back pain. Encourage enhanced compliance with CPAP, advised she aim to wear every night for min 4-6 hours or longer. No pressure changes needs. FU in 3 months for compliance check or sooner if needed.

## 2022-07-12 NOTE — Patient Instructions (Signed)
Recommendations: -Try diphenhydramine (benadryl) 25-'50mg'$  at bedtime to help with sleep  - Aim to wear cpap every night for 4-6 hours or more - If you continued to have trouble sleeping please let us know and we can prescribed something   Follow-up: - 3 months with Dr. Elsworth Soho or [facility] NP  Sleep Apnea Sleep apnea affects breathing during sleep. It causes breathing to stop for 10 seconds or more, or to become shallow. People with sleep apnea usually snore loudly. It can also increase the risk of: Heart attack. Stroke. Being very overweight (obese). Diabetes. Heart failure. Irregular heartbeat. High blood pressure. The goal of treatment is to help you breathe normally again. What are the causes?  The most common cause of this condition is a collapsed or blocked airway. There are three kinds of sleep apnea: Obstructive sleep apnea. This is caused by a blocked or collapsed airway. Central sleep apnea. This happens when the brain does not send the right signals to the muscles that control breathing. Mixed sleep apnea. This is a combination of obstructive and central sleep apnea. What increases the risk? Being overweight. Smoking. Having a small airway. Being older. Being female. Drinking alcohol. Taking medicines to calm yourself (sedatives or tranquilizers). Having family members with the condition. Having a tongue or tonsils that are larger than normal. What are the signs or symptoms? Trouble staying asleep. Loud snoring. Headaches in the morning. Waking up gasping. Dry mouth or sore throat in the morning. Being sleepy or tired during the day. If you are sleepy or tired during the day, you may also: Not be able to focus your mind (concentrate). Forget things. Get angry a lot and have mood swings. Feel sad (depressed). Have changes in your personality. Have less interest in sex, if you are female. Be unable to have an erection, if you are female. How is this treated?  Sleeping  on your side. Using a medicine to get rid of mucus in your nose (decongestant). Avoiding the use of alcohol, medicines to help you relax, or certain pain medicines (narcotics). Losing weight, if needed. Changing your diet. Quitting smoking. Using a machine to open your airway while you sleep, such as: An oral appliance. This is a mouthpiece that shifts your lower jaw forward. A CPAP device. This device blows air through a mask when you breathe out (exhale). An EPAP device. This has valves that you put in each nostril. A BIPAP device. This device blows air through a mask when you breathe in (inhale) and breathe out. Having surgery if other treatments do not work. Follow these instructions at home: Lifestyle Make changes that your doctor recommends. Eat a healthy diet. Lose weight if needed. Avoid alcohol, medicines to help you relax, and some pain medicines. Do not smoke or use any products that contain nicotine or tobacco. If you need help quitting, ask your doctor. General instructions Take over-the-counter and prescription medicines only as told by your doctor. If you were given a machine to use while you sleep, use it only as told by your doctor. If you are having surgery, make sure to tell your doctor you have sleep apnea. You may need to bring your device with you. Keep all follow-up visits. Contact a doctor if: The machine that you were given to use during sleep bothers you or does not seem to be working. You do not get better. You get worse. Get help right away if: Your chest hurts. You have trouble breathing in enough air. You have  an uncomfortable feeling in your back, arms, or stomach. You have trouble talking. One side of your body feels weak. A part of your face is hanging down. These symptoms may be an emergency. Get help right away. Call your local emergency services (911 in the [place] Do not wait to see if the symptoms will go away. Do not drive yourself to the  hospital. Summary This condition affects breathing during sleep. The most common cause is a collapsed or blocked airway. The goal of treatment is to help you breathe normally while you sleep. This information is not intended to replace advice given to you by your health care provider. Make sure you discuss any questions you have with your health care provider. Document Revised: 07/15/2021 Document Reviewed: 11/14/2020 Elsevier Patient Education  [place].

## 2022-07-12 NOTE — Assessment & Plan Note (Signed)
-   Recommend patient take 25-'50mg'$  diphenhydramine at bedtime to help with insomnia symptoms

## 2022-07-24 ENCOUNTER — Other Ambulatory Visit: Payer: Self-pay | Admitting: Family Medicine

## 2022-07-24 DIAGNOSIS — I10 Essential (primary) hypertension: Secondary | ICD-10-CM

## 2022-07-29 DIAGNOSIS — G4733 Obstructive sleep apnea (adult) (pediatric): Secondary | ICD-10-CM | POA: Diagnosis not present

## 2022-08-04 DIAGNOSIS — I129 Hypertensive chronic kidney disease with stage 1 through stage 4 chronic kidney disease, or unspecified chronic kidney disease: Secondary | ICD-10-CM | POA: Diagnosis not present

## 2022-08-04 DIAGNOSIS — D509 Iron deficiency anemia, unspecified: Secondary | ICD-10-CM | POA: Diagnosis not present

## 2022-08-04 DIAGNOSIS — N183 Chronic kidney disease, stage 3 unspecified: Secondary | ICD-10-CM | POA: Diagnosis not present

## 2022-08-04 DIAGNOSIS — R7303 Prediabetes: Secondary | ICD-10-CM | POA: Diagnosis not present

## 2022-08-04 DIAGNOSIS — E213 Hyperparathyroidism, unspecified: Secondary | ICD-10-CM | POA: Diagnosis not present

## 2022-08-04 DIAGNOSIS — M109 Gout, unspecified: Secondary | ICD-10-CM | POA: Diagnosis not present

## 2022-08-05 DIAGNOSIS — N183 Chronic kidney disease, stage 3 unspecified: Secondary | ICD-10-CM | POA: Diagnosis not present

## 2022-08-09 ENCOUNTER — Encounter: Payer: Self-pay | Admitting: Family Medicine

## 2022-08-09 ENCOUNTER — Ambulatory Visit (INDEPENDENT_AMBULATORY_CARE_PROVIDER_SITE_OTHER): Payer: Medicare Other | Admitting: Family Medicine

## 2022-08-09 VITALS — BP 126/82 | HR 79 | Temp 98.6°F | Wt 217.6 lb

## 2022-08-09 DIAGNOSIS — L723 Sebaceous cyst: Secondary | ICD-10-CM | POA: Diagnosis not present

## 2022-08-09 DIAGNOSIS — I10 Essential (primary) hypertension: Secondary | ICD-10-CM | POA: Diagnosis not present

## 2022-08-09 NOTE — Progress Notes (Signed)
HPI Subjective:    Patient ID: Jocelyn Ford, female    DOB: Jul 03, 1951, 71 y.o.   MRN: [id number]  Chief Complaint  Patient presents with   Mass    Under left arm, in arm pit. Noticed it Friday. Started out as a boil, but is soft now. Is moveable, large. Did not put anything on it, when first noticed it, pus was coming out.    Patient is a 71 year old female with pmh sig for HTN, CKD, gout, seasonal allergies, HLD, osteoporosis who was seen today for acute concern follow-up.  Patient endorse noticing a bump in left armpit on Friday.  Area was tender to touch and may have been draining.  Denies fever, chills, changes in soaps, lotions, detergents.  Had recent follow-up with nephrology.  States labs were.  BP was elevated in office. Patient advised to keep log of BP at home and send readings to nephrology office.  BP at home 140/73, 127/66, 127/66, 119/53, 128/62.  Taking losartan 100 mg daily, atenolol 25 mg twice daily.  Past Medical History:  Diagnosis Date   Acute gouty arthropathy    Allergic rhinitis, cause unspecified    Arthritis    Chronic kidney disease    Dizziness and giddiness    Dysuria    Esophageal reflux    Heart murmur    Iron deficiency anemia, unspecified    Nonspecific abnormal electrocardiogram (ECG) (EKG)    Osteoporosis, unspecified    Pure hypercholesterolemia    Routine general medical examination at a health care facility    Unspecified essential hypertension     Allergies  Allergen Reactions   Doxycycline    Nifedipine     REACTION: Nausea,weakness    ROS General: Denies fever, chills, night sweats, changes in weight, changes in appetite HEENT: Denies headaches, ear pain, changes in vision, rhinorrhea, sore throat CV: Denies CP, palpitations, SOB, orthopnea Pulm: Denies SOB, cough, wheezing GI: Denies abdominal pain, nausea, vomiting, diarrhea, constipation GU: Denies dysuria, hematuria, frequency, vaginal discharge Msk: Denies muscle  cramps, joint pains Neuro: Denies weakness, numbness, tingling Skin: Denies rashes, bruising  + bump underneath left armpit Psych: Denies depression, anxiety, hallucinations      Objective:    Blood pressure (!) 152/96, pulse 79, temperature 98.6 F (37 C), temperature source Oral, weight 217 lb 9.6 oz (98.7 kg), SpO2 97 %.  Gen. Pleasant, well-nourished, in no distress, normal affect   HEENT: [place]/AT, face symmetric, conjunctiva clear, no scleral icterus, PERRLA, EOMI, nares patent without drainage, pharynx without erythema or exudate. Neck: No JVD, no thyromegaly, no carotid bruits Lungs: no accessory muscle use, CTAB, no wheezes or rales Cardiovascular: RRR, no m/r/g, no peripheral edema Musculoskeletal: No deformities, no cyanosis or clubbing, normal tone Neuro:  A&Ox3, CN II-XII intact, normal gait Skin:  Warm, no lesions/ rash.  Nontender, fluctuant cyst, approximately 2 cm, in L axilla without drainage, increased warmth, or  erythema.   Wt Readings from Last 3 Encounters:  08/09/22 217 lb 9.6 oz (98.7 kg)  07/12/22 221 lb 6.4 oz (100.4 kg)  07/06/22 224 lb (101.6 kg)    Lab Results  Component Value Date   WBC 5.5 10/23/2021   HGB 12.3 10/23/2021   HCT 37.7 10/23/2021   PLT 321 10/23/2021   GLUCOSE 103 (H) 06/23/2022   CHOL 205 (H) 02/13/2020   TRIG 111.0 02/13/2020   HDL 37.10 (L) 02/13/2020   LDLDIRECT 194.6 08/21/2013   LDLCALC 146 (H) 02/13/2020   ALT 12 10/23/2021  AST 19 10/23/2021   NA 139 06/23/2022   K 4.0 06/23/2022   CL 104 06/23/2022   CREATININE 1.47 (H) 06/23/2022   BUN 17 06/23/2022   CO2 27 06/23/2022   TSH 1.43 10/21/2021   INR 1.1 (H) 01/22/2015   HGBA1C 6.2 06/23/2022    Assessment/Plan:  Cyst of left axilla -Resolving -Discussed supportive care including warm compresses to promote drainage -Advised most cyst can be self-limiting however some require I&D and ABX -Given handout -Patient to monitor for S/S of infection and notify  clinic if develops.  Essential hypertension -Controlled -Continue current medications including losartan 100 mg daily and atenolol 25 mg twice daily -Continue lifestyle modifications -Continue checking BP at home and keep a log to bring with you to clinic -Continue follow-up with nephrology  F/u as needed for increased or worsening symptoms of cyst  Grier Mitts, MD

## 2022-08-09 NOTE — Patient Instructions (Signed)
Using warm compresses to the cyst for 10 minutes at the time few times per day to see if we will continue draining.  A lot of times the cyst will resolve on its own.  Sometimes they become infected and require drainage in office.  If you notice any redness, increased swelling, tenderness, fever, chills, etc. notify the clinic.

## 2022-08-24 DIAGNOSIS — H0102B Squamous blepharitis left eye, upper and lower eyelids: Secondary | ICD-10-CM | POA: Diagnosis not present

## 2022-08-24 DIAGNOSIS — H0102A Squamous blepharitis right eye, upper and lower eyelids: Secondary | ICD-10-CM | POA: Diagnosis not present

## 2022-08-24 DIAGNOSIS — H02885 Meibomian gland dysfunction left lower eyelid: Secondary | ICD-10-CM | POA: Diagnosis not present

## 2022-08-24 DIAGNOSIS — H04123 Dry eye syndrome of bilateral lacrimal glands: Secondary | ICD-10-CM | POA: Diagnosis not present

## 2022-08-24 DIAGNOSIS — Z961 Presence of intraocular lens: Secondary | ICD-10-CM | POA: Diagnosis not present

## 2022-08-24 DIAGNOSIS — H1045 Other chronic allergic conjunctivitis: Secondary | ICD-10-CM | POA: Diagnosis not present

## 2022-08-29 DIAGNOSIS — G4733 Obstructive sleep apnea (adult) (pediatric): Secondary | ICD-10-CM | POA: Diagnosis not present

## 2022-09-06 ENCOUNTER — Encounter: Payer: Self-pay | Admitting: Family Medicine

## 2022-09-06 ENCOUNTER — Ambulatory Visit (INDEPENDENT_AMBULATORY_CARE_PROVIDER_SITE_OTHER): Payer: Medicare Other | Admitting: Family Medicine

## 2022-09-06 VITALS — BP 138/88 | HR 81 | Temp 98.3°F | Wt 219.0 lb

## 2022-09-06 DIAGNOSIS — N6452 Nipple discharge: Secondary | ICD-10-CM | POA: Diagnosis not present

## 2022-09-06 DIAGNOSIS — I129 Hypertensive chronic kidney disease with stage 1 through stage 4 chronic kidney disease, or unspecified chronic kidney disease: Secondary | ICD-10-CM | POA: Diagnosis not present

## 2022-09-06 DIAGNOSIS — N6002 Solitary cyst of left breast: Secondary | ICD-10-CM | POA: Diagnosis not present

## 2022-09-06 NOTE — Progress Notes (Signed)
Subjective:    Patient ID: Jocelyn Ford, female    DOB: 04/05/1951, 71 y.o.   MRN: [id number]  Chief Complaint  Patient presents with   Breast Problem    Left nipple draining clear fluid. Had cyst in left breast. Noticed drainage Friday. Cyst went down and away, not sure if that is the cause.      HPI Patient was seen today for acute concern.  Patient endorses clear fluid draining from left nipple.  Notes mild soreness medial lower left breast.  Patient denies injury, changes in medication or products, erythema, edema.  Patient states she was in housecleaning when she felt something moist against her skin.  Patient was able to express clear fluid from nipple.  Lasted 1 day.  Denies seeing blood.  Mammogram done 12/29/2021.    Patient last seen 08/09/2022 for cyst in left axilla.  Patient states that area has healed and there are no bumps in armpit.  Patient concerned that the cyst may have caused the drainage.  Past Medical History:  Diagnosis Date   Acute gouty arthropathy    Allergic rhinitis, cause unspecified    Arthritis    Chronic kidney disease    Dizziness and giddiness    Dysuria    Esophageal reflux    Heart murmur    Iron deficiency anemia, unspecified    Nonspecific abnormal electrocardiogram (ECG) (EKG)    Osteoporosis, unspecified    Pure hypercholesterolemia    Routine general medical examination at a health care facility    Unspecified essential hypertension     Allergies  Allergen Reactions   Doxycycline    Nifedipine     REACTION: Nausea,weakness    ROS General: Denies fever, chills, night sweats, changes in weight, changes in appetite HEENT: Denies headaches, ear pain, changes in vision, rhinorrhea, sore throat CV: Denies CP, palpitations, SOB, orthopnea Pulm: Denies SOB, cough, wheezing GI: Denies abdominal pain, nausea, vomiting, diarrhea, constipation GU: Denies dysuria, hematuria, frequency, vaginal discharge  + d/c from L nipple  Msk: Denies  muscle cramps, joint pains Neuro: Denies weakness, numbness, tingling Skin: Denies rashes, bruising Psych: Denies depression, anxiety, hallucinations      Objective:    Blood pressure 138/88, pulse 81, temperature 98.3 F (36.8 C), temperature source Oral, weight 219 lb (99.3 kg), SpO2 99 %.  Gen. Pleasant, well-nourished, in no distress, normal affect   HEENT: /AT, face symmetric, conjunctiva clear, no scleral icterus, PERRLA, EOMI, nares patent without drainage Lungs: no accessory muscle use Cardiovascular: RRR, no peripheral edema GU: normal appearing large breasts.  81m round cystic like structure in L breast RLQ midline ~8 o'clock position.  Clear d/c expressed from L nipple, hemoccult negative. Neuro:  A&Ox3, CN II-XII intact, normal gait Skin:  Warm, no lesions/ rash   Wt Readings from Last 3 Encounters:  09/06/22 219 lb (99.3 kg)  08/09/22 217 lb 9.6 oz (98.7 kg)  07/12/22 221 lb 6.4 oz (100.4 kg)    Lab Results  Component Value Date   WBC 5.5 10/23/2021   HGB 12.3 10/23/2021   HCT 37.7 10/23/2021   PLT 321 10/23/2021   GLUCOSE 103 (H) 06/23/2022   CHOL 205 (H) 02/13/2020   TRIG 111.0 02/13/2020   HDL 37.10 (L) 02/13/2020   LDLDIRECT 194.6 08/21/2013   LDLCALC 146 (H) 02/13/2020   ALT 12 10/23/2021   AST 19 10/23/2021   NA 139 06/23/2022   K 4.0 06/23/2022   CL 104 06/23/2022   CREATININE 1.47 (H)  06/23/2022   BUN 17 06/23/2022   CO2 27 06/23/2022   TSH 1.43 10/21/2021   INR 1.1 (H) 01/22/2015   HGBA1C 6.2 06/23/2022    Assessment/Plan:  Nipple discharge -clear drainage expressed in clinic.  Hemoccult negative. -We will proceed with imaging -Given precautions  - Plan: US BREAST BILATERAL, MM Digital Diagnostic Unilat L  Cyst of left breast  - Plan: US BREAST BILATERAL, MM Digital Diagnostic Unilat L  Hypertension with renal disease -Stage III CKD -Controlled -Continue current medications including atenolol 25 mg twice daily and losartan 100  mg daily -Continue lifestyle modifications -Continue monitoring BP at home -Continue follow-up with nephrology  F/u prn  Grier Mitts, MD

## 2022-09-06 NOTE — Patient Instructions (Signed)
A referral to have imaging done was placed.  You will receive a phone call about scheduling this appointment.

## 2022-09-13 ENCOUNTER — Other Ambulatory Visit: Payer: Self-pay | Admitting: Family Medicine

## 2022-09-13 DIAGNOSIS — N6002 Solitary cyst of left breast: Secondary | ICD-10-CM

## 2022-09-13 DIAGNOSIS — N6452 Nipple discharge: Secondary | ICD-10-CM

## 2022-09-19 ENCOUNTER — Other Ambulatory Visit: Payer: Self-pay | Admitting: Family Medicine

## 2022-09-19 DIAGNOSIS — I129 Hypertensive chronic kidney disease with stage 1 through stage 4 chronic kidney disease, or unspecified chronic kidney disease: Secondary | ICD-10-CM

## 2022-09-23 DIAGNOSIS — H02885 Meibomian gland dysfunction left lower eyelid: Secondary | ICD-10-CM | POA: Diagnosis not present

## 2022-09-23 DIAGNOSIS — H0102A Squamous blepharitis right eye, upper and lower eyelids: Secondary | ICD-10-CM | POA: Diagnosis not present

## 2022-09-23 DIAGNOSIS — Z961 Presence of intraocular lens: Secondary | ICD-10-CM | POA: Diagnosis not present

## 2022-09-23 DIAGNOSIS — H0102B Squamous blepharitis left eye, upper and lower eyelids: Secondary | ICD-10-CM | POA: Diagnosis not present

## 2022-09-28 DIAGNOSIS — G4733 Obstructive sleep apnea (adult) (pediatric): Secondary | ICD-10-CM | POA: Diagnosis not present

## 2022-10-05 ENCOUNTER — Other Ambulatory Visit: Payer: Self-pay | Admitting: Family Medicine

## 2022-10-05 ENCOUNTER — Telehealth: Payer: Self-pay

## 2022-10-05 NOTE — Telephone Encounter (Signed)
Pt is calling in regards to left breast issue she did not go into detail but would like a call back [phone]

## 2022-10-06 ENCOUNTER — Other Ambulatory Visit: Payer: Self-pay | Admitting: Family Medicine

## 2022-10-06 DIAGNOSIS — J302 Other seasonal allergic rhinitis: Secondary | ICD-10-CM

## 2022-10-07 NOTE — Telephone Encounter (Signed)
Pt can take tylenol and use heat for current discomfort.  Ok to wait until diagnostic mammogram.

## 2022-10-07 NOTE — Telephone Encounter (Signed)
Spoke with pt. Is aware of message from PCP.

## 2022-10-25 ENCOUNTER — Other Ambulatory Visit: Payer: Medicare Other

## 2022-10-27 ENCOUNTER — Ambulatory Visit
Admission: RE | Admit: 2022-10-27 | Discharge: 2022-10-27 | Disposition: A | Discharge status: Home / Self Care | Payer: Medicare Other | Source: Ambulatory Visit | Attending: Family Medicine | Admitting: Family Medicine

## 2022-10-27 ENCOUNTER — Other Ambulatory Visit: Payer: Self-pay | Admitting: Family Medicine

## 2022-10-27 DIAGNOSIS — N6452 Nipple discharge: Secondary | ICD-10-CM

## 2022-10-27 DIAGNOSIS — D369 Benign neoplasm, unspecified site: Secondary | ICD-10-CM

## 2022-10-27 DIAGNOSIS — N6002 Solitary cyst of left breast: Secondary | ICD-10-CM

## 2022-10-29 DIAGNOSIS — G4733 Obstructive sleep apnea (adult) (pediatric): Secondary | ICD-10-CM | POA: Diagnosis not present

## 2022-11-04 ENCOUNTER — Other Ambulatory Visit: Payer: Self-pay | Admitting: Diagnostic Radiology

## 2022-11-04 ENCOUNTER — Ambulatory Visit
Admission: RE | Admit: 2022-11-04 | Discharge: 2022-11-04 | Disposition: A | Discharge status: Home / Self Care | Payer: Medicare Other | Source: Ambulatory Visit | Attending: Family Medicine | Admitting: Family Medicine

## 2022-11-04 DIAGNOSIS — N6002 Solitary cyst of left breast: Secondary | ICD-10-CM | POA: Diagnosis not present

## 2022-11-04 DIAGNOSIS — D369 Benign neoplasm, unspecified site: Secondary | ICD-10-CM

## 2022-11-04 DIAGNOSIS — R928 Other abnormal and inconclusive findings on diagnostic imaging of breast: Secondary | ICD-10-CM | POA: Diagnosis not present

## 2022-11-04 HISTORY — PX: BREAST BIOPSY: SHX20

## 2022-11-15 ENCOUNTER — Telehealth: Payer: Self-pay | Admitting: Pulmonary Disease

## 2022-11-15 NOTE — Telephone Encounter (Signed)
Called and spoke with pt who states she had a breast biopsy performed and states that they placed a clip implant in her left breast. Pt wants to know if this will impact the CT scan that she has scheduled this Wednesday.  Beth, please advise on this for pt.

## 2022-11-15 NOTE — Telephone Encounter (Signed)
Called and spoke with pt letting her know that Eustaquio Maize said the clip will not impact the CT and she verbalized understanding. Nothing further needed.

## 2022-11-15 NOTE — Telephone Encounter (Signed)
Nope.

## 2022-11-17 ENCOUNTER — Ambulatory Visit (HOSPITAL_BASED_OUTPATIENT_CLINIC_OR_DEPARTMENT_OTHER)
Admission: RE | Admit: 2022-11-17 | Discharge: 2022-11-17 | Disposition: A | Discharge status: Home / Self Care | Payer: Medicare Other | Source: Ambulatory Visit | Attending: Pulmonary Disease | Admitting: Pulmonary Disease

## 2022-11-17 DIAGNOSIS — R911 Solitary pulmonary nodule: Secondary | ICD-10-CM | POA: Diagnosis not present

## 2022-11-19 ENCOUNTER — Other Ambulatory Visit: Payer: Self-pay | Admitting: Family Medicine

## 2022-11-19 DIAGNOSIS — N6452 Nipple discharge: Secondary | ICD-10-CM

## 2022-11-22 NOTE — Addendum Note (Signed)
Addended by: Encarnacion Slates on: 11/22/2022 12:37 PM   Modules accepted: Orders

## 2022-11-25 ENCOUNTER — Telehealth (HOSPITAL_BASED_OUTPATIENT_CLINIC_OR_DEPARTMENT_OTHER): Payer: Self-pay

## 2022-11-25 ENCOUNTER — Telehealth: Payer: Self-pay | Admitting: Primary Care

## 2022-11-25 NOTE — Telephone Encounter (Signed)
-----   Message from Rigoberto Noel, MD sent at 11/18/2022  5:08 PM EST ----- Nodule has calcium in it now which suggest that it was an area of inflammation and is now healing

## 2022-11-25 NOTE — Telephone Encounter (Signed)
Called Pt with results of Ct Scan. Pt stated understanding and nothing further needed.

## 2022-11-28 ENCOUNTER — Other Ambulatory Visit: Payer: Self-pay | Admitting: Family Medicine

## 2022-11-28 DIAGNOSIS — Z760 Encounter for issue of repeat prescription: Secondary | ICD-10-CM

## 2022-11-28 DIAGNOSIS — G4733 Obstructive sleep apnea (adult) (pediatric): Secondary | ICD-10-CM | POA: Diagnosis not present

## 2022-12-04 ENCOUNTER — Ambulatory Visit
Admission: RE | Admit: 2022-12-04 | Discharge: 2022-12-04 | Disposition: A | Discharge status: Home / Self Care | Payer: Medicare Other | Source: Ambulatory Visit | Attending: Family Medicine | Admitting: Family Medicine

## 2022-12-04 DIAGNOSIS — N6452 Nipple discharge: Secondary | ICD-10-CM

## 2022-12-04 MED ORDER — GADOPICLENOL 0.5 MMOL/ML IV SOLN
10.0000 mL | Freq: Once | INTRAVENOUS | Status: AC | PRN
  Administered 2022-12-04: 10 mL via INTRAVENOUS

## 2022-12-06 ENCOUNTER — Other Ambulatory Visit: Payer: Self-pay | Admitting: Family Medicine

## 2022-12-06 DIAGNOSIS — R9389 Abnormal findings on diagnostic imaging of other specified body structures: Secondary | ICD-10-CM

## 2022-12-08 DIAGNOSIS — H1045 Other chronic allergic conjunctivitis: Secondary | ICD-10-CM | POA: Diagnosis not present

## 2022-12-10 ENCOUNTER — Ambulatory Visit
Admission: RE | Admit: 2022-12-10 | Discharge: 2022-12-10 | Disposition: A | Discharge status: Home / Self Care | Payer: Medicare Other | Source: Ambulatory Visit | Attending: Family Medicine | Admitting: Family Medicine

## 2022-12-10 DIAGNOSIS — R9389 Abnormal findings on diagnostic imaging of other specified body structures: Secondary | ICD-10-CM

## 2022-12-10 DIAGNOSIS — D241 Benign neoplasm of right breast: Secondary | ICD-10-CM | POA: Diagnosis not present

## 2022-12-10 DIAGNOSIS — N6011 Diffuse cystic mastopathy of right breast: Secondary | ICD-10-CM | POA: Diagnosis not present

## 2022-12-10 DIAGNOSIS — N6312 Unspecified lump in the right breast, upper inner quadrant: Secondary | ICD-10-CM | POA: Diagnosis not present

## 2022-12-10 MED ORDER — GADOPICLENOL 0.5 MMOL/ML IV SOLN: 10.0000 mL | Freq: Once | INTRAVENOUS | Status: DC | PRN

## 2022-12-19 ENCOUNTER — Other Ambulatory Visit: Payer: Self-pay | Admitting: Family Medicine

## 2022-12-27 ENCOUNTER — Ambulatory Visit: Payer: Self-pay | Admitting: Surgery

## 2022-12-27 DIAGNOSIS — N6321 Unspecified lump in the left breast, upper outer quadrant: Secondary | ICD-10-CM

## 2022-12-27 DIAGNOSIS — N6452 Nipple discharge: Secondary | ICD-10-CM | POA: Diagnosis not present

## 2022-12-27 DIAGNOSIS — N6342 Unspecified lump in left breast, subareolar: Secondary | ICD-10-CM | POA: Diagnosis not present

## 2022-12-27 DIAGNOSIS — D241 Benign neoplasm of right breast: Secondary | ICD-10-CM | POA: Diagnosis not present

## 2022-12-29 ENCOUNTER — Encounter: Payer: Self-pay | Admitting: *Deleted

## 2022-12-29 ENCOUNTER — Telehealth: Payer: Self-pay | Admitting: *Deleted

## 2022-12-29 NOTE — Patient Instructions (Signed)
Visit Information  Thank you for taking time to visit with me today. Please don't hesitate to contact me if I can be of assistance to you.   Following are the goals we discussed today:   Goals Addressed               This Visit's Progress     COMPLETED: Requested refill on medications (Allopurinol) (pt-stated)        Care Coordination Interventions: Reviewed medications with patient and discussed adherence with all medications however pt requesting a refill on Allopurinol and has enough for the next few days. Collaborated with Dr. Volanda Napoleon (in-basket) regarding the requested medications Allopurinol. Reviewed scheduled/upcoming provider appointments including sufficient transportation source Screening for signs and symptoms of depression related to chronic disease state  Assessed social determinant of health barriers Educated on care management services related to social workers, pharmacy and a Designer, jewellery. Pt reports no immediate needs for managing her health at  this time.         Please call the care guide team at [phone] if you need to cancel or reschedule your appointment.   If you are experiencing a Mental Health or [place] or need someone to talk to, please call the Suicide and Crisis Lifeline: 988  Patient verbalizes understanding of instructions and care plan provided today and agrees to view in [place]. Active MyChart status and patient understanding of how to access instructions and care plan via MyChart confirmed with patient.     No further follow up required: No additional follow up needs at this time.  Raina Mina, RN Care Management Coordinator [place] Office [phone]

## 2022-12-29 NOTE — Patient Outreach (Signed)
  Care Coordination   Initial Visit Note   12/29/2022 Name: Jocelyn Ford MRN: [id number] DOB: [birth year]  Jocelyn Ford is a 72 y.o. year old female who sees Jocelyn Ruddy, MD for primary care. I spoke with  Jocelyn Ford by phone today.  What matters to the patients health and wellness today?  Refill requested on medication Allopurinol    Goals Addressed               This Visit's Progress     COMPLETED: Requested refill on medications (Allopurinol) (pt-stated)        Care Coordination Interventions: Reviewed medications with patient and discussed adherence with all medications however pt requesting a refill on Allopurinol and has enough for the next few days. Collaborated with Dr. Volanda Ford (in-basket) regarding the requested medications Allopurinol. Reviewed scheduled/upcoming provider appointments including sufficient transportation source Screening for signs and symptoms of depression related to chronic disease state  Assessed social determinant of health barriers Educated on care management services related to social workers, pharmacy and a Designer, jewellery. Pt reports no immediate needs for managing her health at  this time.         SDOH assessments and interventions completed:  Yes  SDOH Interventions Today    Flowsheet Row Most Recent Value  SDOH Interventions   Food Insecurity Interventions Intervention Not Indicated  Housing Interventions Intervention Not Indicated  Transportation Interventions Intervention Not Indicated  Utilities Interventions Intervention Not Indicated        Care Coordination Interventions:  Yes, provided   Follow up plan: No further intervention required.   Encounter Outcome:  Pt. Visit Completed   Jocelyn Mina, RN Care Management Coordinator [place] Office [phone]

## 2023-01-04 ENCOUNTER — Other Ambulatory Visit: Payer: Self-pay | Admitting: Surgery

## 2023-01-04 DIAGNOSIS — N6321 Unspecified lump in the left breast, upper outer quadrant: Secondary | ICD-10-CM

## 2023-01-12 ENCOUNTER — Other Ambulatory Visit: Payer: Self-pay | Admitting: Family Medicine

## 2023-01-12 ENCOUNTER — Telehealth: Payer: Self-pay | Admitting: Family Medicine

## 2023-01-12 NOTE — Telephone Encounter (Signed)
Pt is calling and was seen on 07-24-2022 and would like a refill on allopurinol (ZYLOPRIM) 100 MG tablet  [id number] [place]  [facility] [address] Phone: [phone] Fax: [phone]

## 2023-01-19 NOTE — Telephone Encounter (Signed)
This medication was not previously prescribed by this provider.  Will have patient set up appointment to see if she is having a gout flare.  If patient is not having frequent gout flares does not need to restart.

## 2023-01-20 NOTE — Telephone Encounter (Signed)
Attempt to reach pt. Left a voicemail to call us back.  

## 2023-01-21 NOTE — Telephone Encounter (Signed)
Inform pt of Dr. Volanda Napoleon' advise below.  Pt reports she pick up a refill on 01/12/2023.  Pt denied of having gout flares. And states she has been taking the medication daily since seeing Dr. Volanda Napoleon.   Since she is not having gout flares up, pt question if she need to stop taking the medication or okay to continue.   Please advise.

## 2023-01-29 ENCOUNTER — Other Ambulatory Visit: Payer: Self-pay | Admitting: Family Medicine

## 2023-01-29 DIAGNOSIS — I10 Essential (primary) hypertension: Secondary | ICD-10-CM

## 2023-01-31 NOTE — Telephone Encounter (Signed)
Have patient schedule an appointment.

## 2023-02-02 ENCOUNTER — Encounter (HOSPITAL_BASED_OUTPATIENT_CLINIC_OR_DEPARTMENT_OTHER): Payer: Self-pay | Admitting: Surgery

## 2023-02-02 ENCOUNTER — Other Ambulatory Visit: Payer: Self-pay

## 2023-02-02 NOTE — Telephone Encounter (Signed)
Spoke to pt. She reports she had stop taking the medication as she had thought she was told to. Offer OV, pt declined as she is not feeling well and will have breast surgery on 02/09/23.   Scheduled a VV with Dr. Volanda Napoleon tomorrow.

## 2023-02-03 ENCOUNTER — Encounter: Payer: Self-pay | Admitting: Family Medicine

## 2023-02-03 ENCOUNTER — Telehealth: Payer: Self-pay | Admitting: Family Medicine

## 2023-02-03 ENCOUNTER — Telehealth (INDEPENDENT_AMBULATORY_CARE_PROVIDER_SITE_OTHER): Payer: Medicare Other | Admitting: Family Medicine

## 2023-02-03 VITALS — Ht 67.0 in | Wt 223.0 lb

## 2023-02-03 DIAGNOSIS — D241 Benign neoplasm of right breast: Secondary | ICD-10-CM | POA: Diagnosis not present

## 2023-02-03 DIAGNOSIS — N6321 Unspecified lump in the left breast, upper outer quadrant: Secondary | ICD-10-CM | POA: Diagnosis not present

## 2023-02-03 DIAGNOSIS — Z8739 Personal history of other diseases of the musculoskeletal system and connective tissue: Secondary | ICD-10-CM

## 2023-02-03 DIAGNOSIS — N1832 Chronic kidney disease, stage 3b: Secondary | ICD-10-CM

## 2023-02-03 NOTE — Telephone Encounter (Signed)
Patient returned call

## 2023-02-03 NOTE — Telephone Encounter (Signed)
Contacted patient in regards to virtual visit with PCP today

## 2023-02-03 NOTE — Progress Notes (Signed)
Virtual Visit via Video Note  I connected with Jocelyn Ford on 02/03/23 at 11:30 AM EST by a video enabled telemedicine application 2/2 [id number] pandemic and verified that I am speaking with the correct person using two identifiers.  Location patient: home Location provider:work or home office Persons participating in the virtual visit: patient, provider  I discussed the limitations of evaluation and management by telemedicine and the availability of in person appointments. The patient expressed understanding and agreed to proceed.  Chief Complaint  Patient presents with   Medication Consultation   Gout    HPI: Pt is a 72 yo female with pmh sig for HTN, CKD 3, osteoporosis, OSA hyperparathyroidism, GERD, iron deficiency anemia, OA. Gout, insomnia, pulm nodule<32m who is seen for f/u.     Pt requested refill on allopurinol last month, however medication in chart appeared to have last been filled several yrs ago.    Pt states she has been on allopurinol x yrs prior to establishing with this provider.  Started while she was still working due to frequent gout flares.  Pt states she was able to get the prescription from the pharmacy: allopurinol 100 mg  take 2 tabs  180 tabs with 3 refills, prescribed by R. BOwens Shark   Patient states she does not provider is.  Patient celebrated her birthday 2 days ago.  Patient somewhat nervous about upcoming surgery for bilateral lumpectomy.  Surgery planned 02/09/2023.  ROS: See pertinent positives and negatives per HPI.  Past Medical History:  Diagnosis Date   Acute gouty arthropathy    Allergic rhinitis, cause unspecified    Arthritis    Chronic kidney disease    Complication of anesthesia    hard time waking up   Dizziness and giddiness    Dysuria    Esophageal reflux    Heart murmur    Iron deficiency anemia, unspecified    Nonspecific abnormal electrocardiogram (ECG) (EKG)    Osteoporosis, unspecified    Pure hypercholesterolemia     Routine general medical examination at a health care facility    Unspecified essential hypertension     Past Surgical History:  Procedure Laterality Date   ABDOMINAL HYSTERECTOMY     BREAST BIOPSY Left 11/04/2022   [place] BREAST BX W LOC DEV 1ST LESION IMG BX SPEC [place] 11/04/2022 [facility]   LEFT HEART CATHETERIZATION WITH CORONARY ANGIOGRAM N/A 01/28/2015   Procedure: LEFT HEART CATHETERIZATION WITH CORONARY ANGIOGRAM;  Surgeon: MCandee Furbish MD;  [facility];  Service: Cardiovascular;  Laterality: N/A;    Family History  Problem Relation Age of Onset   Alzheimer's disease Mother    Hypertension Mother    Kidney disease Mother    Cancer Father        Prostate   Hypertension Father    Diabetes Sister    Hypertension Sister    Hypertension Sister    Breast cancer Sister 71  Healthy Daughter    Heart Problems Brother        stent   Hypertension Brother     Current Outpatient Medications:    allopurinol (ZYLOPRIM) 100 MG tablet, Take 200 mg by mouth daily., Disp: , Rfl:    aspirin-acetaminophen-caffeine (EXCEDRIN MIGRAINE) 250-250-65 MG tablet, Take 2 tablets by mouth every 6 (six) hours as needed for headache or migraine., Disp: , Rfl:    atenolol (TENORMIN) 25 MG tablet, TAKE 1 TABLET BY MOUTH TWICE A DAY, Disp: 180 tablet, Rfl: 1   cholecalciferol (VITAMIN  D) 25 MCG (1000 UNIT) tablet, Take 1,000 Units by mouth daily., Disp: , Rfl:    FERROUS SULFATE PO, Take 1 tablet by mouth daily., Disp: , Rfl:    fluticasone (FLONASE) 50 MCG/ACT nasal spray, PLACE 1 SPRAY INTO BOTH NOSTRILS DAILY AS NEEDED FOR ALLERGIES., Disp: 48 mL, Rfl: 1   furosemide (LASIX) 40 MG tablet, TAKE 1 TABLET BY MOUTH DAILY, IF INCREASED EDEMA, MAY TAKE ADDITIONAL 1/2 TAB (20 MG ) FOR THAT DAY. PLEASE NOTIFY OFFICE IF INCREASED EDEMA PERSISTS., Disp: 90 tablet, Rfl: 3   losartan (COZAAR) 100 MG tablet, TAKE 1 TABLET BY MOUTH EVERY DAY, Disp: 90 tablet, Rfl: 1   potassium chloride SA  (KLOR-CON M20) 20 MEQ tablet, TAKE 3 TABLETS BY MOUTH 3 TIMES DAILY. -INS WILL ONLY PAY FOR 5 A DAY, Disp: 450 tablet, Rfl: 2  EXAM:  VITALS per patient if applicable: RR between 123456 bpm  GENERAL: alert, oriented, appears well and in no acute distress  HEENT: atraumatic, conjunctiva clear, no obvious abnormalities on inspection of external nose and ears  NECK: normal movements of the head and neck  LUNGS: on inspection no signs of respiratory distress, breathing rate appears normal, no obvious gross SOB, gasping or wheezing  CV: no obvious cyanosis  MS: moves all visible extremities without noticeable abnormality  PSYCH/NEURO: pleasant and cooperative, no obvious depression or anxiety, speech and thought processing grossly intact  ASSESSMENT AND PLAN:  Discussed the following assessment and plan:  Mass of upper outer quadrant of left breast -Continue follow-up with general surgery for bilateral breast lumpectomy with radioactive seed localization planned 02/09/2023  Fibroadenoma of right breast  History of gout -Patient advised okay to restart allopurinol 200 mg daily. -Continue to avoid foods known to cause symptoms. -Continue hydration. -It wishes to do a trail off med, will wait until after upcoming surgery.  Stage 3b chronic kidney disease (HCC) -CKD stage IIIb stable.  eGFR 40 and creatinine 1.42 on 08/04/22 with Nephrology.   - okay to continue allopurinol 200 mg daily.   -Patient advised per chart review: medication has not been filled since 2021 by historical provider.  Recent refill done by R. Owens Shark, appears to be a NP at nephrology.   -continue to monitor renal function.  If eGFR becomes less than 30 will need to change to every other day dosing.  F/u prn or for continued or worsening  I discussed the assessment and treatment plan with the patient. The patient was provided an opportunity to ask questions and all were answered. The patient agreed with the plan and  demonstrated an understanding of the instructions.   The patient was advised to call back or seek an in-person evaluation if the symptoms worsen or if the condition fails to improve as anticipated.   [name], MD

## 2023-02-07 ENCOUNTER — Encounter (HOSPITAL_BASED_OUTPATIENT_CLINIC_OR_DEPARTMENT_OTHER)
Admission: RE | Admit: 2023-02-07 | Discharge: 2023-02-07 | Disposition: A | Discharge status: Home / Self Care | Payer: Medicare Other | Source: Ambulatory Visit | Attending: Surgery | Admitting: Surgery

## 2023-02-07 DIAGNOSIS — Z79899 Other long term (current) drug therapy: Secondary | ICD-10-CM | POA: Insufficient documentation

## 2023-02-07 DIAGNOSIS — Z01818 Encounter for other preprocedural examination: Secondary | ICD-10-CM | POA: Diagnosis not present

## 2023-02-07 LAB — BASIC METABOLIC PANEL
Anion gap: 9 (ref 5–15)
BUN: 13 mg/dL (ref 8–23)
CO2: 25 mmol/L (ref 22–32)
Calcium: 9.2 mg/dL (ref 8.9–10.3)
Chloride: 105 mmol/L (ref 98–111)
Creatinine, Ser: 1.49 mg/dL — ABNORMAL HIGH (ref 0.44–1.00)
GFR, Estimated: 38 mL/min — ABNORMAL LOW (ref >60)
Glucose, Bld: 96 mg/dL (ref 70–99)
Potassium: 4 mmol/L (ref 3.5–5.1)
Sodium: 139 mmol/L (ref 135–145)

## 2023-02-07 NOTE — Progress Notes (Signed)

## 2023-02-08 ENCOUNTER — Ambulatory Visit
Admission: RE | Admit: 2023-02-08 | Discharge: 2023-02-08 | Disposition: A | Discharge status: Home / Self Care | Payer: Medicare Other | Source: Ambulatory Visit | Attending: Surgery | Admitting: Surgery

## 2023-02-08 ENCOUNTER — Ambulatory Visit: Payer: Self-pay | Admitting: Surgery

## 2023-02-08 DIAGNOSIS — N6321 Unspecified lump in the left breast, upper outer quadrant: Secondary | ICD-10-CM

## 2023-02-08 DIAGNOSIS — R928 Other abnormal and inconclusive findings on diagnostic imaging of breast: Secondary | ICD-10-CM | POA: Diagnosis not present

## 2023-02-08 DIAGNOSIS — N63 Unspecified lump in unspecified breast: Secondary | ICD-10-CM

## 2023-02-08 HISTORY — PX: BREAST BIOPSY: SHX20

## 2023-02-09 ENCOUNTER — Other Ambulatory Visit: Payer: Self-pay

## 2023-02-09 ENCOUNTER — Encounter (HOSPITAL_BASED_OUTPATIENT_CLINIC_OR_DEPARTMENT_OTHER): Payer: Self-pay | Admitting: Surgery

## 2023-02-09 ENCOUNTER — Encounter (HOSPITAL_BASED_OUTPATIENT_CLINIC_OR_DEPARTMENT_OTHER)
Admission: RE | Disposition: A | Discharge status: Home / Self Care | Payer: Self-pay | Source: Ambulatory Visit | Attending: Surgery

## 2023-02-09 ENCOUNTER — Ambulatory Visit (HOSPITAL_BASED_OUTPATIENT_CLINIC_OR_DEPARTMENT_OTHER): Payer: Medicare Other | Admitting: Certified Registered"

## 2023-02-09 ENCOUNTER — Ambulatory Visit
Admission: RE | Admit: 2023-02-09 | Discharge: 2023-02-09 | Disposition: A | Discharge status: Home / Self Care | Payer: Medicare Other | Source: Ambulatory Visit | Attending: Surgery | Admitting: Surgery

## 2023-02-09 ENCOUNTER — Ambulatory Visit (HOSPITAL_BASED_OUTPATIENT_CLINIC_OR_DEPARTMENT_OTHER)
Admission: RE | Admit: 2023-02-09 | Discharge: 2023-02-09 | Disposition: A | Discharge status: Home / Self Care | Payer: Medicare Other | Source: Ambulatory Visit | Attending: Surgery | Admitting: Surgery

## 2023-02-09 DIAGNOSIS — N6012 Diffuse cystic mastopathy of left breast: Secondary | ICD-10-CM | POA: Insufficient documentation

## 2023-02-09 DIAGNOSIS — N6452 Nipple discharge: Secondary | ICD-10-CM

## 2023-02-09 DIAGNOSIS — D242 Benign neoplasm of left breast: Secondary | ICD-10-CM | POA: Diagnosis not present

## 2023-02-09 DIAGNOSIS — Z87891 Personal history of nicotine dependence: Secondary | ICD-10-CM | POA: Insufficient documentation

## 2023-02-09 DIAGNOSIS — I129 Hypertensive chronic kidney disease with stage 1 through stage 4 chronic kidney disease, or unspecified chronic kidney disease: Secondary | ICD-10-CM | POA: Insufficient documentation

## 2023-02-09 DIAGNOSIS — N6022 Fibroadenosis of left breast: Secondary | ICD-10-CM | POA: Diagnosis not present

## 2023-02-09 DIAGNOSIS — I10 Essential (primary) hypertension: Secondary | ICD-10-CM

## 2023-02-09 DIAGNOSIS — N189 Chronic kidney disease, unspecified: Secondary | ICD-10-CM

## 2023-02-09 DIAGNOSIS — Z803 Family history of malignant neoplasm of breast: Secondary | ICD-10-CM | POA: Insufficient documentation

## 2023-02-09 DIAGNOSIS — N6002 Solitary cyst of left breast: Secondary | ICD-10-CM | POA: Diagnosis not present

## 2023-02-09 DIAGNOSIS — D241 Benign neoplasm of right breast: Secondary | ICD-10-CM | POA: Insufficient documentation

## 2023-02-09 DIAGNOSIS — N6011 Diffuse cystic mastopathy of right breast: Secondary | ICD-10-CM | POA: Diagnosis not present

## 2023-02-09 DIAGNOSIS — N6021 Fibroadenosis of right breast: Secondary | ICD-10-CM | POA: Diagnosis not present

## 2023-02-09 DIAGNOSIS — R928 Other abnormal and inconclusive findings on diagnostic imaging of breast: Secondary | ICD-10-CM | POA: Diagnosis not present

## 2023-02-09 DIAGNOSIS — N6342 Unspecified lump in left breast, subareolar: Secondary | ICD-10-CM | POA: Insufficient documentation

## 2023-02-09 DIAGNOSIS — N6081 Other benign mammary dysplasias of right breast: Secondary | ICD-10-CM | POA: Insufficient documentation

## 2023-02-09 DIAGNOSIS — Z79899 Other long term (current) drug therapy: Secondary | ICD-10-CM | POA: Insufficient documentation

## 2023-02-09 DIAGNOSIS — R920 Mammographic microcalcification found on diagnostic imaging of breast: Secondary | ICD-10-CM | POA: Diagnosis not present

## 2023-02-09 DIAGNOSIS — N6041 Mammary duct ectasia of right breast: Secondary | ICD-10-CM | POA: Diagnosis not present

## 2023-02-09 DIAGNOSIS — Z09 Encounter for follow-up examination after completed treatment for conditions other than malignant neoplasm: Secondary | ICD-10-CM | POA: Diagnosis not present

## 2023-02-09 DIAGNOSIS — Z01818 Encounter for other preprocedural examination: Secondary | ICD-10-CM

## 2023-02-09 DIAGNOSIS — N6321 Unspecified lump in the left breast, upper outer quadrant: Secondary | ICD-10-CM

## 2023-02-09 HISTORY — DX: Other complications of anesthesia, initial encounter: T88.59XA

## 2023-02-09 HISTORY — PX: BREAST LUMPECTOMY WITH RADIOACTIVE SEED LOCALIZATION: SHX6424

## 2023-02-09 SURGERY — BREAST LUMPECTOMY WITH RADIOACTIVE SEED LOCALIZATION
Anesthesia: General | Site: Breast | Laterality: Bilateral

## 2023-02-09 MED ORDER — CHLORHEXIDINE GLUCONATE CLOTH 2 % EX PADS: 6.0000 | MEDICATED_PAD | Freq: Once | CUTANEOUS | Status: DC

## 2023-02-09 MED ORDER — ONDANSETRON 4 MG PO TBDP
ORAL_TABLET | ORAL | Status: AC
  Filled 2023-02-09: qty 1

## 2023-02-09 MED ORDER — SODIUM CHLORIDE 0.9 % IV SOLN
INTRAVENOUS | Status: DC | PRN
  Administered 2023-02-09: 500 mL

## 2023-02-09 MED ORDER — EPHEDRINE 5 MG/ML INJ
INTRAVENOUS | Status: AC
  Filled 2023-02-09: qty 5

## 2023-02-09 MED ORDER — KETOROLAC TROMETHAMINE 15 MG/ML IJ SOLN: 15.0000 mg | Freq: Once | INTRAMUSCULAR | Status: DC | PRN

## 2023-02-09 MED ORDER — EPHEDRINE SULFATE (PRESSORS) 50 MG/ML IJ SOLN
INTRAMUSCULAR | Status: DC | PRN
  Administered 2023-02-09: 10 mg via INTRAVENOUS

## 2023-02-09 MED ORDER — FENTANYL CITRATE (PF) 100 MCG/2ML IJ SOLN: 25.0000 ug | INTRAMUSCULAR | Status: DC | PRN

## 2023-02-09 MED ORDER — AMISULPRIDE (ANTIEMETIC) 5 MG/2ML IV SOLN
10.0000 mg | Freq: Once | INTRAVENOUS | Status: AC | PRN
  Administered 2023-02-09: 10 mg via INTRAVENOUS

## 2023-02-09 MED ORDER — MIDAZOLAM HCL 2 MG/2ML IJ SOLN
INTRAMUSCULAR | Status: AC
  Filled 2023-02-09: qty 2

## 2023-02-09 MED ORDER — PROPOFOL 500 MG/50ML IV EMUL
INTRAVENOUS | Status: AC
  Filled 2023-02-09: qty 50

## 2023-02-09 MED ORDER — ACETAMINOPHEN 500 MG PO TABS
ORAL_TABLET | ORAL | Status: AC
  Filled 2023-02-09: qty 2

## 2023-02-09 MED ORDER — DEXAMETHASONE SODIUM PHOSPHATE 4 MG/ML IJ SOLN
INTRAMUSCULAR | Status: DC | PRN
  Administered 2023-02-09: 8 mg via INTRAVENOUS

## 2023-02-09 MED ORDER — ONDANSETRON 4 MG PO TBDP
4.0000 mg | ORAL_TABLET | Freq: Once | ORAL | Status: AC
  Administered 2023-02-09: 4 mg via ORAL

## 2023-02-09 MED ORDER — ONDANSETRON HCL 4 MG/2ML IJ SOLN: 4.0000 mg | Freq: Once | INTRAMUSCULAR | Status: DC | PRN

## 2023-02-09 MED ORDER — LIDOCAINE 2% (20 MG/ML) 5 ML SYRINGE
INTRAMUSCULAR | Status: AC
  Filled 2023-02-09: qty 5

## 2023-02-09 MED ORDER — SODIUM CHLORIDE 0.9 % IV SOLN
INTRAVENOUS | Status: AC
  Filled 2023-02-09: qty 10

## 2023-02-09 MED ORDER — CEFAZOLIN IN SODIUM CHLORIDE 3-0.9 GM/100ML-% IV SOLN
3.0000 g | INTRAVENOUS | Status: AC
  Administered 2023-02-09: 1 g via INTRAVENOUS
  Administered 2023-02-09: 2 g via INTRAVENOUS

## 2023-02-09 MED ORDER — BUPIVACAINE HCL (PF) 0.25 % IJ SOLN
INTRAMUSCULAR | Status: DC | PRN
  Administered 2023-02-09: 30 mL

## 2023-02-09 MED ORDER — LIDOCAINE HCL (CARDIAC) PF 100 MG/5ML IV SOSY
PREFILLED_SYRINGE | INTRAVENOUS | Status: DC | PRN
  Administered 2023-02-09: 60 mg via INTRAVENOUS

## 2023-02-09 MED ORDER — FENTANYL CITRATE (PF) 100 MCG/2ML IJ SOLN
INTRAMUSCULAR | Status: AC
  Filled 2023-02-09: qty 2

## 2023-02-09 MED ORDER — ONDANSETRON HCL 4 MG/2ML IJ SOLN
INTRAMUSCULAR | Status: AC
  Filled 2023-02-09: qty 2

## 2023-02-09 MED ORDER — PROPOFOL 10 MG/ML IV BOLUS
INTRAVENOUS | Status: DC | PRN
  Administered 2023-02-09: 160 mg via INTRAVENOUS

## 2023-02-09 MED ORDER — AMISULPRIDE (ANTIEMETIC) 5 MG/2ML IV SOLN
INTRAVENOUS | Status: AC
  Filled 2023-02-09: qty 4

## 2023-02-09 MED ORDER — BUPIVACAINE HCL (PF) 0.25 % IJ SOLN
INTRAMUSCULAR | Status: AC
  Filled 2023-02-09: qty 30

## 2023-02-09 MED ORDER — DEXAMETHASONE SODIUM PHOSPHATE 10 MG/ML IJ SOLN
INTRAMUSCULAR | Status: AC
  Filled 2023-02-09: qty 1

## 2023-02-09 MED ORDER — CEFAZOLIN SODIUM-DEXTROSE 2-4 GM/100ML-% IV SOLN
INTRAVENOUS | Status: AC
  Filled 2023-02-09: qty 100

## 2023-02-09 MED ORDER — FENTANYL CITRATE (PF) 100 MCG/2ML IJ SOLN
INTRAMUSCULAR | Status: DC | PRN
  Administered 2023-02-09 (×2): 50 ug via INTRAVENOUS

## 2023-02-09 MED ORDER — OXYCODONE HCL 5 MG PO TABS: 5.0000 mg | ORAL_TABLET | Freq: Four times a day (QID) | ORAL | 0 refills | Status: DC | PRN

## 2023-02-09 MED ORDER — MIDAZOLAM HCL 5 MG/5ML IJ SOLN
INTRAMUSCULAR | Status: DC | PRN
  Administered 2023-02-09: 2 mg via INTRAVENOUS

## 2023-02-09 MED ORDER — ONDANSETRON HCL 4 MG/2ML IJ SOLN
INTRAMUSCULAR | Status: DC | PRN
  Administered 2023-02-09: 4 mg via INTRAVENOUS

## 2023-02-09 MED ORDER — ACETAMINOPHEN 500 MG PO TABS
1000.0000 mg | ORAL_TABLET | ORAL | Status: AC
  Administered 2023-02-09: 1000 mg via ORAL

## 2023-02-09 MED ORDER — LACTATED RINGERS IV SOLN: INTRAVENOUS | Status: DC

## 2023-02-09 SURGICAL SUPPLY — 50 items
ADH SKN CLS APL DERMABOND .7 (GAUZE/BANDAGES/DRESSINGS) ×1
APL PRP STRL LF DISP 70% ISPRP (MISCELLANEOUS) ×2
APPLIER CLIP 9.375 MED OPEN (MISCELLANEOUS)
APR CLP MED 9.3 20 MLT OPN (MISCELLANEOUS)
BAG DECANTER FOR FLEXI CONT (MISCELLANEOUS) IMPLANT
BINDER BREAST LRG (GAUZE/BANDAGES/DRESSINGS) IMPLANT
BINDER BREAST MEDIUM (GAUZE/BANDAGES/DRESSINGS) IMPLANT
BINDER BREAST XLRG (GAUZE/BANDAGES/DRESSINGS) IMPLANT
BINDER BREAST XXLRG (GAUZE/BANDAGES/DRESSINGS) IMPLANT
BLADE SURG 15 STRL LF DISP TIS (BLADE) ×1 IMPLANT
BLADE SURG 15 STRL SS (BLADE) ×1
CANISTER SUC SOCK COL 7IN (MISCELLANEOUS) IMPLANT
CANISTER SUCT 1200ML W/VALVE (MISCELLANEOUS) IMPLANT
CHLORAPREP W/TINT 26 (MISCELLANEOUS) ×1 IMPLANT
CLIP APPLIE 9.375 MED OPEN (MISCELLANEOUS) IMPLANT
COVER BACK TABLE 60X90IN (DRAPES) ×1 IMPLANT
COVER MAYO STAND STRL (DRAPES) ×1 IMPLANT
COVER PROBE CYLINDRICAL 5X96 (MISCELLANEOUS) ×1 IMPLANT
DERMABOND ADVANCED .7 DNX12 (GAUZE/BANDAGES/DRESSINGS) ×1 IMPLANT
DRAPE LAPAROSCOPIC ABDOMINAL (DRAPES) IMPLANT
DRAPE LAPAROTOMY 100X72 PEDS (DRAPES) ×1 IMPLANT
DRAPE UTILITY XL STRL (DRAPES) ×1 IMPLANT
ELECT COATED BLADE 2.86 ST (ELECTRODE) ×1 IMPLANT
ELECT REM PT RETURN 9FT ADLT (ELECTROSURGICAL) ×1
ELECTRODE REM PT RTRN 9FT ADLT (ELECTROSURGICAL) ×1 IMPLANT
GLOVE BIOGEL PI IND STRL 8 (GLOVE) ×1 IMPLANT
GLOVE ECLIPSE 8.0 STRL XLNG CF (GLOVE) ×1 IMPLANT
GOWN STRL REUS W/ TWL LRG LVL3 (GOWN DISPOSABLE) ×2 IMPLANT
GOWN STRL REUS W/ TWL XL LVL3 (GOWN DISPOSABLE) ×1 IMPLANT
GOWN STRL REUS W/TWL LRG LVL3 (GOWN DISPOSABLE) ×2
GOWN STRL REUS W/TWL XL LVL3 (GOWN DISPOSABLE) ×1
HEMOSTAT ARISTA ABSORB 3G PWDR (HEMOSTASIS) IMPLANT
HEMOSTAT SNOW SURGICEL 2X4 (HEMOSTASIS) IMPLANT
KIT MARKER MARGIN INK (KITS) ×1 IMPLANT
NDL HYPO 25X1 1.5 SAFETY (NEEDLE) ×1 IMPLANT
NEEDLE HYPO 25X1 1.5 SAFETY (NEEDLE) ×1 IMPLANT
NS IRRIG 1000ML POUR BTL (IV SOLUTION) ×1 IMPLANT
PACK BASIN DAY SURGERY FS (CUSTOM PROCEDURE TRAY) ×1 IMPLANT
PENCIL SMOKE EVACUATOR (MISCELLANEOUS) ×1 IMPLANT
SLEEVE SCD COMPRESS KNEE MED (STOCKING) ×1 IMPLANT
SPIKE FLUID TRANSFER (MISCELLANEOUS) IMPLANT
SPONGE T-LAP 4X18 ~~LOC~~+RFID (SPONGE) ×1 IMPLANT
SUT MNCRL AB 4-0 PS2 18 (SUTURE) ×1 IMPLANT
SUT SILK 2 0 SH (SUTURE) IMPLANT
SUT VICRYL 3-0 CR8 SH (SUTURE) ×1 IMPLANT
SYR CONTROL 10ML LL (SYRINGE) ×1 IMPLANT
TOWEL GREEN STERILE FF (TOWEL DISPOSABLE) ×1 IMPLANT
TRAY FAXITRON CT DISP (TRAY / TRAY PROCEDURE) ×1 IMPLANT
TUBE CONNECTING 20X1/4 (TUBING) IMPLANT
YANKAUER SUCT BULB TIP NO VENT (SUCTIONS) IMPLANT

## 2023-02-09 NOTE — Anesthesia Procedure Notes (Signed)
Procedure Name: LMA Insertion Date/Time: 02/09/2023 8:49 AM  Performed by: Tawni Millers, CRNAPre-anesthesia Checklist: Patient identified, Emergency Drugs available, Suction available and Patient being monitored Patient Re-evaluated:Patient Re-evaluated prior to induction Oxygen Delivery Method: Circle system utilized Preoxygenation: Pre-oxygenation with 100% oxygen Induction Type: IV induction Ventilation: Mask ventilation without difficulty LMA: LMA inserted LMA Size: 4.0 Number of attempts: 1 Airway Equipment and Method: Bite block Placement Confirmation: positive ETCO2 Tube secured with: Tape Dental Injury: Teeth and Oropharynx as per pre-operative assessment

## 2023-02-09 NOTE — Op Note (Signed)
Preoperative diagnosis: Right breast fibroadenoma with fibrocystic change and microcalcifications, left breast cyst with left nipple discharge  Postoperative diagnosis: Same  Procedure: Bilateral breast seed localized lumpectomy  Surgeon: Erroll Luna, MD  Anesthesia: LMA with 0.25% Marcaine plain  EBL: Minimal  Specimen: Left breast tissue with seed and clip verified by Faxitron, right breast tissue with mass and seed and microcalcifications.  Of note clip on the right was dislodged.  Additional superior margins were taken as well as lateral margin and this was negative for the clip.  No residual gross disease left in the cavity though.  Drains: None  IV fluids: Per anesthesia record  Indications for procedure: The patient is a 72 year old female with a right breast fibroadenoma and also left nipple discharge.  Workup revealed a fibroadenoma on the right with micro calcifications and change as well as cyst on the left.  She has a history of breast cancer in her family and has concerns about these findings.  We discussed that they are benign.  We also discussed the rationale for removal especially given the dense tissue on the right and microcalcifications.  We reviewed nonoperative management strategies as well.  She also had left nipple discharge.  I told her that removing this area may help with there is no guarantee would stop the left nipple discharge.  After lengthy discussion of the pros and cons of removing these lesions, she wished to proceed given her strong family history of breast cancer she stated.  Risks and benefits were reviewed as well as expected recovery and complications of the procedure.  We discussed cosmesis afterwards.  Risks and benefits were reviewed and she wished to proceed.  Risk of bleeding, infection, cosmesis, wound complication, drainage, pain, injury to the nipple, nipple loss, anesthesia risk, scarring, and the need for the treatments and procedures  reviewed.  Description of procedure: The patient was seen in the holding area.  Both breasts were marked as correct site.  All questions were answered.  She was taken back to the operating room.  She was placed supine upon the OR table.  After induction of general esthesia both breasts were prepped and draped in a sterile fashion and timeout performed.  Films were available for review.  The left side was done first.  The neoprobe was used to identify the seed in the left nipple.  Local anesthetic was infiltrated along the medial border of the nipple areolar complex.  A curvilinear incision was made.  Dissection was carried down under the nipple itself.  Milk ducts were divided from the nipple and the area was excised.  Seed and clip in the specimen after orientation with ink and taking a Faxitron image.  This cavity was irrigated.  We then closed the cavity of the deep layer 3-0 Vicryl.  4 Monocryl was used as skin closure.  Dermabond applied.  The right side was done.  The films were available for review.  The neoprobe was used to identify the seed in the medial upper right breast.  A curvilinear incision was made along the medial border of the nipple areolar complex.  Dissection was carried down.  There is significant dense fibrocystic disease.  The mass was palpable where the seed was placed.  We excised all tissue around the signal.  The Faxitron image revealed only the seed to be present but the mass was palpable grossly.  She did have dense fibrocystic tissue and there is significant manipulation of the area to excise this.  I  elected to take more superior and more lateral tissue and this was concordant with gross fibrous tissue.  We excised this back to healthy fat.  The cavity had no more fibrocystic change that I could see and there is nothing left but healthy fat breast tissue.  Additional images of this margin were taken and the clip was not found.  Given the amount of the manipulation and the benign  nature of this lesion I chose to take no more tissue at this point in time.  This was a wide excision that encompassed the entire mammographic area.  Irrigation was used.  This was suctioned out.  Hemostasis was achieved.  We closed the deep layers with 3-0 Vicryl.  4-0 Monocryl was used to close the skin in a subcuticular fashion.  Dermabond was applied.  Breast binder placed.  All counts found to be correct.  The patient was awoke extubated taken recovery in satisfactory condition.

## 2023-02-09 NOTE — Interval H&P Note (Signed)
History and Physical Interval Note:  02/09/2023 8:03 AM  Jocelyn Ford  has presented today for surgery, with the diagnosis of LEFT BREAST MASS AND RIGHT BREAST FIBROADENOMA.  The various methods of treatment have been discussed with the patient and family. After consideration of risks, benefits and other options for treatment, the patient has consented to  Procedure(s): BILATERAL BREAST LUMPECTOMY WITH RADIOACTIVE SEED LOCALIZATION (Bilateral) as a surgical intervention.  The patient's history has been reviewed, patient examined, no change in status, stable for surgery.  I have reviewed the patient's chart and labs.  Questions were answered to the patient's satisfaction.     [place]

## 2023-02-09 NOTE — H&P (Signed)
History of Present Illness: Almetia Nop is a 72 y.o. female who is seen today as an office consultation for evaluation of New Consultation (Left Nipple discharge) .   Patient presents for evaluation of bilateral mammographic abnormality and left nipple discharge which has been bloody at times. Patient has noted left nipple discharge. Workup included mammogram as well as MRI. Core biopsies were noted of 2 masses 1 on the right which is a fibroadenoma in 1 on the left in the subareolar position consistent with cystic tissue. She still has intermittent drainage from the left nipple which is bloody. Her sister had breast cancer who is finishing treatment of that. Otherwise, she has no other complaints except the left breast nipple discharge. This has been going on since at least earlier in October.  Review of Systems: A complete review of systems was obtained from the patient. I have reviewed this information and discussed as appropriate with the patient. See HPI as well for other ROS.    Medical History: Past Medical History:  Diagnosis Date  Arthritis  Chronic kidney disease  Hypertension   There is no problem list on file for this patient.  Past Surgical History:  Procedure Laterality Date  HYSTERECTOMY 1976  Left, Cardiac Cath 01/28/2015  Breast Biopsy Surgery 11/04/2022    Allergies  Allergen Reactions  Doxycycline Unknown  Nifedipine Unknown  REACTION: Nausea,weakness   Current Outpatient Medications on File Prior to Visit  Medication Sig Dispense Refill  allopurinoL (ZYLOPRIM) 100 MG tablet Take 200 mg by mouth once daily  aspirin-acetaminophen-caffeine (EXCEDRIN MIGRAINE) 250-250-65 mg per tablet Take by mouth  atenoloL (TENORMIN) 25 MG tablet Take 1 tablet by mouth 2 (two) times daily  cholecalciferol (VITAMIN D3) 1000 unit tablet Take by mouth  ferrous sulfate 325 (65 FE) MG tablet Take 1 tablet by mouth once daily  fluticasone propionate (FLONASE) 50 mcg/actuation  nasal spray Place into one nostril  FUROsemide (LASIX) 40 MG tablet as directed  losartan (COZAAR) 100 MG tablet Take 100 mg by mouth once daily  potassium chloride (KLOR-CON) 20 MEQ ER tablet TAKE 3 TABLETS BY MOUTH 3 TIMES DAILY. -INS WILL ONLY PAY FOR 5 A DAY   No current facility-administered medications on file prior to visit.   History reviewed. No pertinent family history.   Social History   Tobacco Use  Smoking Status Former  Years: 30  Types: Cigarettes  Smokeless Tobacco Never    Social History   Socioeconomic History  Marital status: Divorced  Tobacco Use  Smoking status: Former  Years: 30  Types: Cigarettes  Smokeless tobacco: Never  Substance and Sexual Activity  Alcohol use: Not Currently  Drug use: Never   Objective:   Vitals:  12/27/22 1345  BP: (!) 150/80  Pulse: 88  Temp: 36.2 C (97.1 F)  SpO2: 98%  Weight: (!) 101.6 kg (224 lb)  Height: 170.2 cm (5' 7"$ )  PainSc: 0-No pain  PainLoc: Breast   Body mass index is 35.08 kg/m.  Physical Exam HENT:  Head: Normocephalic.  Eyes:  Pupils: Pupils are equal, round, and reactive to light.  Cardiovascular:  Rate and Rhythm: Normal rate.  Pulmonary:  Effort: Pulmonary effort is normal.  Musculoskeletal:  General: Normal range of motion.  Skin: General: Skin is warm and dry.  Neurological:  General: No focal deficit present.  Mental Status: She is alert.  Psychiatric:  Mood and Affect: Mood normal.  Behavior: Behavior normal.     Labs, Imaging and Diagnostic Testing: Breast, left, needle  core biopsy, 9 o'clock subareolar BENIGN BREAST CYST. NEGATIVE FOR MALIGNANCY. SEE NOTE. Diagnosis Note [employer] was notified on 11/05/2022. Diagnosis Breast, right, needle core biopsy, upper inner FIBROADENOMA. FIBROCYSTIC CHANGES. NEGATIVE FOR MALIGNANCY. SEE NOTE. Diagnosis Note [facility] was notified on 12/14/2022. Claudette Laws MD Pathologist, Electronic  Signature (Case signed 12/14/2022)  CLINICAL DATA: 72 year old female with left nipple discharge. Recent ultrasound-guided biopsy of the subareolar left breast demonstrated benign, concordant results.  EXAM: BILATERAL BREAST MRI WITH AND WITHOUT CONTRAST  TECHNIQUE: Multiplanar, multisequence MR images of both breasts were obtained prior to and following the intravenous administration of 10 ml of Vueway.  Three-dimensional MR images were rendered by post-processing of the original MR data on an independent workstation. The three-dimensional MR images were interpreted, and findings are reported in the following complete MRI report for this study. Three dimensional images were evaluated at the independent interpreting workstation using the DynaCAD thin client.  COMPARISON: Previous exam(s).  FINDINGS: Breast composition: c. Heterogeneous fibroglandular tissue.  Background parenchymal enhancement: Marked.  Right breast: There is an oval, circumscribed 8 mm enhancing mass in the slightly medial subareolar right breast at mid depth (series 10, image 63/144). This demonstrates progressive enhancement kinetics, greater than the remainder of the background parenchymal enhancement bilaterally. No other suspicious mass or abnormal enhancement within the limitations of marked background parenchymal enhancement.  Left breast: No suspicious mass or abnormal enhancement within the limitations of marked background parenchymal enhancement.  Lymph nodes: No abnormal appearing lymph nodes.  Ancillary findings: None.  IMPRESSION: 1. Indeterminate 8 mm enhancing mass in the subareolar right breast (series 10, image 63). 2. No other suspicious mass or abnormal enhancement bilaterally within the limitations of marked background parenchymal enhancement. 3. No suspicious lymphadenopathy.  RECOMMENDATION: 1. MRI guided biopsy of the indeterminate right breast mass. This is felt unlikely to  be seen by second-look ultrasound given the size and location. 2. Clinical and surgical follow-up for the patient's left nipple discharge. No definite etiology is identified on today's examination within the limitations of marked background parenchymal enhancement.  BI-RADS CATEGORY 4: Suspicious.   Electronically Signed By: Kristopher Oppenheim M.D. On: 12/06/2022 09:01   Assessment and Plan:   Diagnoses and all orders for this visit:  Fibroadenoma, right  Subareolar mass of left breast  Bloody discharge from left nipple    Reviewed her pathology as well as her MRI findings. Both are benign findings and can be observed. She is concerned there with her history and her sister breast cancer about these areas. We discussed that these are benign findings. Certainly she does have discharge on the left and the pathology was noted but she continues to have discharge on the left. On the right side fibroadenoma. We discussed observing these areas since they are benign but she would like to have them removed for peace of mind even though the risk of cancer with these findings are extremely low. We discussed the pros and cons of surgery and that removal of the left subareolar mass may improve her discharge or help it to go away. Also we have more definitive pathology since the initial core only showed a cyst. She is aware of the benign nature of these areas but desires excision due to family history of breast cancer. Pros and cons of bilateral lumpectomies reviewed as well as cosmetic issues, nipple complications and overall complications of lumpectomy in general. Risk of bleeding, infection, cosmetic deformity, nipple loss, numbness the nipple, and the need  for the treatment standard procedures reviewed with the patient. Nonoperative and follow-up measures were discussed. She is agreeable to surgery.  No follow-ups on file.  Kennieth Francois, MD

## 2023-02-09 NOTE — Discharge Instructions (Addendum)
[facility] Office Phone Number [phone]  BREAST BIOPSY/ PARTIAL MASTECTOMY: POST OP INSTRUCTIONS  Always review your discharge instruction sheet given to you by the facility where your surgery was performed.  IF YOU HAVE DISABILITY OR FAMILY LEAVE FORMS, YOU MUST BRING THEM TO THE OFFICE FOR PROCESSING.  DO NOT GIVE THEM TO YOUR DOCTOR.  A prescription for pain medication may be given to you upon discharge.  Take your pain medication as prescribed, if needed.  If narcotic pain medicine is not needed, then you may take acetaminophen (Tylenol) or ibuprofen (Advil) as needed. Take your usually prescribed medications unless otherwise directed If you need a refill on your pain medication, please contact your pharmacy.  They will contact our office to request authorization.  Prescriptions will not be filled after 5pm or on week-ends. You should eat very light the first 24 hours after surgery, such as soup, crackers, pudding, etc.  Resume your normal diet the day after surgery. Most patients will experience some swelling and bruising in the breast.  Ice packs and a good support bra will help.  Swelling and bruising can take several days to resolve.  It is common to experience some constipation if taking pain medication after surgery.  Increasing fluid intake and taking a stool softener will usually help or prevent this problem from occurring.  A mild laxative (Milk of Magnesia or Miralax) should be taken according to package directions if there are no bowel movements after 48 hours. Unless discharge instructions indicate otherwise, you may remove your bandages 24-48 hours after surgery, and you may shower at that time.  You may have steri-strips (small skin tapes) in place directly over the incision.  These strips should be left on the skin for 7-10 days.  If your surgeon used skin glue on the incision, you may shower in 24 hours.  The glue will flake off over the next 2-3 weeks.  Any  sutures or staples will be removed at the office during your follow-up visit. ACTIVITIES:  You may resume regular daily activities (gradually increasing) beginning the next day.  Wearing a good support bra or sports bra minimizes pain and swelling.  You may have sexual intercourse when it is comfortable. You may drive when you no longer are taking prescription pain medication, you can comfortably wear a seatbelt, and you can safely maneuver your car and apply brakes. RETURN TO WORK:  ______________________________________________________________________________________ You should see your doctor in the office for a follow-up appointment approximately two weeks after your surgery.  Your doctor's nurse will typically make your follow-up appointment when she calls you with your pathology report.  Expect your pathology report 2-3 business days after your surgery.  You may call to check if you do not hear from us after three days. OTHER INSTRUCTIONS: _______________________________________________________________________________________________ _____________________________________________________________________________________________________________________________________ _____________________________________________________________________________________________________________________________________ _____________________________________________________________________________________________________________________________________  WHEN TO CALL YOUR DOCTOR: Fever over 101.0 Nausea and/or vomiting. Extreme swelling or bruising. Continued bleeding from incision. Increased pain, redness, or drainage from the incision.  The clinic staff is available to answer your questions during regular business hours.  Please don't hesitate to call and ask to speak to one of the nurses for clinical concerns.  If you have a medical emergency, go to the nearest emergency room or call 911.  A surgeon from [facility] is always on call at the hospital.  For further questions, please visit [email or web address]    Post Anesthesia Home Care Instructions  Activity: Get plenty of rest for the remainder of   of the day. A responsible individual must stay with you for 24 hours following the procedure.  For the next 24 hours, DO NOT: -Drive a car -Paediatric nurse -Drink alcoholic beverages -Take any medication unless instructed by your physician -Make any legal decisions or sign important papers.  Meals: Start with liquid foods such as gelatin or soup. Progress to regular foods as tolerated. Avoid greasy, spicy, heavy foods. If nausea and/or vomiting occur, drink only clear liquids until the nausea and/or vomiting subsides. Call your physician if vomiting continues.  Special Instructions/Symptoms: Your throat may feel dry or sore from the anesthesia or the breathing tube placed in your throat during surgery. If this causes discomfort, gargle with warm salt water. The discomfort should disappear within 24 hours.  May take Tylenol after 1:30pm if needed.

## 2023-02-09 NOTE — Anesthesia Postprocedure Evaluation (Signed)
Anesthesia Post Note  Patient: Malan Morgano  Procedure(s) Performed: BILATERAL BREAST LUMPECTOMY WITH RADIOACTIVE SEED LOCALIZATION (Bilateral: Breast)     Patient location during evaluation: PACU Anesthesia Type: General Level of consciousness: awake Pain management: pain level controlled Vital Signs Assessment: post-procedure vital signs reviewed and stable Respiratory status: spontaneous breathing, nonlabored ventilation and respiratory function stable Cardiovascular status: blood pressure returned to baseline and stable Postop Assessment: no apparent nausea or vomiting Anesthetic complications: no   No notable events documented.  Last Vitals:  Vitals:   02/09/23 1045 02/09/23 1120  BP: (!) 140/69 (!) 155/85  Pulse: 78 81  Resp: 14 16  Temp:  (!) 36.2 C  SpO2: 98% 98%    Last Pain:  Vitals:   02/09/23 1120  TempSrc: Oral  PainSc: 0-No pain                 Jonathin Heinicke P Ronal Maybury

## 2023-02-09 NOTE — Anesthesia Preprocedure Evaluation (Addendum)
Anesthesia Evaluation  Patient identified by MRN, date of birth, ID band Patient awake    Reviewed: Allergy & Precautions, NPO status , Patient's Chart, lab work & pertinent test results  Airway Mallampati: II  TM Distance: >3 FB Neck ROM: Full    Dental no notable dental hx.    Pulmonary sleep apnea , former smoker   Pulmonary exam normal        Cardiovascular hypertension, Pt. on home beta blockers and Pt. on medications Normal cardiovascular exam     Neuro/Psych  Headaches  negative psych ROS   GI/Hepatic negative GI ROS, Neg liver ROS,,,  Endo/Other  negative endocrine ROS    Renal/GU CRFRenal disease     Musculoskeletal  (+) Arthritis ,  Gout   Abdominal  (+) + obese  Peds  Hematology negative hematology ROS (+)   Anesthesia Other Findings LEFT BREAST MASS AND RIGHT BREAST FIBROADENOMA  Reproductive/Obstetrics                             Anesthesia Physical Anesthesia Plan  ASA: 3  Anesthesia Plan: General   Post-op Pain Management:    Induction: Intravenous  PONV Risk Score and Plan: 3 and Ondansetron, Dexamethasone, Midazolam and Treatment may vary due to age or medical condition  Airway Management Planned: LMA  Additional Equipment:   Intra-op Plan:   Post-operative Plan:   Informed Consent: I have reviewed the patients History and Physical, chart, labs and discussed the procedure including the risks, benefits and alternatives for the proposed anesthesia with the patient or authorized representative who has indicated his/her understanding and acceptance.     Dental advisory given  Plan Discussed with: CRNA  Anesthesia Plan Comments:        Anesthesia Quick Evaluation

## 2023-02-09 NOTE — Transfer of Care (Signed)
Immediate Anesthesia Transfer of Care Note  Patient: Jocelyn Ford  Procedure(s) Performed: BILATERAL BREAST LUMPECTOMY WITH RADIOACTIVE SEED LOCALIZATION (Bilateral: Breast)  Patient Location: PACU  Anesthesia Type:General  Level of Consciousness: sedated  Airway & Oxygen Therapy: Patient Spontanous Breathing and Patient connected to face mask oxygen  Post-op Assessment: Report given to RN and Post -op Vital signs reviewed and stable  Post vital signs: Reviewed and stable  Last Vitals:  Vitals Value Taken Time  BP    Temp    Pulse 67 02/09/23 1003  Resp 15 02/09/23 1003  SpO2 100 % 02/09/23 1003  Vitals shown include unvalidated device data.  Last Pain:  Vitals:   02/09/23 0721  TempSrc: Tympanic  PainSc: 0-No pain      Patients Stated Pain Goal: 5 ([id number])  Complications: No notable events documented.

## 2023-02-10 ENCOUNTER — Encounter (HOSPITAL_COMMUNITY): Payer: Self-pay

## 2023-02-10 ENCOUNTER — Encounter (HOSPITAL_BASED_OUTPATIENT_CLINIC_OR_DEPARTMENT_OTHER): Payer: Self-pay | Admitting: Surgery

## 2023-02-15 LAB — SURGICAL PATHOLOGY

## 2023-02-16 ENCOUNTER — Encounter: Payer: Self-pay | Admitting: Surgery

## 2023-02-17 DIAGNOSIS — D509 Iron deficiency anemia, unspecified: Secondary | ICD-10-CM | POA: Diagnosis not present

## 2023-02-17 DIAGNOSIS — R7303 Prediabetes: Secondary | ICD-10-CM | POA: Diagnosis not present

## 2023-02-17 DIAGNOSIS — N183 Chronic kidney disease, stage 3 unspecified: Secondary | ICD-10-CM | POA: Diagnosis not present

## 2023-02-17 DIAGNOSIS — M109 Gout, unspecified: Secondary | ICD-10-CM | POA: Diagnosis not present

## 2023-02-17 DIAGNOSIS — I129 Hypertensive chronic kidney disease with stage 1 through stage 4 chronic kidney disease, or unspecified chronic kidney disease: Secondary | ICD-10-CM | POA: Diagnosis not present

## 2023-02-17 DIAGNOSIS — E213 Hyperparathyroidism, unspecified: Secondary | ICD-10-CM | POA: Diagnosis not present

## 2023-02-19 LAB — LAB REPORT - SCANNED: Creatinine, POC: 104.2 mg/dL

## 2023-03-18 ENCOUNTER — Encounter (HOSPITAL_COMMUNITY): Payer: Self-pay

## 2023-03-18 ENCOUNTER — Ambulatory Visit (HOSPITAL_COMMUNITY)
Admission: EM | Admit: 2023-03-18 | Discharge: 2023-03-18 | Disposition: A | Discharge status: Home / Self Care | Payer: Medicare Other | Attending: Nurse Practitioner | Admitting: Nurse Practitioner

## 2023-03-18 ENCOUNTER — Ambulatory Visit (INDEPENDENT_AMBULATORY_CARE_PROVIDER_SITE_OTHER): Payer: Medicare Other

## 2023-03-18 DIAGNOSIS — R059 Cough, unspecified: Secondary | ICD-10-CM | POA: Diagnosis not present

## 2023-03-18 DIAGNOSIS — J069 Acute upper respiratory infection, unspecified: Secondary | ICD-10-CM

## 2023-03-18 MED ORDER — BENZONATATE 100 MG PO CAPS: 100.0000 mg | ORAL_CAPSULE | Freq: Three times a day (TID) | ORAL | 0 refills | Status: DC | PRN

## 2023-03-18 NOTE — ED Triage Notes (Signed)
Pt sttaes that she has had a cough and congestion for about 1.5 weeks. Mucus is green. Taking mucinex and Coricidin

## 2023-03-18 NOTE — Discharge Instructions (Addendum)
As we discussed, you likely have a cough from a viral infection.   You have a viral upper respiratory infection.  Symptoms should improve over the next week to 10 days.  If you develop chest pain or shortness of breath, go to the emergency room.  Some things that can make you feel better are: - Increased rest - Increasing fluid with water/sugar free electrolytes - Acetaminophen and ibuprofen as needed for fever/pain - Salt water gargling, chloraseptic spray and throat lozenges - OTC guaifenesin (Mucinex) 600 mg twice daily - Saline sinus flushes or a neti pot - Humidifying the air -Tessalon Perles during the day as needed for dry cough

## 2023-03-18 NOTE — ED Provider Notes (Signed)
[name]    CSN: [id number] Arrival date & time: 03/18/23  1231      History   Chief Complaint Chief Complaint  Patient presents with   Cough    HPI Jocelyn Ford is a 72 y.o. female.   Patient presents today for 10-day history of congested cough, chest and nasal congestion, runny nose, postnasal drainage, sore throat that has not improved, and fatigue.  She denies fever, body aches or chills, shortness of breath or chest pain, pain with inspiration, chest tightness, new headaches, ear pain, abdominal pain, nausea/vomiting, diarrhea, decreased appetite, loss of taste or smell, or new rash.  Has been taking Mucinex and Coricidin for symptoms which seems to help break it up.  Reports she is concerned about it going into her chest.  Patient is noted to be hypertensive today in urgent care.  Reports she checks her blood pressure twice daily at home, this morning was 129/70.  Reports whenever she goes to a doctor's office, her blood pressure is elevated.  No chest pain, shortness of breath, dizziness/lightheadedness, vision changes, lower extremity swelling, or headache today.    Past Medical History:  Diagnosis Date   Acute gouty arthropathy    Allergic rhinitis, cause unspecified    Arthritis    Chronic kidney disease    Complication of anesthesia    hard time waking up   Dizziness and giddiness    Dysuria    Esophageal reflux    Heart murmur    Iron deficiency anemia, unspecified    Nonspecific abnormal electrocardiogram (ECG) (EKG)    Osteoporosis, unspecified    Pure hypercholesterolemia    Routine general medical examination at a health care facility    Unspecified essential hypertension     Patient Active Problem List   Diagnosis Date Noted   Insomnia 07/12/2022   OSA (obstructive sleep apnea) 04/13/2022   Pulmonary nodule less than 6 mm determined by computed tomography of lung 12/25/2021   Primary osteoarthritis of both hands 10/31/2018    Primary osteoarthritis of both feet 10/31/2018   Leg swelling 08/01/2018   Paresthesia 04/24/2018   Asymptomatic menopausal state 12/11/2016   Hyperparathyroidism ([place]) 11/29/2016   Dyspnea 08/18/2016   Chronic tension-type headache, not intractable 01/30/2016   Dizziness and giddiness 01/07/2016   Headache 01/07/2016   Fatigue 01/07/2016   Renal insufficiency 08/20/2015   Abnormal stress test    Chest pain 09/27/2014   Menopausal state 09/27/2014   Gout 08/08/2014   Routine general medical examination at a health care facility 10/03/2013   Hypokalemia 10/03/2013   Encounter for long-term (current) use of other medications 08/21/2013   Idiopathic chronic gout of multiple sites without tophus 07/18/2009   Anemia, iron deficiency 11/07/2008   HYPERCHOLESTEROLEMIA 11/01/2008   ELECTROCARDIOGRAM, ABNORMAL 11/01/2008   Essential hypertension 07/28/2007   ALLERGIC RHINITIS 07/28/2007   GERD 07/28/2007   Osteoporosis 07/28/2007    Past Surgical History:  Procedure Laterality Date   ABDOMINAL HYSTERECTOMY     BREAST BIOPSY Left 11/04/2022   [place] LT BREAST BX W LOC DEV 1ST LESION IMG BX SPEC US GUIDE 11/04/2022 [facility]   BREAST BIOPSY  02/08/2023   MM LT RADIOACTIVE SEED LOC MAMMO GUIDE 02/08/2023 [facility]   BREAST BIOPSY  02/08/2023   MM RT RADIOACTIVE SEED LOC MAMMO GUIDE 02/08/2023 [facility]   BREAST LUMPECTOMY WITH RADIOACTIVE SEED LOCALIZATION Bilateral 02/09/2023   Procedure: BILATERAL BREAST LUMPECTOMY WITH RADIOACTIVE SEED LOCALIZATION;  Surgeon: Erroll Luna, MD;  Location: [place];  Service: General;  Laterality: Bilateral;   LEFT HEART CATHETERIZATION WITH CORONARY ANGIOGRAM N/A 01/28/2015   Procedure: LEFT HEART CATHETERIZATION WITH CORONARY ANGIOGRAM;  Surgeon: Candee Furbish, MD;  Location[facility] C[facility];  Service: Cardiovascular;  Laterality: N/A;    OB History   No obstetric history on file.      Home Medications    Prior  to Admission medications   Medication Sig Start Date End Date Taking? Authorizing Provider  atenolol (TENORMIN) 25 MG tablet TAKE 1 TABLET BY MOUTH TWICE A DAY 01/12/23  Yes [name], MD  benzonatate (TESSALON) 100 MG capsule Take 1 capsule (100 mg total) by mouth 3 (three) times daily as needed for cough. Do not take with alcohol or while driving or operating heavy machinery.  May cause drowsiness. 03/18/23  Yes Eulogio Bear, NP  cholecalciferol (VITAMIN D) 25 MCG (1000 UNIT) tablet Take 1,000 Units by mouth daily.   Yes [provider]  FERROUS SULFATE PO Take 1 tablet by mouth daily.   Yes [provider]  fluticasone (FLONASE) 50 MCG/ACT nasal spray PLACE 1 SPRAY INTO BOTH NOSTRILS DAILY AS NEEDED FOR ALLERGIES. 10/06/22  Yes [name], MD  furosemide (LASIX) 40 MG tablet TAKE 1 TABLET BY MOUTH DAILY, IF INCREASED EDEMA, MAY TAKE ADDITIONAL 1/2 TAB (20 MG ) FOR THAT DAY. PLEASE NOTIFY OFFICE IF INCREASED EDEMA PERSISTS. 09/20/22  Yes [name], MD  losartan (COZAAR) 100 MG tablet TAKE 1 TABLET BY MOUTH EVERY DAY 02/01/23  Yes [name], MD  potassium chloride SA (KLOR-CON M20) 20 MEQ tablet TAKE 3 TABLETS BY MOUTH 3 TIMES DAILY. -INS WILL ONLY PAY FOR 5 A DAY 11/30/22  Yes [name], MD  allopurinol (ZYLOPRIM) 100 MG tablet Take 200 mg by mouth daily. 04/11/20   [provider]  aspirin-acetaminophen-caffeine (EXCEDRIN MIGRAINE) [phone] MG tablet Take 2 tablets by mouth every 6 (six) hours as needed for headache or migraine.    [provider]  oxyCODONE (OXY IR/ROXICODONE) 5 MG immediate release tablet Take 1 tablet (5 mg total) by mouth every 6 (six) hours as needed for severe pain. 02/09/23   Erroll Luna, MD    Family History Family History  Problem Relation Age of Onset   Alzheimer's disease Mother    Hypertension Mother    Kidney disease Mother    Cancer Father        Prostate   Hypertension Father     Diabetes Sister    Hypertension Sister    Hypertension Sister    Breast cancer Sister 29   Healthy Daughter    Heart Problems Brother        stent   Hypertension Brother     Social History Social History   Tobacco Use   Smoking status: Former    Packs/day: 1.00    Years: 10.00    Additional pack years: 0.00    Total pack years: 10.00    Types: Cigarettes    Quit date: 01/02/1985    Years since quitting: 38.2   Smokeless tobacco: Never  Vaping Use   Vaping Use: Never used  Substance Use Topics   Alcohol use: No    Alcohol/week: 0.0 standard drinks of alcohol   Drug use: Never     Allergies   Doxycycline and Nifedipine   Review of Systems Review of Systems Per HPI  Physical Exam Triage Vital Signs ED Triage Vitals  Enc  Vitals Group     BP 03/18/23 1334 (!) 160/79     Pulse Rate 03/18/23 1334 77     Resp 03/18/23 1334 18     Temp 03/18/23 1334 (!) 97.5 F (36.4 C)     Temp Source 03/18/23 1334 Oral     SpO2 03/18/23 1334 98 %     Weight 03/18/23 1331 224 lb (101.6 kg)     Height --      Head Circumference --      Peak Flow --      Pain Score --      Pain Loc --      Pain Edu? --      Excl. in [place]? --    No data found.  Updated Vital Signs BP (!) 160/79   Pulse 77   Temp (!) 97.5 F (36.4 C) (Oral)   Resp 18   Wt 224 lb (101.6 kg)   SpO2 98%   BMI 35.08 kg/m   BP check: 157/79  Visual Acuity Right Eye Distance:   Left Eye Distance:   Bilateral Distance:    Right Eye Near:   Left Eye Near:    Bilateral Near:     Physical Exam Vitals and nursing note reviewed.  Constitutional:      General: She is not in acute distress.    Appearance: Normal appearance. She is not ill-appearing or toxic-appearing.  HENT:     Head: Normocephalic and atraumatic.     Right Ear: Tympanic membrane, ear canal and external ear normal.     Left Ear: Tympanic membrane, ear canal and external ear normal.     Nose: No congestion or rhinorrhea.      Mouth/Throat:     Mouth: Mucous membranes are moist.     Pharynx: Oropharynx is clear. Posterior oropharyngeal erythema present. No oropharyngeal exudate.  Eyes:     General: No scleral icterus.    Extraocular Movements: Extraocular movements intact.  Cardiovascular:     Rate and Rhythm: Normal rate and regular rhythm.     Heart sounds: Normal heart sounds.  Pulmonary:     Effort: Pulmonary effort is normal. No respiratory distress.     Breath sounds: Normal breath sounds. No wheezing, rhonchi or rales.  Abdominal:     General: Abdomen is flat. Bowel sounds are normal. There is no distension.     Palpations: Abdomen is soft.  Musculoskeletal:     Cervical back: Normal range of motion and neck supple.  Lymphadenopathy:     Cervical: No cervical adenopathy.  Skin:    General: Skin is warm and dry.     Coloration: Skin is not jaundiced or pale.     Findings: No erythema or rash.  Neurological:     Mental Status: She is alert and oriented to person, place, and time.  Psychiatric:        Behavior: Behavior is cooperative.      UC Treatments / Results  Labs (all labs ordered are listed, but only abnormal results are displayed) Labs Reviewed - No data to display  EKG   Radiology DG Chest 2 View  Result Date: 03/18/2023 CLINICAL DATA:  Cough for more than 1 week, congestion, productive of green mucus EXAM: CHEST - 2 VIEW COMPARISON:  04/24/2018 FINDINGS: Normal heart size, mediastinal contours, and pulmonary vascularity. Atherosclerotic calcification aorta. Lungs clear. No pulmonary infiltrate, pleural effusion, or pneumothorax. Osseous structures unremarkable. IMPRESSION: No acute abnormalities. Aortic Atherosclerosis (ICD10-I70.0). Electronically Signed  By: Lavonia Dana M.D.   On: 03/18/2023 13:56    Procedures Procedures (including critical care time)  Medications Ordered in UC Medications - No data to display  Initial Impression / Assessment and Plan / UC Course  I  have reviewed the triage vital signs and the nursing notes.  Pertinent labs & imaging results that were available during my care of the patient were reviewed by me and considered in my medical decision making (see chart for details).   Patient is well-appearing, afebrile, not tachycardic, not tachypneic, oxygenating well on room air.  Patient is mildly hypertensive in triage today, however reports normal blood pressure this morning at home.  Not symptomatic.  1. Viral URI with cough Vital signs and examination today are reassuring Chest x-ray is negative for acute cardiopulmonary process Suspect viral cough Treat with Tessalon Perles, continue Mucinex, Coricidin Increase hydration ER and return precautions discussed with patient  The patient was given the opportunity to ask questions.  All questions answered to their satisfaction.  The patient is in agreement to this plan.    Final Clinical Impressions(s) / UC Diagnoses   Final diagnoses:  Viral URI with cough     Discharge Instructions      As we discussed, you likely have a cough from a viral infection.   You have a viral upper respiratory infection.  Symptoms should improve over the next week to 10 days.  If you develop chest pain or shortness of breath, go to the emergency room.  Some things that can make you feel better are: - Increased rest - Increasing fluid with water/sugar free electrolytes - Acetaminophen and ibuprofen as needed for fever/pain - Salt water gargling, chloraseptic spray and throat lozenges - OTC guaifenesin (Mucinex) 600 mg twice daily - Saline sinus flushes or a neti pot - Humidifying the air -Tessalon Perles during the day as needed for dry cough      ED Prescriptions     Medication Sig Dispense Auth. Provider   benzonatate (TESSALON) 100 MG capsule Take 1 capsule (100 mg total) by mouth 3 (three) times daily as needed for cough. Do not take with alcohol or while driving or operating heavy  machinery.  May cause drowsiness. 21 capsule Eulogio Bear, NP      PDMP not reviewed this encounter.   Eulogio Bear, NP 03/18/23 [phone]

## 2023-03-31 ENCOUNTER — Other Ambulatory Visit: Payer: Self-pay | Admitting: Family Medicine

## 2023-03-31 DIAGNOSIS — J302 Other seasonal allergic rhinitis: Secondary | ICD-10-CM

## 2023-05-03 ENCOUNTER — Other Ambulatory Visit: Payer: Self-pay | Admitting: Family Medicine

## 2023-05-03 DIAGNOSIS — Z760 Encounter for issue of repeat prescription: Secondary | ICD-10-CM

## 2023-06-20 ENCOUNTER — Ambulatory Visit (INDEPENDENT_AMBULATORY_CARE_PROVIDER_SITE_OTHER): Payer: Medicare Other | Admitting: Family Medicine

## 2023-06-20 ENCOUNTER — Encounter: Payer: Self-pay | Admitting: Family Medicine

## 2023-06-20 VITALS — BP 126/82 | HR 78 | Temp 98.9°F | Resp 18 | Ht 67.0 in | Wt 225.5 lb

## 2023-06-20 DIAGNOSIS — Z0000 Encounter for general adult medical examination without abnormal findings: Secondary | ICD-10-CM | POA: Diagnosis not present

## 2023-06-20 DIAGNOSIS — Z9889 Other specified postprocedural states: Secondary | ICD-10-CM

## 2023-06-20 DIAGNOSIS — E782 Mixed hyperlipidemia: Secondary | ICD-10-CM

## 2023-06-20 DIAGNOSIS — I10 Essential (primary) hypertension: Secondary | ICD-10-CM

## 2023-06-20 LAB — TSH: TSH: 1.35 u[IU]/mL (ref 0.35–5.50)

## 2023-06-20 LAB — CBC WITH DIFFERENTIAL/PLATELET
Basophils Absolute: 0 10*3/uL (ref 0.0–0.1)
Basophils Relative: 0.9 % (ref 0.0–3.0)
Eosinophils Absolute: 0.4 10*3/uL (ref 0.0–0.7)
Eosinophils Relative: 8.8 % — ABNORMAL HIGH (ref 0.0–5.0)
HCT: 36.7 % (ref 36.0–46.0)
Hemoglobin: 11.8 g/dL — ABNORMAL LOW (ref 12.0–15.0)
Lymphocytes Relative: 45.6 % (ref 12.0–46.0)
Lymphs Abs: 2.2 10*3/uL (ref 0.7–4.0)
MCHC: 32 g/dL (ref 30.0–36.0)
MCV: 89.2 fl (ref 78.0–100.0)
Monocytes Absolute: 0.5 10*3/uL (ref 0.1–1.0)
Monocytes Relative: 9.4 % (ref 3.0–12.0)
Neutro Abs: 1.7 10*3/uL (ref 1.4–7.7)
Neutrophils Relative %: 35.3 % — ABNORMAL LOW (ref 43.0–77.0)
Platelets: 323 10*3/uL (ref 150.0–400.0)
RBC: 4.12 Mil/uL (ref 3.87–5.11)
RDW: 14.2 % (ref 11.5–15.5)
WBC: 4.9 10*3/uL (ref 4.0–10.5)

## 2023-06-20 LAB — LIPID PANEL
Cholesterol: 197 mg/dL (ref 0–200)
HDL: 37.2 mg/dL — ABNORMAL LOW (ref >39.00)
LDL Cholesterol: 139 mg/dL — ABNORMAL HIGH (ref 0–99)
NonHDL: 159.76
Total CHOL/HDL Ratio: 5
Triglycerides: 105 mg/dL (ref 0.0–149.0)
VLDL: 21 mg/dL (ref 0.0–40.0)

## 2023-06-20 LAB — HEMOGLOBIN A1C: Hgb A1c MFr Bld: 6.1 % (ref 4.6–6.5)

## 2023-06-20 LAB — COMPREHENSIVE METABOLIC PANEL
ALT: 11 U/L (ref 0–35)
AST: 17 U/L (ref 0–37)
Albumin: 4.2 g/dL (ref 3.5–5.2)
Alkaline Phosphatase: 68 U/L (ref 39–117)
BUN: 23 mg/dL (ref 6–23)
CO2: 28 mEq/L (ref 19–32)
Calcium: 10.4 mg/dL (ref 8.4–10.5)
Chloride: 104 mEq/L (ref 96–112)
Creatinine, Ser: 1.58 mg/dL — ABNORMAL HIGH (ref 0.40–1.20)
GFR: 33 mL/min — ABNORMAL LOW (ref >60.00)
Glucose, Bld: 95 mg/dL (ref 70–99)
Potassium: 3.6 mEq/L (ref 3.5–5.1)
Sodium: 140 mEq/L (ref 135–145)
Total Bilirubin: 0.4 mg/dL (ref 0.2–1.2)
Total Protein: 7.8 g/dL (ref 6.0–8.3)

## 2023-06-20 LAB — T4, FREE: Free T4: 0.67 ng/dL (ref 0.60–1.60)

## 2023-06-20 MED ORDER — LOSARTAN POTASSIUM 100 MG PO TABS: 100.0000 mg | ORAL_TABLET | Freq: Every day | ORAL | 3 refills | Status: DC

## 2023-06-20 NOTE — Progress Notes (Signed)
Established Patient Office Visit   Subjective  Patient ID: Jocelyn Ford, female    DOB: 1951-10-13  Age: 72 y.o. MRN: [id number]  Chief Complaint  Patient presents with   Annual Exam    Annual exam  Doing well per pt no concerns today     Patient is a 72 year old female seen for CPE.  Patient states she is doing well overall.  Just returned from a trip watching her grandkids.  States she is tired.  Patient notes less stress since bilateral lumpectomy which was benign.  Patient states BP at home well-controlled, 130s/70s on losartan 100 mg daily.      ROS Negative unless stated above    Objective:     BP 126/82   Pulse 78   Temp 98.9 F (37.2 C) (Oral)   Resp 18   Ht 5\' 7"  (1.702 m)   Wt 225 lb 8 oz (102.3 kg)   SpO2 98%   BMI 35.32 kg/m    Physical Exam Constitutional:      Appearance: Normal appearance.  HENT:     Head: Normocephalic and atraumatic.     Right Ear: Tympanic membrane, ear canal and external ear normal.     Left Ear: Tympanic membrane, ear canal and external ear normal.     Nose: Nose normal.     Mouth/Throat:     Mouth: Mucous membranes are moist.     Pharynx: No oropharyngeal exudate or posterior oropharyngeal erythema.  Eyes:     General: No scleral icterus.    Extraocular Movements: Extraocular movements intact.     Conjunctiva/sclera: Conjunctivae normal.     Pupils: Pupils are equal, round, and reactive to light.  Neck:     Thyroid: No thyromegaly.  Cardiovascular:     Rate and Rhythm: Normal rate and regular rhythm.     Pulses: Normal pulses.     Heart sounds: Normal heart sounds. No murmur heard.    No friction rub.  Pulmonary:     Effort: Pulmonary effort is normal.     Breath sounds: Normal breath sounds. No wheezing, rhonchi or rales.  Abdominal:     General: Bowel sounds are normal.     Palpations: Abdomen is soft.     Tenderness: There is no abdominal tenderness.  Musculoskeletal:        General: No deformity.  Normal range of motion.  Lymphadenopathy:     Cervical: No cervical adenopathy.  Skin:    General: Skin is warm and dry.     Findings: No lesion.  Neurological:     General: No focal deficit present.     Mental Status: She is alert and oriented to person, place, and time.  Psychiatric:        Mood and Affect: Mood normal.        Thought Content: Thought content normal.      No results found for any visits on 06/20/23.    Assessment & Plan:  Well adult exam -Age-appropriate health screenings discussed -Mammogram due later this year -Colonoscopy done 12/07/2013.  10-year surveillance due later this year. -Immunizations reviewed -Pap not indicated 2/2 hysterectomy. -Next CPE in 1 year -     CBC with Differential/Platelet -     TSH -     T4, free -     Hemoglobin A1c  Mixed hyperlipidemia -Total cholesterol 205, triglycerides 111, HDL 37, LDL 146 on 02/13/2020 -Lifestyle modifications encouraged -     Lipid panel -  Comprehensive metabolic panel  Essential hypertension -Controlled -Continue losartan 100 mg daily       -     TSH -     T4, free -     Comprehensive metabolic panel -     Losartan 161 mg  S/P bilateral breast lumpectomy -h/o nipple discharge September 2023 -Fibroadenomas in bilateral breasts removed. -Continue with regular mammograms   Return if symptoms worsen or fail to improve.  Schedule annual wellness visit.    Deeann Saint, MD

## 2023-07-04 ENCOUNTER — Other Ambulatory Visit: Payer: Self-pay | Admitting: Surgery

## 2023-07-04 DIAGNOSIS — Z139 Encounter for screening, unspecified: Secondary | ICD-10-CM

## 2023-07-05 ENCOUNTER — Ambulatory Visit: Payer: Medicare Other

## 2023-08-15 DIAGNOSIS — I129 Hypertensive chronic kidney disease with stage 1 through stage 4 chronic kidney disease, or unspecified chronic kidney disease: Secondary | ICD-10-CM | POA: Diagnosis not present

## 2023-08-15 DIAGNOSIS — M109 Gout, unspecified: Secondary | ICD-10-CM | POA: Diagnosis not present

## 2023-08-15 DIAGNOSIS — R7303 Prediabetes: Secondary | ICD-10-CM | POA: Diagnosis not present

## 2023-08-15 DIAGNOSIS — E213 Hyperparathyroidism, unspecified: Secondary | ICD-10-CM | POA: Diagnosis not present

## 2023-08-15 DIAGNOSIS — N183 Chronic kidney disease, stage 3 unspecified: Secondary | ICD-10-CM | POA: Diagnosis not present

## 2023-08-15 DIAGNOSIS — D509 Iron deficiency anemia, unspecified: Secondary | ICD-10-CM | POA: Diagnosis not present

## 2023-09-14 ENCOUNTER — Telehealth: Payer: Self-pay | Admitting: Family Medicine

## 2023-09-14 NOTE — Telephone Encounter (Signed)
Pt called to inform MD that she has been prescribed Viviscal pro supplement for hair growth, and would like to know if it will interact with the current medications she is already taking?  Please advise.

## 2023-09-25 ENCOUNTER — Other Ambulatory Visit: Payer: Self-pay | Admitting: Family Medicine

## 2023-09-25 DIAGNOSIS — I129 Hypertensive chronic kidney disease with stage 1 through stage 4 chronic kidney disease, or unspecified chronic kidney disease: Secondary | ICD-10-CM

## 2023-09-28 DIAGNOSIS — H1045 Other chronic allergic conjunctivitis: Secondary | ICD-10-CM | POA: Diagnosis not present

## 2023-09-28 NOTE — Telephone Encounter (Signed)
Pt called to F/U stating she is still waiting for a call back.

## 2023-10-03 NOTE — Telephone Encounter (Signed)
Likely ok, however I cannot see the ingredients in it when looking online.

## 2023-10-04 NOTE — Telephone Encounter (Signed)
Spoke with patient, she is going to find the ingredients and send them

## 2023-10-09 ENCOUNTER — Other Ambulatory Visit: Payer: Self-pay | Admitting: Family Medicine

## 2023-10-19 ENCOUNTER — Other Ambulatory Visit: Payer: Self-pay | Admitting: Family Medicine

## 2023-10-19 DIAGNOSIS — J302 Other seasonal allergic rhinitis: Secondary | ICD-10-CM

## 2023-10-31 ENCOUNTER — Ambulatory Visit
Admission: RE | Admit: 2023-10-31 | Discharge: 2023-10-31 | Disposition: A | Discharge status: Home / Self Care | Payer: Medicare Other | Source: Ambulatory Visit | Attending: Surgery | Admitting: Surgery

## 2023-10-31 DIAGNOSIS — Z139 Encounter for screening, unspecified: Secondary | ICD-10-CM

## 2023-10-31 DIAGNOSIS — Z1231 Encounter for screening mammogram for malignant neoplasm of breast: Secondary | ICD-10-CM | POA: Diagnosis not present

## 2023-12-07 ENCOUNTER — Ambulatory Visit: Payer: Medicare Other

## 2023-12-07 VITALS — Ht 67.0 in | Wt 225.0 lb

## 2023-12-07 DIAGNOSIS — Z0000 Encounter for general adult medical examination without abnormal findings: Secondary | ICD-10-CM

## 2023-12-07 NOTE — Patient Instructions (Addendum)
[name] , Thank you for taking time to come for your Medicare Wellness Visit. I appreciate your ongoing commitment to your health goals. Please review the following plan we discussed and let me know if I can assist you in the future.   Referrals/Orders/Follow-Ups/Clinician Recommendations:   This is a list of the screening recommended for you and due dates:  Health Maintenance  Topic Date Due   Pneumonia Vaccine (1 of 2 - PCV) Never done   Colon Cancer Screening  12/08/2023   Mammogram  10/30/2024   Medicare Annual Wellness Visit  12/06/2024   Flu Shot  Completed   DEXA scan (bone density measurement)  Completed   Hepatitis C Screening  Completed   HPV Vaccine  Aged Out   DTaP/Tdap/Td vaccine  Discontinued   COVID-19 Vaccine  Discontinued   Zoster (Shingles) Vaccine  Discontinued    Advanced directives: (Declined) Advance directive discussed with you today. Even though you declined this today, please call our office should you change your mind, and we can give you the proper paperwork for you to fill out.  Next Medicare Annual Wellness Visit scheduled for next year: Yes

## 2023-12-07 NOTE — Progress Notes (Cosign Needed Addendum)
Subjective:   Jocelyn Ford is a 72 y.o. female who presents for Medicare Annual (Subsequent) preventive examination.  Visit Complete: Virtual I connected with  Jocelyn Ford on 12/07/23 by a audio enabled telemedicine application and verified that I am speaking with the correct person using two identifiers.  Patient Location: Home  Provider Location: Home Office  I discussed the limitations of evaluation and management by telemedicine. The patient expressed understanding and agreed to proceed.  Vital Signs: Because this visit was a virtual/telehealth visit, some criteria may be missing or patient reported. Any vitals not documented were not able to be obtained and vitals that have been documented are patient reported.  Patient Medicare AWV questionnaire was completed by the patient on 12/04/23; I have confirmed that all information answered by patient is correct and no changes since this date.  Cardiac Risk Factors include: advanced age (>61men, >33 women);hypertension     Objective:    Today's Vitals   12/07/23 1620  Weight: 225 lb (102.1 kg)  Height: 5\' 7"  (1.702 m)   Body mass index is 35.24 kg/m.     12/07/2023    4:27 PM 02/09/2023    7:17 AM 02/02/2023   11:26 AM 07/06/2022   12:41 PM 07/03/2021    9:52 AM 07/02/2020    9:09 AM 11/29/2016    4:50 PM  Advanced Directives  Does Patient Have a Medical Advance Directive? No No No No No No No  Would patient like information on creating a medical advance directive? No - Patient declined No - Patient declined No - Patient declined No - Patient declined No - Patient declined No - Patient declined     Current Medications (verified) Outpatient Encounter Medications as of 12/07/2023  Medication Sig   allopurinol (ZYLOPRIM) 100 MG tablet Take 200 mg by mouth daily.   aspirin-acetaminophen-caffeine (EXCEDRIN MIGRAINE) 250-250-65 MG tablet Take 2 tablets by mouth every 6 (six) hours as needed for headache or  migraine.   atenolol (TENORMIN) 25 MG tablet TAKE 1 TABLET BY MOUTH TWICE A DAY   cholecalciferol (VITAMIN D) 25 MCG (1000 UNIT) tablet Take 1,000 Units by mouth daily.   FERROUS SULFATE PO Take 1 tablet by mouth daily.   fluticasone (FLONASE) 50 MCG/ACT nasal spray PLACE 1 SPRAY INTO BOTH NOSTRILS DAILY AS NEEDED FOR ALLERGIES.   furosemide (LASIX) 40 MG tablet TAKE 1 TABLET BY MOUTH DAILY, IF INCREASED EDEMA, MAY TAKE ADDITIONAL 1/2 TAB (20 MG ) FOR THAT DAY. PLEASE NOTIFY OFFICE IF INCREASED EDEMA PERSISTS.   losartan (COZAAR) 100 MG tablet Take 1 tablet (100 mg total) by mouth daily.   potassium chloride SA (KLOR-CON M20) 20 MEQ tablet TAKE 3 TABLETS BY MOUTH 3 TIMES DAILY. -INS WILL ONLY PAY FOR 5 A DAY   No facility-administered encounter medications on file as of 12/07/2023.    Allergies (verified) Doxycycline and Nifedipine   History: Past Medical History:  Diagnosis Date   Acute gouty arthropathy    Allergic rhinitis, cause unspecified    Arthritis    Chronic kidney disease    Complication of anesthesia    hard time waking up   Dizziness and giddiness    Dysuria    Esophageal reflux    Heart murmur    Iron deficiency anemia, unspecified    Nonspecific abnormal electrocardiogram (ECG) (EKG)    Osteoporosis, unspecified    Pure hypercholesterolemia    Routine general medical examination at a health care facility    Unspecified  essential hypertension    Past Surgical History:  Procedure Laterality Date   ABDOMINAL HYSTERECTOMY     BREAST BIOPSY Left 11/04/2022   [place] LT BREAST BX W LOC DEV 1ST LESION IMG BX SPEC US GUIDE 11/04/2022 [facility]   BREAST BIOPSY  02/08/2023   MM LT RADIOACTIVE SEED LOC MAMMO GUIDE 02/08/2023 [facility]   BREAST BIOPSY  02/08/2023   MM RT RADIOACTIVE SEED LOC MAMMO GUIDE 02/08/2023 [facility]   BREAST LUMPECTOMY WITH RADIOACTIVE SEED LOCALIZATION Bilateral 02/09/2023   Procedure: BILATERAL BREAST LUMPECTOMY WITH  RADIOACTIVE SEED LOCALIZATION;  Surgeon: Harriette Bouillon, MD;  Location: [facility];  Service: General;  Laterality: Bilateral;   LEFT HEART CATHETERIZATION WITH CORONARY ANGIOGRAM N/A 01/28/2015   Procedure: LEFT HEART CATHETERIZATION WITH CORONARY ANGIOGRAM;  Surgeon: Donato Schultz, MD;  Location: [facility] CATH LAB;  Service: Cardiovascular;  Laterality: N/A;   Family History  Problem Relation Age of Onset   Alzheimer's disease Mother    Hypertension Mother    Kidney disease Mother    Cancer Father        Prostate   Hypertension Father    Diabetes Sister    Hypertension Sister    Hypertension Sister    Breast cancer Sister 16   Healthy Daughter    Heart Problems Brother        stent   Hypertension Brother    Social History   Socioeconomic History   Marital status: Divorced    Spouse name: n/a   Number of children: 1   Years of education: 13   Highest education level: 12th grade  Occupational History   Occupation: Chief of Staff: [employer]  Tobacco Use   Smoking status: Former    Current packs/day: 0.00    Average packs/day: 1 pack/day for 10.0 years (10.0 ttl pk-yrs)    Types: Cigarettes    Start date: 01/02/1975    Quit date: 01/02/1985    Years since quitting: 38.9   Smokeless tobacco: Never  Vaping Use   Vaping status: Never Used  Substance and Sexual Activity   Alcohol use: No    Alcohol/week: 0.0 standard drinks of alcohol   Drug use: Never   Sexual activity: Never  Other Topics Concern   Not on file  Social History Narrative   Lives alone.  Her daughter lives nearby.   Social Drivers of Corporate investment banker Strain: Low Risk  (12/04/2023)   Overall Financial Resource Strain (CARDIA)    Difficulty of Paying Living Expenses: Not hard at all  Food Insecurity: No Food Insecurity (12/04/2023)   Hunger Vital Sign    Worried About Running Out of Food in the Last Year: Never true    Ran Out of Food in the Last Year: Never  true  Transportation Needs: No Transportation Needs (12/04/2023)   PRAPARE - Administrator, Civil Service (Medical): No    Lack of Transportation (Non-Medical): No  Physical Activity: Insufficiently Active (12/04/2023)   Exercise Vital Sign    Days of Exercise per Week: 1 day    Minutes of Exercise per Session: 20 min  Stress: No Stress Concern Present (12/04/2023)   [employer] of Occupational Health - Occupational Stress Questionnaire    Feeling of Stress : Not at all  Social Connections: Unknown (12/04/2023)   Social Connection and Isolation Panel [NHANES]    Frequency of Communication with Friends and Family: More than three times a week  Frequency of Social Gatherings with Friends and Family: Twice a week    Attends Religious Services: Not on Marketing executive or Organizations: Yes    Attends Banker Meetings: Never    Marital Status: Divorced    Tobacco Counseling Counseling given: Not Answered   Clinical Intake:  Pre-visit preparation completed: Yes  Pain : No/denies pain     BMI - recorded: 35.24 Nutritional Status: BMI > 30  Obese Nutritional Risks: None Diabetes: No  How often do you need to have someone help you when you read instructions, pamphlets, or other written materials from your doctor or pharmacy?: 1 - Never  Interpreter Needed?: No  Information entered by :: Theresa Mulligan LPN   Activities of Daily Living    12/04/2023    8:35 PM 02/09/2023    7:23 AM  In your present state of health, do you have any difficulty performing the following activities:  Hearing? 0 0  Vision? 0 0  Difficulty concentrating or making decisions? 0 0  Walking or climbing stairs? 0 0  Dressing or bathing? 0 0  Doing errands, shopping? 0   Preparing Food and eating ? N   Using the Toilet? N   In the past six months, have you accidently leaked urine? N   Do you have problems with loss of bowel control? N   Managing your  Medications? N   Managing your Finances? N   Housekeeping or managing your Housekeeping? N     Patient Care Team: Deeann Saint, MD as PCP - General (Family Medicine) Pennie Rushing Maris Berger, MD (Inactive) as Consulting Physician (Obstetrics and Gynecology) Dory Larsen, OD (Optometry)  Indicate any recent Medical Services you may have received from other than [facility] providers in the past year (date may be approximate).     Assessment:   This is a routine wellness examination for [place].  Hearing/Vision screen Hearing Screening - Comments:: Denies hearing difficulties   Vision Screening - Comments:: Wears rx glasses - up to date with routine eye exams with  Dr Dione Booze   Goals Addressed               This Visit's Progress     Lose weight (pt-stated)         Depression Screen    12/07/2023    4:24 PM 06/20/2023   10:27 AM 12/29/2022    3:45 PM 08/09/2022    1:13 PM 07/06/2022   12:37 PM 03/10/2022   10:32 AM 12/23/2021   10:33 AM  PHQ 2/9 Scores  PHQ - 2 Score 0 0 0 0 0 0 0  PHQ- 9 Score  0  0  2 0    Fall Risk    12/07/2023    4:27 PM 12/04/2023    8:35 PM 06/20/2023   10:27 AM 08/09/2022    1:13 PM 07/06/2022   12:40 PM  Fall Risk   Falls in the past year? 0 0 0 0 0  Number falls in past yr: 0 0 0 0 0  Injury with Fall? 0 0 0 0 0  Risk for fall due to : No Fall Risks  No Fall Risks No Fall Risks No Fall Risks  Follow up Falls prevention discussed  Falls evaluation completed Falls evaluation completed     MEDICARE RISK AT HOME: Medicare Risk at Home Any stairs in or around the home?: (Patient-Rptd) (P) No If so, are there any without handrails?: No  Home free of loose throw rugs in walkways, pet beds, electrical cords, etc?: (Patient-Rptd) (P) Yes Adequate lighting in your home to reduce risk of falls?: (Patient-Rptd) (P) Yes Life alert?: (Patient-Rptd) (P) No Use of a cane, walker or w/c?: (Patient-Rptd) (P) No Grab bars in the bathroom?: (Patient-Rptd) (P)  No Shower chair or bench in shower?: (Patient-Rptd) (P) No Elevated toilet seat or a handicapped toilet?: (Patient-Rptd) (P) No  TIMED UP AND GO:  Was the test performed?  No    Cognitive Function:        12/07/2023    4:27 PM 07/06/2022   12:41 PM  6CIT Screen  What Year? 0 points 0 points  What month? 0 points 0 points  What time? 0 points 0 points  Count back from 20 0 points 0 points  Months in reverse 0 points 0 points  Repeat phrase 0 points 0 points  Total Score 0 points 0 points    Immunizations Immunization History  Administered Date(s) Administered   Fluad Quad(high Dose 65+) 08/14/2019, 09/07/2022   Influenza Whole 10/20/2009   Influenza, High Dose Seasonal PF 11/02/2018, 10/06/2023   Influenza,inj,Quad PF,6+ Mos 09/27/2014   Influenza-Unspecified 10/20/2016, 08/14/2019, 09/11/2020, 09/16/2021   PFIZER(Purple Top)SARS-COV-2 Vaccination 01/25/2020, 02/15/2020, 09/25/2020   Td 10/17/2008   Zoster Recombinant(Shingrix) 09/07/2022   Zoster, Live 08/20/2015      Flu Vaccine status: Up to date  Pneumococcal vaccine status: Due, Education has been provided regarding the importance of this vaccine. Advised may receive this vaccine at local pharmacy or Health Dept. Aware to provide a copy of the vaccination record if obtained from local pharmacy or Health Dept. Verbalized acceptance and understanding.    Screening Tests Health Maintenance  Topic Date Due   Pneumonia Vaccine 27+ Years old (1 of 2 - PCV) Never done   Colonoscopy  12/08/2023   MAMMOGRAM  10/30/2024   Medicare Annual Wellness (AWV)  12/06/2024   INFLUENZA VACCINE  Completed   DEXA SCAN  Completed   Hepatitis C Screening  Completed   HPV VACCINES  Aged Out   DTaP/Tdap/Td  Discontinued   COVID-19 Vaccine  Discontinued   Zoster Vaccines- Shingrix  Discontinued    Health Maintenance  Health Maintenance Due  Topic Date Due   Pneumonia Vaccine 50+ Years old (1 of 2 - PCV) Never done    Colonoscopy  12/08/2023    Colorectal cancer screening: Type of screening: Colonoscopy. Completed 12/07/13. Repeat every 10 years  Mammogram status: Completed 10/31/23. Repeat every year  Bone Density status: Completed 12/05/20. Results reflect: Bone density results: NORMAL. Repeat every   years.   Additional Screening:  Hepatitis C Screening: does qualify; Completed 06/20/23  Vision Screening: Recommended annual ophthalmology exams for early detection of glaucoma and other disorders of the eye. Is the patient up to date with their annual eye exam?  Yes  Who is the provider or what is the name of the office in which the patient attends annual eye exams? Dr Dione Booze If pt is not established with a provider, would they like to be referred to a provider to establish care? No .   Dental Screening: Recommended annual dental exams for proper oral hygiene    Community Resource Referral / Chronic Care Management:  CRR required this visit?  No   CCM required this visit?  No     Plan:     I have personally reviewed and noted the following in the patient's chart:   Medical and social history  Use of alcohol, tobacco or illicit drugs  Current medications and supplements including opioid prescriptions. Patient is not currently taking opioid prescriptions. Functional ability and status Nutritional status Physical activity Advanced directives List of other physicians Hospitalizations, surgeries, and ER visits in previous 12 months Vitals Screenings to include cognitive, depression, and falls Referrals and appointments  In addition, I have reviewed and discussed with patient certain preventive protocols, quality metrics, and best practice recommendations. A written personalized care plan for preventive services as well as general preventive health recommendations were provided to patient.     Tillie Rung, LPN   [id number]   After Visit Summary: (MyChart) Due to this being a  telephonic visit, the after visit summary with patients personalized plan was offered to patient via MyChart   Nurse Notes: None

## 2023-12-27 ENCOUNTER — Encounter: Payer: Self-pay | Admitting: Family Medicine

## 2023-12-28 NOTE — Telephone Encounter (Signed)
 It is fine

## 2024-02-07 DIAGNOSIS — L218 Other seborrheic dermatitis: Secondary | ICD-10-CM | POA: Diagnosis not present

## 2024-03-15 DIAGNOSIS — I10 Essential (primary) hypertension: Secondary | ICD-10-CM | POA: Diagnosis not present

## 2024-03-15 DIAGNOSIS — K5904 Chronic idiopathic constipation: Secondary | ICD-10-CM | POA: Diagnosis not present

## 2024-03-15 DIAGNOSIS — K219 Gastro-esophageal reflux disease without esophagitis: Secondary | ICD-10-CM | POA: Diagnosis not present

## 2024-03-15 DIAGNOSIS — Z1211 Encounter for screening for malignant neoplasm of colon: Secondary | ICD-10-CM | POA: Diagnosis not present

## 2024-03-25 ENCOUNTER — Other Ambulatory Visit: Payer: Self-pay | Admitting: Family Medicine

## 2024-03-25 DIAGNOSIS — Z760 Encounter for issue of repeat prescription: Secondary | ICD-10-CM

## 2024-03-26 ENCOUNTER — Telehealth: Payer: Self-pay

## 2024-03-26 DIAGNOSIS — Z760 Encounter for issue of repeat prescription: Secondary | ICD-10-CM

## 2024-03-26 MED ORDER — POTASSIUM CHLORIDE CRYS ER 20 MEQ PO TBCR: EXTENDED_RELEASE_TABLET | ORAL | 2 refills | Status: DC

## 2024-03-26 NOTE — Telephone Encounter (Signed)
 Medication has been sent to the pharmacy.

## 2024-03-26 NOTE — Addendum Note (Signed)
 Addended by: Philipp Deputy A on: 03/26/2024 04:18 PM   Modules accepted: Orders

## 2024-03-26 NOTE — Telephone Encounter (Signed)
 Copied from CRM [id number]. Topic: General - Other >> Mar 26, 2024  3:57 PM Fredrich Romans wrote: Reason for CRM: Patient is scheduled for her CPE after July 1,2025 ,when she is due.She would like to know if she could have her refill of potassium chloride SA (KLOR-CON M20) 20 MEQ tablet or if an appointment is still required.She was only advised to schedule a physical.

## 2024-03-28 DIAGNOSIS — M109 Gout, unspecified: Secondary | ICD-10-CM | POA: Diagnosis not present

## 2024-03-28 DIAGNOSIS — D509 Iron deficiency anemia, unspecified: Secondary | ICD-10-CM | POA: Diagnosis not present

## 2024-03-28 DIAGNOSIS — E213 Hyperparathyroidism, unspecified: Secondary | ICD-10-CM | POA: Diagnosis not present

## 2024-03-28 DIAGNOSIS — N183 Chronic kidney disease, stage 3 unspecified: Secondary | ICD-10-CM | POA: Diagnosis not present

## 2024-03-28 DIAGNOSIS — I129 Hypertensive chronic kidney disease with stage 1 through stage 4 chronic kidney disease, or unspecified chronic kidney disease: Secondary | ICD-10-CM | POA: Diagnosis not present

## 2024-03-28 DIAGNOSIS — N390 Urinary tract infection, site not specified: Secondary | ICD-10-CM | POA: Diagnosis not present

## 2024-03-28 DIAGNOSIS — R7303 Prediabetes: Secondary | ICD-10-CM | POA: Diagnosis not present

## 2024-03-29 LAB — LAB REPORT - SCANNED
Creatinine, POC: 48.8 mg/dL
EGFR: 35

## 2024-04-26 DIAGNOSIS — I129 Hypertensive chronic kidney disease with stage 1 through stage 4 chronic kidney disease, or unspecified chronic kidney disease: Secondary | ICD-10-CM | POA: Diagnosis not present

## 2024-06-21 ENCOUNTER — Encounter: Admitting: Family Medicine

## 2024-06-22 ENCOUNTER — Other Ambulatory Visit: Payer: Self-pay | Admitting: Family Medicine

## 2024-06-22 DIAGNOSIS — Z760 Encounter for issue of repeat prescription: Secondary | ICD-10-CM

## 2024-07-05 ENCOUNTER — Encounter: Payer: Self-pay | Admitting: Family Medicine

## 2024-07-05 ENCOUNTER — Ambulatory Visit (INDEPENDENT_AMBULATORY_CARE_PROVIDER_SITE_OTHER): Admitting: Family Medicine

## 2024-07-05 VITALS — BP 138/78 | HR 76 | Temp 98.2°F | Ht 67.0 in | Wt 233.2 lb

## 2024-07-05 DIAGNOSIS — L659 Nonscarring hair loss, unspecified: Secondary | ICD-10-CM | POA: Diagnosis not present

## 2024-07-05 DIAGNOSIS — L309 Dermatitis, unspecified: Secondary | ICD-10-CM

## 2024-07-05 DIAGNOSIS — N1832 Chronic kidney disease, stage 3b: Secondary | ICD-10-CM

## 2024-07-05 DIAGNOSIS — E782 Mixed hyperlipidemia: Secondary | ICD-10-CM | POA: Diagnosis not present

## 2024-07-05 DIAGNOSIS — I10 Essential (primary) hypertension: Secondary | ICD-10-CM

## 2024-07-05 DIAGNOSIS — E66812 Obesity, class 2: Secondary | ICD-10-CM | POA: Diagnosis not present

## 2024-07-05 DIAGNOSIS — Z0000 Encounter for general adult medical examination without abnormal findings: Secondary | ICD-10-CM | POA: Diagnosis not present

## 2024-07-05 DIAGNOSIS — Z6836 Body mass index (BMI) 36.0-36.9, adult: Secondary | ICD-10-CM

## 2024-07-05 DIAGNOSIS — L819 Disorder of pigmentation, unspecified: Secondary | ICD-10-CM

## 2024-07-05 LAB — COMPREHENSIVE METABOLIC PANEL WITH GFR
ALT: 10 U/L (ref 0–35)
AST: 16 U/L (ref 0–37)
Albumin: 4.2 g/dL (ref 3.5–5.2)
Alkaline Phosphatase: 74 U/L (ref 39–117)
BUN: 20 mg/dL (ref 6–23)
CO2: 29 meq/L (ref 19–32)
Calcium: 10.1 mg/dL (ref 8.4–10.5)
Chloride: 100 meq/L (ref 96–112)
Creatinine, Ser: 1.73 mg/dL — ABNORMAL HIGH (ref 0.40–1.20)
GFR: 29.38 mL/min — ABNORMAL LOW (ref >60.00)
Glucose, Bld: 95 mg/dL (ref 70–99)
Potassium: 3.8 meq/L (ref 3.5–5.1)
Sodium: 138 meq/L (ref 135–145)
Total Bilirubin: 0.4 mg/dL (ref 0.2–1.2)
Total Protein: 7.8 g/dL (ref 6.0–8.3)

## 2024-07-05 LAB — LIPID PANEL
Cholesterol: 208 mg/dL — ABNORMAL HIGH (ref 0–200)
HDL: 38.2 mg/dL — ABNORMAL LOW (ref >39.00)
LDL Cholesterol: 148 mg/dL — ABNORMAL HIGH (ref 0–99)
NonHDL: 170.04
Total CHOL/HDL Ratio: 5
Triglycerides: 110 mg/dL (ref 0.0–149.0)
VLDL: 22 mg/dL (ref 0.0–40.0)

## 2024-07-05 LAB — T4, FREE: Free T4: 0.71 ng/dL (ref 0.60–1.60)

## 2024-07-05 LAB — CBC WITH DIFFERENTIAL/PLATELET
Basophils Absolute: 0 K/uL (ref 0.0–0.1)
Basophils Relative: 0.5 % (ref 0.0–3.0)
Eosinophils Absolute: 0.4 K/uL (ref 0.0–0.7)
Eosinophils Relative: 9 % — ABNORMAL HIGH (ref 0.0–5.0)
HCT: 36.9 % (ref 36.0–46.0)
Hemoglobin: 12 g/dL (ref 12.0–15.0)
Lymphocytes Relative: 39.9 % (ref 12.0–46.0)
Lymphs Abs: 1.7 K/uL (ref 0.7–4.0)
MCHC: 32.5 g/dL (ref 30.0–36.0)
MCV: 87.9 fl (ref 78.0–100.0)
Monocytes Absolute: 0.3 K/uL (ref 0.1–1.0)
Monocytes Relative: 8 % (ref 3.0–12.0)
Neutro Abs: 1.8 K/uL (ref 1.4–7.7)
Neutrophils Relative %: 42.6 % — ABNORMAL LOW (ref 43.0–77.0)
Platelets: 306 K/uL (ref 150.0–400.0)
RBC: 4.2 Mil/uL (ref 3.87–5.11)
RDW: 14.3 % (ref 11.5–15.5)
WBC: 4.3 K/uL (ref 4.0–10.5)

## 2024-07-05 LAB — VITAMIN D 25 HYDROXY (VIT D DEFICIENCY, FRACTURES): VITD: 39.2 ng/mL (ref 30.00–100.00)

## 2024-07-05 LAB — TSH: TSH: 2.41 u[IU]/mL (ref 0.35–5.50)

## 2024-07-05 LAB — VITAMIN B12: Vitamin B-12: 371 pg/mL (ref 211–911)

## 2024-07-05 LAB — HEMOGLOBIN A1C: Hgb A1c MFr Bld: 6.3 % (ref 4.6–6.5)

## 2024-07-05 NOTE — Progress Notes (Unsigned)
 Established Patient Office Visit   Subjective  Patient ID: Jocelyn Ford, female    DOB: [birth year]  Age: 73 y.o. MRN: [id number]  Chief Complaint  Patient presents with  . Annual Exam    Patient would like a Dermatology referral for hair loss and discoloration on face     Patient is a 73 year old female seen for CPE.  Patient doing well overall.  Requesting referral to dermatology for hypopigmentation of face and hair thinning.      Patient Active Problem List   Diagnosis Date Noted  . Insomnia 07/12/2022  . OSA (obstructive sleep apnea) 04/13/2022  . Pulmonary nodule less than 6 mm determined by computed tomography of lung 12/25/2021  . Primary osteoarthritis of both hands 10/31/2018  . Primary osteoarthritis of both feet 10/31/2018  . Leg swelling 08/01/2018  . Paresthesia 04/24/2018  . Asymptomatic menopausal state 12/11/2016  . Hyperparathyroidism (HCC) 11/29/2016  . Dyspnea 08/18/2016  . Chronic tension-type headache, not intractable 01/30/2016  . Dizziness and giddiness 01/07/2016  . Headache 01/07/2016  . Fatigue 01/07/2016  . Renal insufficiency 08/20/2015  . Abnormal stress test   . Chest pain 09/27/2014  . Menopausal state 09/27/2014  . Gout 08/08/2014  . Routine general medical examination at a health care facility 10/03/2013  . Hypokalemia 10/03/2013  . Encounter for long-term (current) use of other medications 08/21/2013  . Idiopathic chronic gout of multiple sites without tophus 07/18/2009  . Anemia, iron deficiency 11/07/2008  . HYPERCHOLESTEROLEMIA 11/01/2008  . ELECTROCARDIOGRAM, ABNORMAL 11/01/2008  . Essential hypertension 07/28/2007  . ALLERGIC RHINITIS 07/28/2007  . GERD 07/28/2007  . Osteoporosis 07/28/2007   Past Medical History:  Diagnosis Date  . Acute gouty arthropathy   . Allergic rhinitis, cause unspecified   . Allergy 1986  . Arthritis   . Chronic kidney disease   . Complication of anesthesia    hard time waking up  .  Dizziness and giddiness   . Dysuria   . Esophageal reflux   . Heart murmur   . Iron deficiency anemia, unspecified   . Nonspecific abnormal electrocardiogram (ECG) (EKG)   . Osteoporosis, unspecified   . Pure hypercholesterolemia   . Routine general medical examination at a health care facility   . Sleep apnea   . Unspecified essential hypertension    Past Surgical History:  Procedure Laterality Date  . ABDOMINAL HYSTERECTOMY    . BREAST BIOPSY Left 11/04/2022   US  LT BREAST BX W LOC DEV 1ST LESION IMG BX SPEC US  GUIDE 11/04/2022 [facility]  . BREAST BIOPSY  02/08/2023   MM LT RADIOACTIVE SEED LOC MAMMO GUIDE 02/08/2023 [facility]  . BREAST BIOPSY  02/08/2023   MM RT RADIOACTIVE SEED LOC MAMMO GUIDE 02/08/2023 [facility]  . BREAST LUMPECTOMY WITH RADIOACTIVE SEED LOCALIZATION Bilateral 02/09/2023   Procedure: BILATERAL BREAST LUMPECTOMY WITH RADIOACTIVE SEED LOCALIZATION;  Surgeon: Vanderbilt Ned, MD;  Location: [facility];  Service: General;  Laterality: Bilateral;  . EYE SURGERY  12/2021   Cataracts  . LEFT HEART CATHETERIZATION WITH CORONARY ANGIOGRAM N/A 01/28/2015   Procedure: LEFT HEART CATHETERIZATION WITH CORONARY ANGIOGRAM;  Surgeon: Oneil Parchment, MD;  Location: [facility] CATH [facility];  Service: Cardiovascular;  Laterality: N/A;   Social History   Tobacco Use  . Smoking status: Former    Current packs/day: 0.00    Average packs/day: 1 pack/day for 10.0 years (10.0 ttl pk-yrs)    Types: Cigarettes    Start date: 01/02/1975    Quit  date: 01/02/1985    Years since quitting: 39.5  . Smokeless tobacco: Never  Vaping Use  . Vaping status: Never Used  Substance Use Topics  . Alcohol use: No    Alcohol/week: 0.0 standard drinks of alcohol  . Drug use: Never   Family History  Problem Relation Age of Onset  . Alzheimer's disease Mother   . Hypertension Mother   . Kidney disease Mother   . Cancer Father        Prostate  . Hypertension Father    . Arthritis Father   . Diabetes Sister   . Hypertension Sister   . Hypertension Sister   . Breast cancer Sister 61  . Healthy Daughter   . Heart Problems Brother        stent  . Hypertension Brother    Allergies  Allergen Reactions  . Doxycycline    . Nifedipine     REACTION: Nausea,weakness    ROS Negative unless stated above    Objective:     BP (!) 150/78 (BP Location: Left Arm, Patient Position: Sitting, Cuff Size: Large)   Pulse 76   Temp 98.2 F (36.8 C) (Oral)   Ht 5' 7 (1.702 m)   Wt 233 lb 3.2 oz (105.8 kg)   SpO2 99%   BMI 36.52 kg/m  BP Readings from Last 3 Encounters:  07/05/24 (!) 150/78  06/20/23 126/82  03/18/23 (!) 160/79   Wt Readings from Last 3 Encounters:  07/05/24 233 lb 3.2 oz (105.8 kg)  12/07/23 225 lb (102.1 kg)  06/20/23 225 lb 8 oz (102.3 kg)      Physical Exam Constitutional:      Appearance: Normal appearance.  HENT:     Head: Normocephalic and atraumatic.     Right Ear: Tympanic membrane, ear canal and external ear normal.     Left Ear: Tympanic membrane, ear canal and external ear normal.     Nose: Nose normal.     Mouth/Throat:     Mouth: Mucous membranes are moist.     Pharynx: No oropharyngeal exudate or posterior oropharyngeal erythema.  Eyes:     General: No scleral icterus.    Extraocular Movements: Extraocular movements intact.     Conjunctiva/sclera: Conjunctivae normal.     Pupils: Pupils are equal, round, and reactive to light.  Neck:     Thyroid : No thyromegaly.  Cardiovascular:     Rate and Rhythm: Normal rate and regular rhythm.     Pulses: Normal pulses.     Heart sounds: Normal heart sounds. No murmur heard.    No friction rub.  Pulmonary:     Effort: Pulmonary effort is normal.     Breath sounds: Normal breath sounds. No wheezing, rhonchi or rales.  Abdominal:     General: Bowel sounds are normal.     Palpations: Abdomen is soft.     Tenderness: There is no abdominal tenderness.   Musculoskeletal:        General: No deformity. Normal range of motion.  Lymphadenopathy:     Cervical: No cervical adenopathy.  Skin:    General: Skin is warm and dry.     Findings: No lesion.     Comments: Thinning hair.  No central balding.  Hyperpigmentation on face.  Neurological:     General: No focal deficit present.     Mental Status: She is alert and oriented to person, place, and time.  Psychiatric:        Mood and Affect: Mood  normal.        Thought Content: Thought content normal.        07/05/2024   10:32 AM 12/07/2023    4:24 PM 06/20/2023   10:27 AM  Depression screen PHQ 2/9  Decreased Interest 0 0 0  Down, Depressed, Hopeless 0 0 0  PHQ - 2 Score 0 0 0  Altered sleeping 0  0  Tired, decreased energy 0  0  Change in appetite 1  0  Feeling bad or failure about yourself  0  0  Trouble concentrating 0  0  Moving slowly or fidgety/restless 0  0  Suicidal thoughts 0  0  PHQ-9 Score 1  0  Difficult doing work/chores   Not difficult at all      07/05/2024   10:32 AM 06/20/2023   10:27 AM  GAD 7 : Generalized Anxiety Score  Nervous, Anxious, on Edge 0 0  Control/stop worrying 0 0  Worry too much - different things 0 0  Trouble relaxing 0 0  Restless 0 0  Easily annoyed or irritable 0 0  Afraid - awful might happen 0 0  Total GAD 7 Score 0 0  Anxiety Difficulty Not difficult at all Not difficult at all     No results found for any visits on 07/05/24.    Assessment & Plan:   Well adult exam -     CBC with Differential/Platelet; Future -     Comprehensive metabolic panel with GFR; Future -     Lipid panel; Future -     Hemoglobin A1c; Future -     T4, free; Future -     TSH; Future  Essential hypertension -     Comprehensive metabolic panel with GFR; Future -     T4, free; Future -     TSH; Future  Mixed hyperlipidemia -     Lipid panel; Future  Stage 3b chronic kidney disease (HCC) -     Comprehensive metabolic panel with GFR; Future -      VITAMIN D  25 Hydroxy (Vit-D Deficiency, Fractures); Future  Eczema, unspecified type -     Ambulatory referral to Dermatology  Hair loss -     CBC with Differential/Platelet; Future -     T4, free; Future -     TSH; Future -     Vitamin B12; Future -     VITAMIN D  25 Hydroxy (Vit-D Deficiency, Fractures); Future -     Ambulatory referral to Dermatology  Class 2 severe obesity with serious comorbidity and body mass index (BMI) of 36.0 to 36.9 in adult, unspecified obesity type (HCC) -     Comprehensive metabolic panel with GFR; Future -     Lipid panel; Future -     Hemoglobin A1c; Future -     T4, free; Future -     TSH; Future -     VITAMIN D  25 Hydroxy (Vit-D Deficiency, Fractures); Future  Hyperpigmentation -     Ambulatory referral to Dermatology    No follow-ups on file.   Clotilda JONELLE Single, MD

## 2024-07-27 ENCOUNTER — Ambulatory Visit: Payer: Self-pay | Admitting: Family Medicine

## 2024-08-06 ENCOUNTER — Telehealth: Payer: Self-pay

## 2024-08-06 NOTE — Telephone Encounter (Signed)
 Copied from CRM #[id number]. Topic: Clinical - Lab/Test Results >> Aug 06, 2024 11:25 AM Delon DASEN wrote: Reason for CRM: wants to discuss labs and colonoscopy- please call [phone]

## 2024-08-06 NOTE — Telephone Encounter (Signed)
 Called patient, she has a Colonoscopy and had question about the prep and Kidney damage, I advised patient to call GI, because they might be able to change Medication.

## 2024-08-13 DIAGNOSIS — D120 Benign neoplasm of cecum: Secondary | ICD-10-CM | POA: Diagnosis not present

## 2024-08-13 DIAGNOSIS — Z1211 Encounter for screening for malignant neoplasm of colon: Secondary | ICD-10-CM | POA: Diagnosis not present

## 2024-08-13 DIAGNOSIS — K635 Polyp of colon: Secondary | ICD-10-CM | POA: Diagnosis not present

## 2024-08-13 LAB — HM COLONOSCOPY

## 2024-08-16 ENCOUNTER — Other Ambulatory Visit: Payer: Self-pay | Admitting: Family Medicine

## 2024-08-16 DIAGNOSIS — I10 Essential (primary) hypertension: Secondary | ICD-10-CM

## 2024-08-28 DIAGNOSIS — I129 Hypertensive chronic kidney disease with stage 1 through stage 4 chronic kidney disease, or unspecified chronic kidney disease: Secondary | ICD-10-CM | POA: Diagnosis not present

## 2024-08-28 DIAGNOSIS — N183 Chronic kidney disease, stage 3 unspecified: Secondary | ICD-10-CM | POA: Diagnosis not present

## 2024-08-28 DIAGNOSIS — R7303 Prediabetes: Secondary | ICD-10-CM | POA: Diagnosis not present

## 2024-08-28 DIAGNOSIS — D509 Iron deficiency anemia, unspecified: Secondary | ICD-10-CM | POA: Diagnosis not present

## 2024-08-28 DIAGNOSIS — E213 Hyperparathyroidism, unspecified: Secondary | ICD-10-CM | POA: Diagnosis not present

## 2024-08-28 DIAGNOSIS — G4733 Obstructive sleep apnea (adult) (pediatric): Secondary | ICD-10-CM | POA: Diagnosis not present

## 2024-08-28 DIAGNOSIS — M109 Gout, unspecified: Secondary | ICD-10-CM | POA: Diagnosis not present

## 2024-08-29 LAB — LAB REPORT - SCANNED
Albumin, Urine POC: <3
Albumin/Creatinine Ratio, Urine, POC: <14
Creatinine, POC: 20.9 mg/dL
EGFR: 33

## 2024-09-17 ENCOUNTER — Other Ambulatory Visit: Payer: Self-pay | Admitting: Family Medicine

## 2024-09-17 DIAGNOSIS — Z1231 Encounter for screening mammogram for malignant neoplasm of breast: Secondary | ICD-10-CM

## 2024-10-04 ENCOUNTER — Other Ambulatory Visit: Payer: Self-pay | Admitting: Family Medicine

## 2024-10-04 DIAGNOSIS — I129 Hypertensive chronic kidney disease with stage 1 through stage 4 chronic kidney disease, or unspecified chronic kidney disease: Secondary | ICD-10-CM

## 2024-11-01 ENCOUNTER — Ambulatory Visit
Admission: RE | Admit: 2024-11-01 | Discharge: 2024-11-01 | Disposition: A | Source: Ambulatory Visit | Attending: Family Medicine

## 2024-11-01 DIAGNOSIS — Z1231 Encounter for screening mammogram for malignant neoplasm of breast: Secondary | ICD-10-CM

## 2024-11-06 ENCOUNTER — Other Ambulatory Visit: Payer: Self-pay | Admitting: Family Medicine

## 2024-11-06 DIAGNOSIS — R928 Other abnormal and inconclusive findings on diagnostic imaging of breast: Secondary | ICD-10-CM

## 2024-11-19 ENCOUNTER — Ambulatory Visit
Admission: RE | Admit: 2024-11-19 | Discharge: 2024-11-19 | Disposition: A | Source: Ambulatory Visit | Attending: Family Medicine

## 2024-11-19 DIAGNOSIS — R928 Other abnormal and inconclusive findings on diagnostic imaging of breast: Secondary | ICD-10-CM

## 2024-12-10 ENCOUNTER — Ambulatory Visit: Payer: Medicare Other

## 2024-12-10 VITALS — BP 122/62 | HR 93 | Temp 98.0°F | Ht 67.0 in | Wt 227.7 lb

## 2024-12-10 DIAGNOSIS — Z0000 Encounter for general adult medical examination without abnormal findings: Secondary | ICD-10-CM | POA: Diagnosis not present

## 2024-12-10 NOTE — Patient Instructions (Addendum)
 [name],  Thank you for taking the time for your Medicare Wellness Visit. I appreciate your continued commitment to your health goals. Please review the care plan we discussed, and feel free to reach out if I can assist you further.  Please note that Annual Wellness Visits do not include a physical exam. Some assessments may be limited, especially if the visit was conducted virtually. If needed, we may recommend an in-person follow-up with your provider.  Ongoing Care Seeing your primary care provider every 3 to 6 months helps us  monitor your health and provide consistent, personalized care.   Referrals If a referral was made during today's visit and you haven't received any updates within two weeks, please contact the referred provider directly to check on the status.  Recommended Screenings:  Health Maintenance  Topic Date Due   Breast Cancer Screening  11/01/2025   Medicare Annual Wellness Visit  12/10/2025   Colon Cancer Screening  08/13/2034   Pneumococcal Vaccine for age over 54  Completed   Flu Shot  Completed   Osteoporosis screening with Bone Density Scan  Completed   Hepatitis C Screening  Completed   Meningitis B Vaccine  Aged Out   DTaP/Tdap/Td vaccine  Discontinued   COVID-19 Vaccine  Discontinued   Zoster (Shingles) Vaccine  Discontinued       12/10/2024    9:02 AM  Advanced Directives  Does Patient Have a Medical Advance Directive? No  Would patient like information on creating a medical advance directive? No - Patient declined    Vision: Annual vision screenings are recommended for early detection of glaucoma, cataracts, and diabetic retinopathy. These exams can also reveal signs of chronic conditions such as diabetes and high blood pressure.  Dental: Annual dental screenings help detect early signs of oral cancer, gum disease, and other conditions linked to overall health, including heart disease and diabetes.  Please see the attached documents for  additional preventive care recommendations.

## 2024-12-10 NOTE — Progress Notes (Signed)
 "  Chief Complaint  Patient presents with   Medicare Wellness     Subjective:   Jocelyn Ford is a 73 y.o. female who presents for a Medicare Annual Wellness Visit.  Visit info / Clinical Intake: Medicare Wellness Visit Type:: Subsequent Annual Wellness Visit Persons participating in visit and providing information:: patient Medicare Wellness Visit Mode:: In-person (required for WTM) Interpreter Needed?: No Pre-visit prep was completed: yes AWV questionnaire completed by patient prior to visit?: yes Date:: 12/06/24 Living arrangements:: (!) lives alone Patient's Overall Health Status Rating: good Typical amount of pain: none Does pain affect daily life?: no Are you currently prescribed opioids?: no  Dietary Habits and Nutritional Risks How many meals a day?: 3 Eats fruit and vegetables daily?: yes Most meals are obtained by: preparing own meals In the last 2 weeks, have you had any of the following?: none Diabetic:: no  Functional Status Activities of Daily Living (to include ambulation/medication): Independent Ambulation: Independent with device- listed below Home Assistive Devices/Equipment: Eyeglasses; Dentures (specify type) Medication Administration: Independent Home Management (perform basic housework or laundry): Independent Manage your own finances?: yes Primary transportation is: driving Concerns about vision?: no *vision screening is required for WTM* Concerns about hearing?: no  Fall Screening Falls in the past year?: 0 Number of falls in past year: 0 Was there an injury with Fall?: 0 Fall Risk Category Calculator: 0 Patient Fall Risk Level: Low Fall Risk  Fall Risk Patient at Risk for Falls Due to: No Fall Risks Fall risk Follow up: Falls evaluation completed  Home and Transportation Safety: All rugs have non-skid backing?: yes All stairs or steps have railings?: N/A, no stairs Grab bars in the bathtub or shower?: (!) no Have non-skid surface  in bathtub or shower?: yes Good home lighting?: yes Regular seat belt use?: yes Hospital stays in the last year:: no  Cognitive Assessment Difficulty concentrating, remembering, or making decisions? : no Will 6CIT or Mini Cog be Completed: yes What year is it?: 0 points What month is it?: 0 points Give patient an address phrase to remember (5 components): [address] [place]  About what time is it?: 0 points Count backwards from 20 to 1: 0 points Say the months of the year in reverse: 0 points Repeat the address phrase from earlier: 0 points 6 CIT Score: 0 points  Advance Directives (For Healthcare) Does Patient Have a Medical Advance Directive?: No Would patient like information on creating a medical advance directive?: No - Patient declined  Reviewed/Updated  Reviewed/Updated: Reviewed All (Medical, Surgical, Family, Medications, Allergies, Care Teams, Patient Goals)    Allergies (verified) Doxycycline  and Nifedipine   Current Medications (verified) Outpatient Encounter Medications as of 12/10/2024  Medication Sig   allopurinol  (ZYLOPRIM ) 100 MG tablet Take 200 mg by mouth daily.   aspirin -acetaminophen -caffeine (EXCEDRIN MIGRAINE) 250-250-65 MG tablet Take 2 tablets by mouth every 6 (six) hours as needed for headache or migraine.   atenolol  (TENORMIN ) 25 MG tablet TAKE 1 TABLET BY MOUTH TWICE A DAY (Patient taking differently: Take 50 mg by mouth 2 (two) times daily.)   fluticasone  (FLONASE ) 50 MCG/ACT nasal spray PLACE 1 SPRAY INTO BOTH NOSTRILS DAILY AS NEEDED FOR ALLERGIES.   furosemide  (LASIX ) 40 MG tablet TAKE 1 TABLET BY MOUTH DAILY, IF INCREASED EDEMA, MAY TAKE ADDITIONAL 1/2 TAB (20 MG ) FOR THAT DAY. PLEASE NOTIFY OFFICE IF INCREASED EDEMA PERSISTS.   losartan  (COZAAR ) 100 MG tablet TAKE 1 TABLET BY MOUTH EVERY DAY   potassium  chloride SA (KLOR-CON  M) 20 MEQ tablet TAKE 3 TABLETS BY MOUTH 3 TIMES DAILY. -INS WILL ONLY PAY FOR 5 A DAY   No  facility-administered encounter medications on file as of 12/10/2024.    History: Past Medical History:  Diagnosis Date   Acute gouty arthropathy    Allergic rhinitis, cause unspecified    Allergy 1986   Arthritis    Chronic kidney disease    Complication of anesthesia    hard time waking up   Dizziness and giddiness    Dysuria    Esophageal reflux    Heart murmur    Iron deficiency anemia, unspecified    Nonspecific abnormal electrocardiogram (ECG) (EKG)    Osteoporosis, unspecified    Pure hypercholesterolemia    Routine general medical examination at a health care facility    Sleep apnea    Unspecified essential hypertension    Past Surgical History:  Procedure Laterality Date   ABDOMINAL HYSTERECTOMY     BREAST BIOPSY Left 11/04/2022   US  LT BREAST BX W LOC DEV 1ST LESION IMG BX SPEC US  GUIDE 11/04/2022 [facility]   BREAST BIOPSY  02/08/2023   MM LT RADIOACTIVE SEED LOC MAMMO GUIDE 02/08/2023 [facility]   BREAST BIOPSY  02/08/2023   MM RT RADIOACTIVE SEED LOC MAMMO GUIDE 02/08/2023 [facility]   BREAST LUMPECTOMY WITH RADIOACTIVE SEED LOCALIZATION Bilateral 02/09/2023   Procedure: BILATERAL BREAST LUMPECTOMY WITH RADIOACTIVE SEED LOCALIZATION;  Surgeon: Vanderbilt Ned, MD;  Location: [facility];  Service: General;  Laterality: Bilateral;   EYE SURGERY  12/2021   Cataracts   LEFT HEART CATHETERIZATION WITH CORONARY ANGIOGRAM N/A 01/28/2015   Procedure: LEFT HEART CATHETERIZATION WITH CORONARY ANGIOGRAM;  Surgeon: Oneil Parchment, MD;  Location: [facility] CATH LAB;  Service: Cardiovascular;  Laterality: N/A;   Family History  Problem Relation Age of Onset   Alzheimer's disease Mother    Hypertension Mother    Kidney disease Mother    Cancer Father        Prostate   Hypertension Father    Arthritis Father    Diabetes Sister    Hypertension Sister    Hypertension Sister    Breast cancer Sister 84   Healthy Daughter    Heart Problems  Brother        stent   Hypertension Brother    Social History   Occupational History   Occupation: Chief Of Staff: [employer]  Tobacco Use   Smoking status: Former    Current packs/day: 0.00    Average packs/day: 1 pack/day for 10.0 years (10.0 ttl pk-yrs)    Types: Cigarettes    Start date: 01/02/1975    Quit date: 01/02/1985    Years since quitting: 39.9   Smokeless tobacco: Never  Vaping Use   Vaping status: Never Used  Substance and Sexual Activity   Alcohol use: No    Alcohol/week: 0.0 standard drinks of alcohol   Drug use: Never   Sexual activity: Never   Tobacco Counseling Counseling given: No  SDOH Screenings   Food Insecurity: No Food Insecurity (12/10/2024)  Housing: Unknown (12/10/2024)  Transportation Needs: No Transportation Needs (12/10/2024)  Utilities: Not At Risk (12/10/2024)  Alcohol Screen: Low Risk (12/04/2023)  Depression (PHQ2-9): Low Risk (12/10/2024)  Financial Resource Strain: Low Risk (12/06/2024)  Physical Activity: Insufficiently Active (12/10/2024)  Social Connections: Moderately Integrated (12/10/2024)  Stress: No Stress Concern Present (12/10/2024)  Tobacco Use: Medium Risk (12/10/2024)  Health Literacy: Adequate Health  Literacy (12/10/2024)   See flowsheets for full screening details  Depression Screen PHQ 2 & 9 Depression Scale- Over the past 2 weeks, how often have you been bothered by any of the following problems? Little interest or pleasure in doing things: 0 Feeling down, depressed, or hopeless (PHQ Adolescent also includes...irritable): 0 PHQ-2 Total Score: 0 Trouble falling or staying asleep, or sleeping too much: 0 Feeling tired or having little energy: 0 Poor appetite or overeating (PHQ Adolescent also includes...weight loss): 1 Feeling bad about yourself - or that you are a failure or have let yourself or your family down: 0 Trouble concentrating on things, such as reading the newspaper or watching  television (PHQ Adolescent also includes...like school work): 0 Moving or speaking so slowly that other people could have noticed. Or the opposite - being so fidgety or restless that you have been moving around a lot more than usual: 0 Thoughts that you would be better off dead, or of hurting yourself in some way: 0 PHQ-9 Total Score: 1     Goals Addressed               This Visit's Progress     Continue physical activity (pt-stated)        Lose weight.             Objective:    Today's Vitals   12/10/24 0853  BP: 122/62  Pulse: 93  Temp: 98 F (36.7 C)  TempSrc: Oral  SpO2: 97%  Weight: 227 lb 11.2 oz (103.3 kg)  Height: 5' 7 (1.702 m)   Body mass index is 35.66 kg/m.  Hearing/Vision screen Hearing Screening - Comments:: Denies hearing difficulties   Vision Screening - Comments:: Wears rx glasses - up to date with routine eye exams with  Dr Octavia Immunizations and Health Maintenance Health Maintenance  Topic Date Due   Mammogram  11/01/2025   Medicare Annual Wellness (AWV)  12/10/2025   Colonoscopy  08/13/2034   Pneumococcal Vaccine: 50+ Years  Completed   Influenza Vaccine  Completed   Bone Density Scan  Completed   Hepatitis C Screening  Completed   Meningococcal B Vaccine  Aged Out   DTaP/Tdap/Td  Discontinued   COVID-19 Vaccine  Discontinued   Zoster Vaccines- Shingrix  Discontinued        Assessment/Plan:  This is a routine wellness examination for Alfie.  Patient Care Team: Mercer Clotilda SAUNDERS, MD as PCP - General (Family Medicine) Raeanne, Shanda SQUIBB, MD (Inactive) as Consulting Physician (Obstetrics and Gynecology) Henry Anes, OD [facility])  I have personally reviewed and noted the following in the patients chart:   Medical and social history Use of alcohol, tobacco or illicit drugs  Current medications and supplements including opioid prescriptions. Functional ability and status Nutritional status Physical activity Advanced  directives List of other physicians Hospitalizations, surgeries, and ER visits in previous 12 months Vitals Screenings to include cognitive, depression, and falls Referrals and appointments  No orders of the defined types were placed in this encounter.  In addition, I have reviewed and discussed with patient certain preventive protocols, quality metrics, and best practice recommendations. A written personalized care plan for preventive services as well as general preventive health recommendations were provided to patient.   Rojelio LELON Blush, LPN   [id number]   Return in 53 weeks (on 12/16/2025).  After Visit Summary: (In Person-Declined) Patient declined AVS at this time.  Nurse Notes: No voiced or noted concerns at this time "

## 2024-12-23 ENCOUNTER — Other Ambulatory Visit: Payer: Self-pay | Admitting: Family Medicine

## 2024-12-23 DIAGNOSIS — Z760 Encounter for issue of repeat prescription: Secondary | ICD-10-CM

## 2025-02-14 ENCOUNTER — Ambulatory Visit: Admitting: Dermatology

## 2025-12-16 ENCOUNTER — Ambulatory Visit
